# Patient Record
Sex: Female | Born: 1955 | Race: Black or African American | Hispanic: No | Smoking: Current every day smoker
Health system: Southern US, Community
[De-identification: ages and names within clinical notes are randomized; demographics above are authoritative.]

## PROBLEM LIST (undated history)

## (undated) DIAGNOSIS — F419 Anxiety disorder, unspecified: Secondary | ICD-10-CM

## (undated) DIAGNOSIS — M1990 Unspecified osteoarthritis, unspecified site: Secondary | ICD-10-CM

## (undated) DIAGNOSIS — M1611 Unilateral primary osteoarthritis, right hip: Secondary | ICD-10-CM

## (undated) DIAGNOSIS — F32A Depression, unspecified: Secondary | ICD-10-CM

## (undated) DIAGNOSIS — F1290 Cannabis use, unspecified, uncomplicated: Secondary | ICD-10-CM

## (undated) DIAGNOSIS — M25519 Pain in unspecified shoulder: Secondary | ICD-10-CM

## (undated) DIAGNOSIS — M25559 Pain in unspecified hip: Secondary | ICD-10-CM

## (undated) DIAGNOSIS — F1410 Cocaine abuse, uncomplicated: Secondary | ICD-10-CM

## (undated) DIAGNOSIS — R9431 Abnormal electrocardiogram [ECG] [EKG]: Secondary | ICD-10-CM

## (undated) DIAGNOSIS — J45909 Unspecified asthma, uncomplicated: Secondary | ICD-10-CM

## (undated) DIAGNOSIS — E43 Unspecified severe protein-calorie malnutrition: Secondary | ICD-10-CM

## (undated) DIAGNOSIS — K219 Gastro-esophageal reflux disease without esophagitis: Secondary | ICD-10-CM

## (undated) DIAGNOSIS — O009 Unspecified ectopic pregnancy without intrauterine pregnancy: Secondary | ICD-10-CM

## (undated) DIAGNOSIS — F17200 Nicotine dependence, unspecified, uncomplicated: Secondary | ICD-10-CM

## (undated) DIAGNOSIS — F319 Bipolar disorder, unspecified: Secondary | ICD-10-CM

## (undated) HISTORY — DX: Cannabis use, unspecified, uncomplicated: F12.90

## (undated) HISTORY — DX: Pain in unspecified shoulder: M25.519

## (undated) HISTORY — DX: Nicotine dependence, unspecified, uncomplicated: F17.200

## (undated) HISTORY — DX: Unspecified severe protein-calorie malnutrition: E43

## (undated) HISTORY — PX: ABDOMINAL SURGERY: SHX537

## (undated) HISTORY — PX: LAPAROSCOPY FOR ECTOPIC PREGNANCY: SUR765

## (undated) HISTORY — DX: Pain in unspecified hip: M25.559

## (undated) HISTORY — PX: TONSILLECTOMY: SUR1361

## (undated) HISTORY — PX: TUMOR REMOVAL: SHX12

## (undated) HISTORY — DX: Abnormal electrocardiogram (ECG) (EKG): R94.31

## (undated) HISTORY — DX: Cocaine abuse, uncomplicated: F14.10

---

## 1988-06-15 HISTORY — PX: TUMOR REMOVAL: SHX12

## 1998-01-03 ENCOUNTER — Other Ambulatory Visit: Admission: RE | Admit: 1998-01-03 | Discharge: 1998-01-03 | Payer: Self-pay | Admitting: Family Medicine

## 1998-07-23 ENCOUNTER — Encounter: Admission: RE | Admit: 1998-07-23 | Discharge: 1998-07-23 | Payer: Self-pay | Admitting: Obstetrics & Gynecology

## 1998-08-06 ENCOUNTER — Encounter: Admission: RE | Admit: 1998-08-06 | Discharge: 1998-08-06 | Payer: Self-pay | Admitting: Obstetrics & Gynecology

## 1998-09-23 ENCOUNTER — Emergency Department (HOSPITAL_COMMUNITY): Admission: EM | Admit: 1998-09-23 | Discharge: 1998-09-24 | Payer: Self-pay | Admitting: Emergency Medicine

## 1998-09-23 ENCOUNTER — Encounter: Payer: Self-pay | Admitting: Emergency Medicine

## 1998-09-26 ENCOUNTER — Emergency Department (HOSPITAL_COMMUNITY): Admission: EM | Admit: 1998-09-26 | Discharge: 1998-09-26 | Payer: Self-pay

## 1999-05-23 ENCOUNTER — Inpatient Hospital Stay (HOSPITAL_COMMUNITY): Admission: AD | Admit: 1999-05-23 | Discharge: 1999-05-30 | Payer: Self-pay

## 2000-07-26 ENCOUNTER — Emergency Department (HOSPITAL_COMMUNITY): Admission: EM | Admit: 2000-07-26 | Discharge: 2000-07-26 | Payer: Self-pay | Admitting: Emergency Medicine

## 2000-11-06 ENCOUNTER — Encounter: Payer: Self-pay | Admitting: Emergency Medicine

## 2000-11-06 ENCOUNTER — Emergency Department (HOSPITAL_COMMUNITY): Admission: EM | Admit: 2000-11-06 | Discharge: 2000-11-06 | Payer: Self-pay | Admitting: Emergency Medicine

## 2001-12-18 ENCOUNTER — Emergency Department (HOSPITAL_COMMUNITY): Admission: EM | Admit: 2001-12-18 | Discharge: 2001-12-18 | Payer: Self-pay | Admitting: Emergency Medicine

## 2001-12-19 ENCOUNTER — Encounter: Payer: Self-pay | Admitting: Emergency Medicine

## 2002-06-19 ENCOUNTER — Emergency Department (HOSPITAL_COMMUNITY): Admission: EM | Admit: 2002-06-19 | Discharge: 2002-06-19 | Payer: Self-pay | Admitting: Emergency Medicine

## 2002-06-19 ENCOUNTER — Encounter: Payer: Self-pay | Admitting: Emergency Medicine

## 2003-09-24 ENCOUNTER — Emergency Department (HOSPITAL_COMMUNITY): Admission: EM | Admit: 2003-09-24 | Discharge: 2003-09-24 | Payer: Self-pay | Admitting: Emergency Medicine

## 2004-07-07 ENCOUNTER — Ambulatory Visit: Payer: Self-pay | Admitting: Family Medicine

## 2005-07-30 ENCOUNTER — Ambulatory Visit: Payer: Self-pay | Admitting: Family Medicine

## 2005-08-03 ENCOUNTER — Ambulatory Visit: Payer: Self-pay | Admitting: Family Medicine

## 2005-08-05 ENCOUNTER — Ambulatory Visit (HOSPITAL_COMMUNITY): Admission: RE | Admit: 2005-08-05 | Discharge: 2005-08-05 | Payer: Self-pay | Admitting: Internal Medicine

## 2005-08-06 ENCOUNTER — Ambulatory Visit: Payer: Self-pay | Admitting: Family Medicine

## 2005-09-02 ENCOUNTER — Ambulatory Visit: Payer: Self-pay | Admitting: Family Medicine

## 2006-01-27 ENCOUNTER — Ambulatory Visit: Payer: Self-pay | Admitting: Family Medicine

## 2006-12-09 ENCOUNTER — Emergency Department (HOSPITAL_COMMUNITY): Admission: EM | Admit: 2006-12-09 | Discharge: 2006-12-09 | Payer: Self-pay | Admitting: Emergency Medicine

## 2006-12-23 ENCOUNTER — Encounter: Admission: RE | Admit: 2006-12-23 | Discharge: 2006-12-23 | Payer: Self-pay | Admitting: General Practice

## 2007-09-03 ENCOUNTER — Emergency Department (HOSPITAL_COMMUNITY): Admission: EM | Admit: 2007-09-03 | Discharge: 2007-09-03 | Payer: Self-pay | Admitting: Emergency Medicine

## 2007-09-15 ENCOUNTER — Emergency Department (HOSPITAL_COMMUNITY): Admission: EM | Admit: 2007-09-15 | Discharge: 2007-09-15 | Payer: Self-pay | Admitting: Emergency Medicine

## 2007-10-06 ENCOUNTER — Emergency Department (HOSPITAL_COMMUNITY): Admission: EM | Admit: 2007-10-06 | Discharge: 2007-10-06 | Payer: Self-pay | Admitting: Family Medicine

## 2007-10-23 ENCOUNTER — Emergency Department (HOSPITAL_COMMUNITY): Admission: EM | Admit: 2007-10-23 | Discharge: 2007-10-23 | Payer: Self-pay | Admitting: Emergency Medicine

## 2007-12-06 ENCOUNTER — Emergency Department (HOSPITAL_COMMUNITY): Admission: EM | Admit: 2007-12-06 | Discharge: 2007-12-06 | Payer: Self-pay | Admitting: Emergency Medicine

## 2008-01-09 ENCOUNTER — Emergency Department (HOSPITAL_COMMUNITY): Admission: EM | Admit: 2008-01-09 | Discharge: 2008-01-09 | Payer: Self-pay | Admitting: Emergency Medicine

## 2008-01-28 ENCOUNTER — Emergency Department (HOSPITAL_COMMUNITY): Admission: EM | Admit: 2008-01-28 | Discharge: 2008-01-28 | Payer: Self-pay | Admitting: Family Medicine

## 2008-10-10 ENCOUNTER — Emergency Department (HOSPITAL_COMMUNITY): Admission: EM | Admit: 2008-10-10 | Discharge: 2008-10-10 | Payer: Self-pay | Admitting: Emergency Medicine

## 2009-01-02 ENCOUNTER — Emergency Department (HOSPITAL_COMMUNITY): Admission: EM | Admit: 2009-01-02 | Discharge: 2009-01-02 | Payer: Self-pay | Admitting: Emergency Medicine

## 2009-01-30 ENCOUNTER — Emergency Department (HOSPITAL_COMMUNITY): Admission: EM | Admit: 2009-01-30 | Discharge: 2009-01-30 | Payer: Self-pay | Admitting: Family Medicine

## 2009-05-06 ENCOUNTER — Emergency Department (HOSPITAL_COMMUNITY): Admission: EM | Admit: 2009-05-06 | Discharge: 2009-05-07 | Payer: Self-pay | Admitting: Emergency Medicine

## 2009-06-21 ENCOUNTER — Emergency Department (HOSPITAL_COMMUNITY): Admission: EM | Admit: 2009-06-21 | Discharge: 2009-06-21 | Payer: Self-pay | Admitting: Emergency Medicine

## 2009-07-08 ENCOUNTER — Emergency Department (HOSPITAL_COMMUNITY): Admission: EM | Admit: 2009-07-08 | Discharge: 2009-07-08 | Payer: Self-pay | Admitting: Emergency Medicine

## 2009-11-17 ENCOUNTER — Other Ambulatory Visit: Payer: Self-pay | Admitting: Emergency Medicine

## 2009-11-17 ENCOUNTER — Ambulatory Visit: Payer: Self-pay | Admitting: Psychiatry

## 2009-11-17 ENCOUNTER — Inpatient Hospital Stay (HOSPITAL_COMMUNITY): Admission: RE | Admit: 2009-11-17 | Discharge: 2009-11-25 | Payer: Self-pay | Admitting: Psychiatry

## 2009-11-18 ENCOUNTER — Encounter: Payer: Self-pay | Admitting: Emergency Medicine

## 2010-03-19 ENCOUNTER — Ambulatory Visit (HOSPITAL_COMMUNITY): Admission: RE | Admit: 2010-03-19 | Discharge: 2010-03-19 | Payer: Self-pay | Admitting: Internal Medicine

## 2010-04-23 IMAGING — CR DG CERVICAL SPINE COMPLETE 4+V
7 series · 7 of 7 positions shown · non-contrast
Comparison: None

CLINICAL DATA: Fall 3 days ago.

CERVICAL SPINE - COMPLETE 4+ VIEW

[view not recorded (1 of 7)]
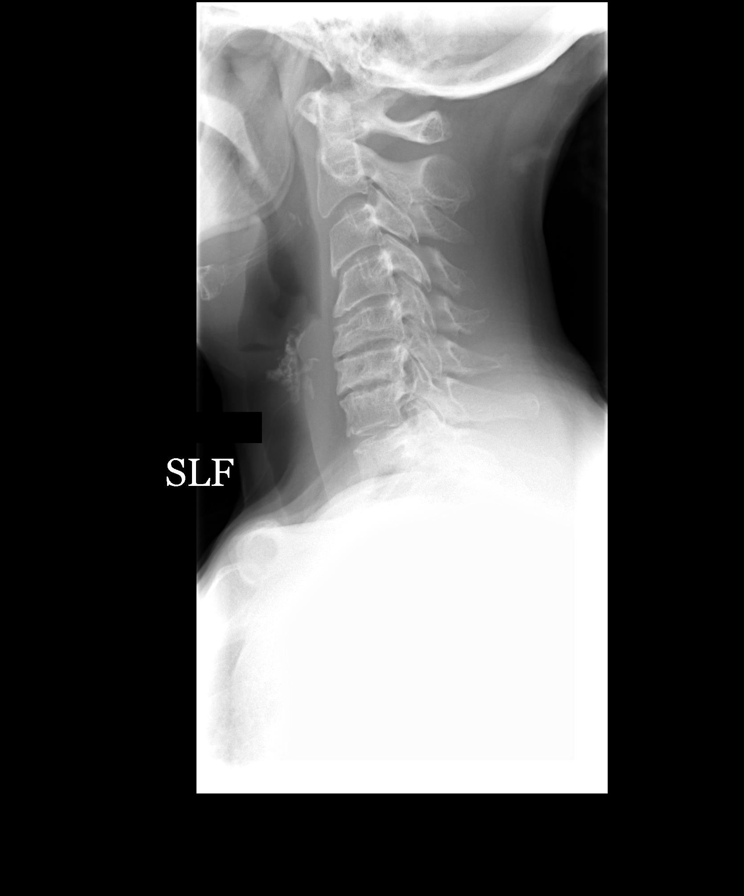

[view not recorded (2 of 7)]
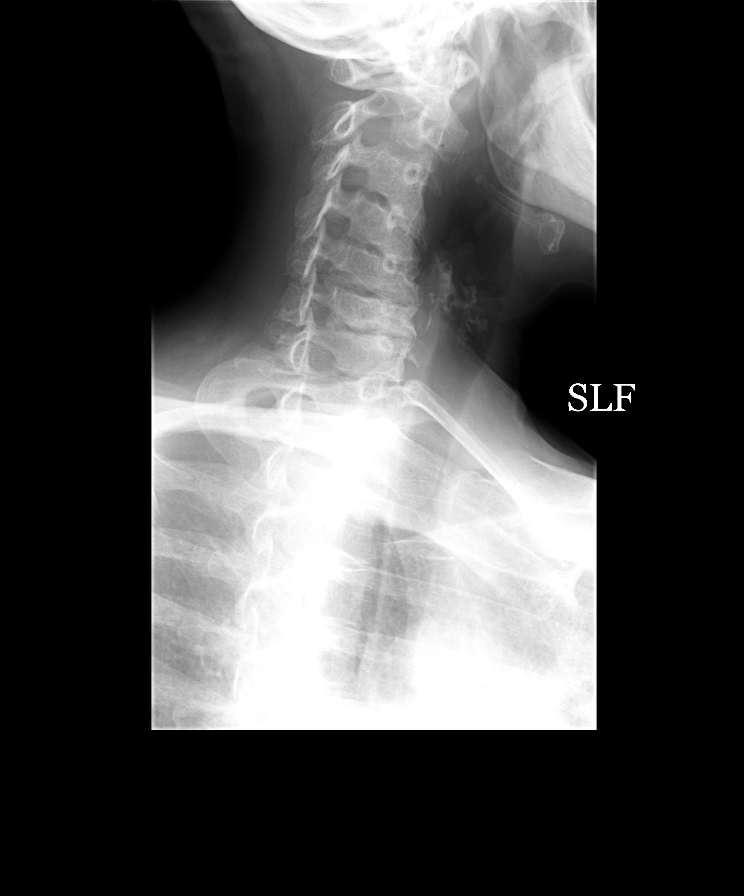

[view not recorded (3 of 7)]
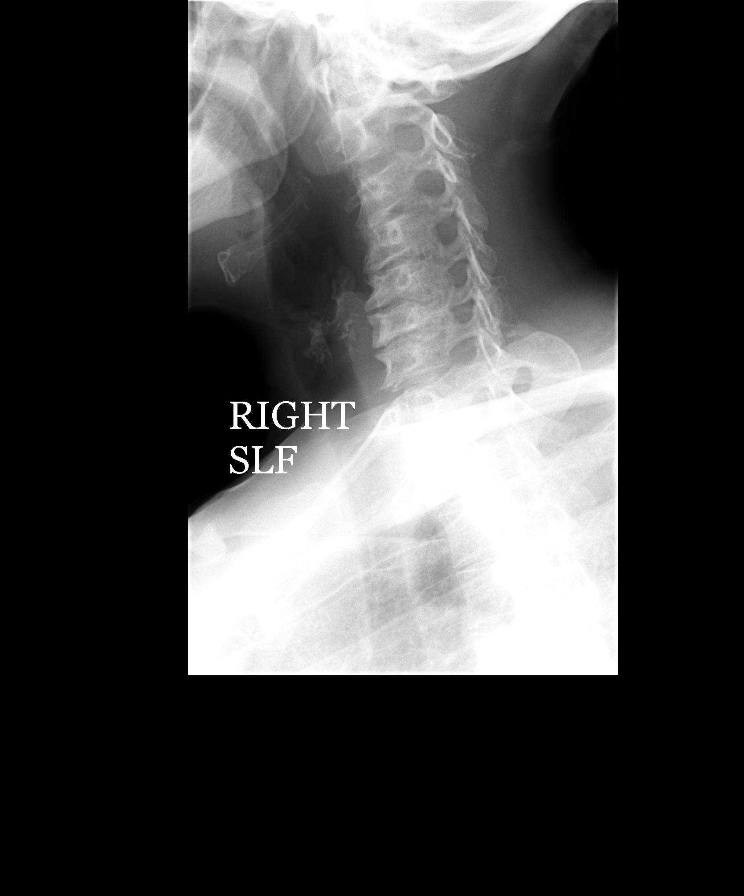

[view not recorded (4 of 7)]
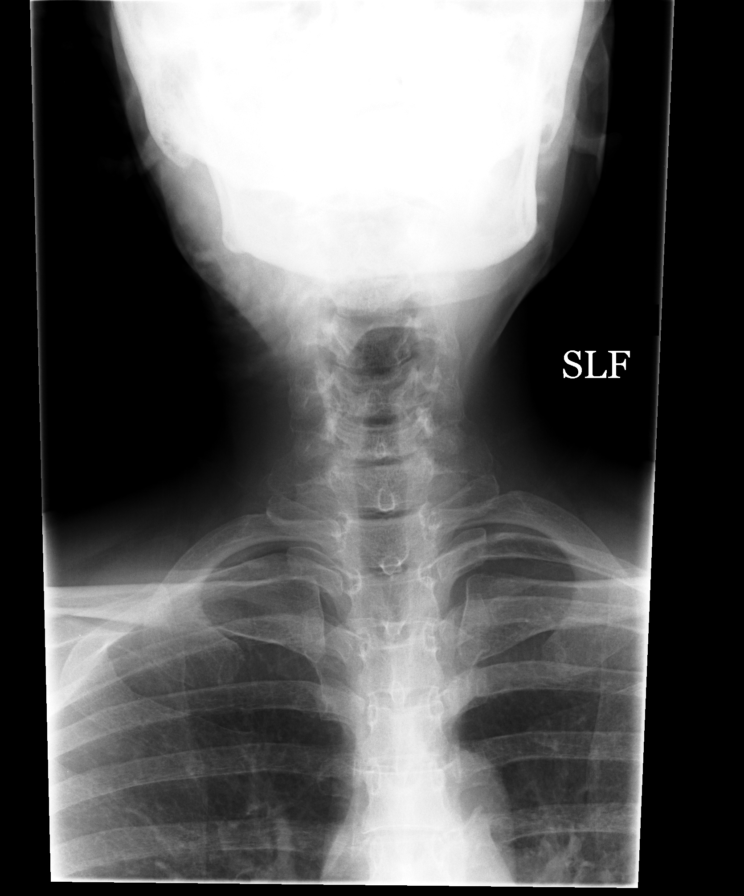

[view not recorded (5 of 7)]
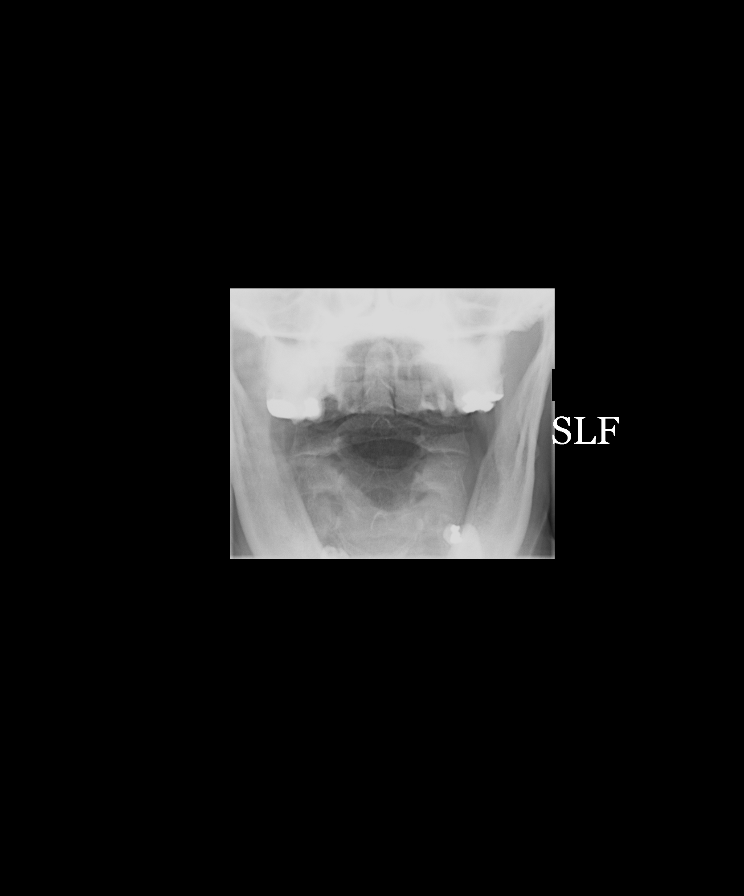

[view not recorded (6 of 7)]
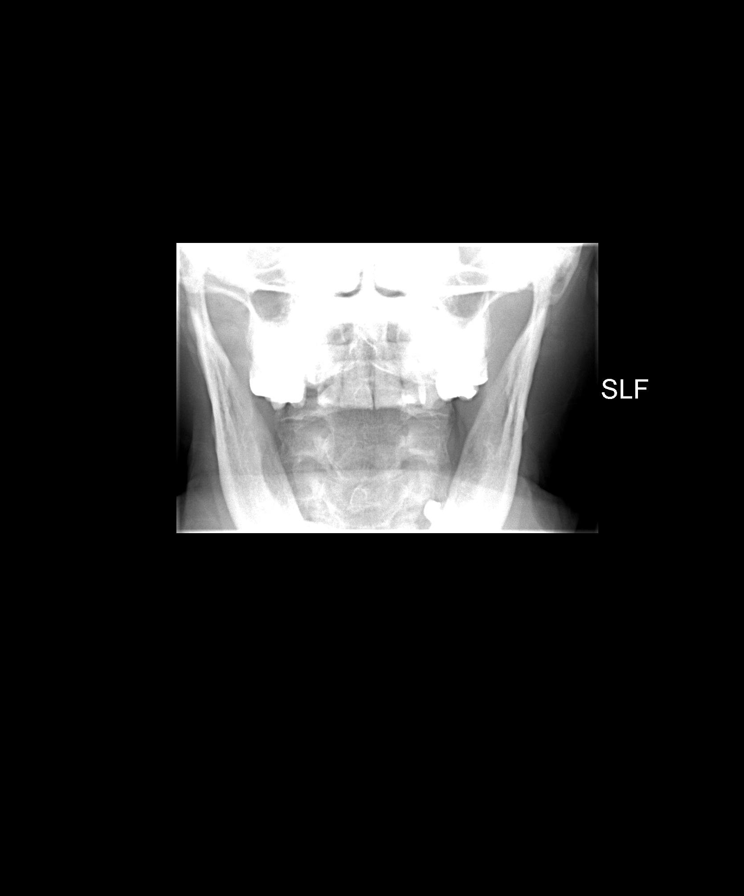

[view not recorded (7 of 7)]
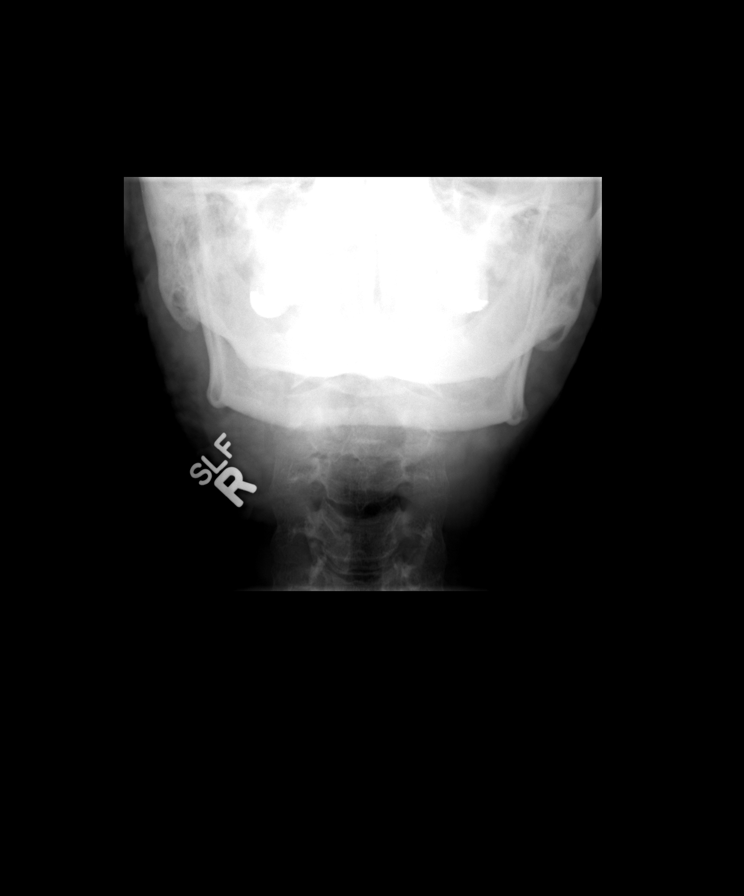

[7 of 7 positions shown; findings below may reference images not displayed]

FINDINGS: Negative for fracture.  There is disc degeneration and
spondylosis most notable at C5-6 and C6-7 and milder at C3-4 and C4-
5.  There is mild foraminal narrowing bilaterally at C5-6 and C6-7
due to spurring.
IMPRESSION: Negative for fracture.  Moderate cervical spondylosis.

## 2010-07-06 ENCOUNTER — Encounter: Payer: Self-pay | Admitting: General Practice

## 2010-07-17 ENCOUNTER — Emergency Department (HOSPITAL_COMMUNITY)
Admission: EM | Admit: 2010-07-17 | Discharge: 2010-07-17 | Disposition: A | Discharge status: Home / Self Care | Payer: Medicare Other | Attending: Emergency Medicine | Admitting: Emergency Medicine

## 2010-07-17 DIAGNOSIS — F341 Dysthymic disorder: Secondary | ICD-10-CM | POA: Insufficient documentation

## 2010-07-17 DIAGNOSIS — Z79899 Other long term (current) drug therapy: Secondary | ICD-10-CM | POA: Insufficient documentation

## 2010-07-17 DIAGNOSIS — Z760 Encounter for issue of repeat prescription: Secondary | ICD-10-CM | POA: Insufficient documentation

## 2010-07-17 DIAGNOSIS — F319 Bipolar disorder, unspecified: Secondary | ICD-10-CM | POA: Insufficient documentation

## 2010-08-30 LAB — COMPREHENSIVE METABOLIC PANEL
ALT: 13 U/L (ref 0–35)
AST: 20 U/L (ref 0–37)
Albumin: 3.4 g/dL — ABNORMAL LOW (ref 3.5–5.2)
Alkaline Phosphatase: 66 U/L (ref 39–117)
BUN: 7 mg/dL (ref 6–23)
CO2: 30 mEq/L (ref 19–32)
Calcium: 9 mg/dL (ref 8.4–10.5)
Chloride: 104 mEq/L (ref 96–112)
Creatinine, Ser: 0.91 mg/dL (ref 0.4–1.2)
GFR calc Af Amer: >60 mL/min (ref >60)
GFR calc non Af Amer: >60 mL/min (ref >60)
Glucose, Bld: 93 mg/dL (ref 70–99)
Potassium: 3.6 mEq/L (ref 3.5–5.1)
Sodium: 140 mEq/L (ref 135–145)
Total Bilirubin: 0.4 mg/dL (ref 0.3–1.2)
Total Protein: 6.6 g/dL (ref 6.0–8.3)

## 2010-08-30 LAB — URINE MICROSCOPIC-ADD ON

## 2010-08-30 LAB — CBC
HCT: 39.5 % (ref 36.0–46.0)
Hemoglobin: 13.5 g/dL (ref 12.0–15.0)
MCHC: 34.2 g/dL (ref 30.0–36.0)
MCV: 95.4 fL (ref 78.0–100.0)
Platelets: 184 10*3/uL (ref 150–400)
RBC: 4.13 MIL/uL (ref 3.87–5.11)
RDW: 14.5 % (ref 11.5–15.5)
WBC: 3.8 10*3/uL — ABNORMAL LOW (ref 4.0–10.5)

## 2010-08-30 LAB — URINALYSIS, ROUTINE W REFLEX MICROSCOPIC
Bilirubin Urine: NEGATIVE
Glucose, UA: NEGATIVE mg/dL
Ketones, ur: NEGATIVE mg/dL
Leukocytes, UA: NEGATIVE
Nitrite: NEGATIVE
Protein, ur: NEGATIVE mg/dL
Specific Gravity, Urine: 1.013 (ref 1.005–1.030)
Urobilinogen, UA: 1 mg/dL (ref 0.0–1.0)
pH: 6 (ref 5.0–8.0)

## 2010-08-30 LAB — RAPID URINE DRUG SCREEN, HOSP PERFORMED
Amphetamines: NOT DETECTED
Barbiturates: NOT DETECTED
Benzodiazepines: NOT DETECTED
Cocaine: POSITIVE — AB
Opiates: NOT DETECTED
Tetrahydrocannabinol: NOT DETECTED

## 2010-08-30 LAB — DIFFERENTIAL
Basophils Absolute: 0 10*3/uL (ref 0.0–0.1)
Basophils Relative: 1 % (ref 0–1)
Eosinophils Absolute: 0 10*3/uL (ref 0.0–0.7)
Eosinophils Relative: 1 % (ref 0–5)
Lymphocytes Relative: 33 % (ref 12–46)
Lymphs Abs: 1.2 10*3/uL (ref 0.7–4.0)
Monocytes Absolute: 0.3 10*3/uL (ref 0.1–1.0)
Monocytes Relative: 9 % (ref 3–12)
Neutro Abs: 2.1 10*3/uL (ref 1.7–7.7)
Neutrophils Relative %: 56 % (ref 43–77)

## 2010-08-30 LAB — ETHANOL: Alcohol, Ethyl (B): <5 mg/dL (ref 0–10)

## 2010-08-31 LAB — URINALYSIS, ROUTINE W REFLEX MICROSCOPIC
Glucose, UA: NEGATIVE mg/dL
Hgb urine dipstick: NEGATIVE
Ketones, ur: 15 mg/dL — AB
Nitrite: NEGATIVE
Protein, ur: NEGATIVE mg/dL
Specific Gravity, Urine: 1.031 — ABNORMAL HIGH (ref 1.005–1.030)
Urobilinogen, UA: 1 mg/dL (ref 0.0–1.0)
pH: 6 (ref 5.0–8.0)

## 2010-08-31 LAB — WET PREP, GENITAL
Trich, Wet Prep: NONE SEEN
Yeast Wet Prep HPF POC: NONE SEEN

## 2010-08-31 LAB — GC/CHLAMYDIA PROBE AMP, GENITAL
Chlamydia, DNA Probe: NEGATIVE
GC Probe Amp, Genital: POSITIVE — AB

## 2010-09-01 LAB — POCT I-STAT, CHEM 8
BUN: 16 mg/dL (ref 6–23)
Calcium, Ion: 1.12 mmol/L (ref 1.12–1.32)
Chloride: 109 mEq/L (ref 96–112)
Creatinine, Ser: 0.9 mg/dL (ref 0.4–1.2)
Glucose, Bld: 96 mg/dL (ref 70–99)
HCT: 43 % (ref 36.0–46.0)
Hemoglobin: 14.6 g/dL (ref 12.0–15.0)
Potassium: 4.2 mEq/L (ref 3.5–5.1)
Sodium: 140 mEq/L (ref 135–145)
TCO2: 25 mmol/L (ref 0–100)

## 2010-09-01 LAB — ETHANOL: Alcohol, Ethyl (B): <5 mg/dL (ref 0–10)

## 2010-09-01 LAB — URINALYSIS, ROUTINE W REFLEX MICROSCOPIC
Bilirubin Urine: NEGATIVE
Glucose, UA: NEGATIVE mg/dL
Hgb urine dipstick: NEGATIVE
Ketones, ur: NEGATIVE mg/dL
Nitrite: NEGATIVE
Protein, ur: NEGATIVE mg/dL
Specific Gravity, Urine: 1.028 (ref 1.005–1.030)
Urobilinogen, UA: 0.2 mg/dL (ref 0.0–1.0)
pH: 5.5 (ref 5.0–8.0)

## 2010-09-01 LAB — DIFFERENTIAL
Basophils Absolute: 0 10*3/uL (ref 0.0–0.1)
Basophils Absolute: 0 10*3/uL (ref 0.0–0.1)
Basophils Relative: 1 % (ref 0–1)
Basophils Relative: 1 % (ref 0–1)
Eosinophils Absolute: 0.1 10*3/uL (ref 0.0–0.7)
Eosinophils Absolute: 0.1 10*3/uL (ref 0.0–0.7)
Eosinophils Relative: 2 % (ref 0–5)
Eosinophils Relative: 2 % (ref 0–5)
Lymphocytes Relative: 42 % (ref 12–46)
Lymphocytes Relative: 49 % — ABNORMAL HIGH (ref 12–46)
Lymphs Abs: 1.7 10*3/uL (ref 0.7–4.0)
Lymphs Abs: 1.8 10*3/uL (ref 0.7–4.0)
Monocytes Absolute: 0.3 10*3/uL (ref 0.1–1.0)
Monocytes Absolute: 0.4 10*3/uL (ref 0.1–1.0)
Monocytes Relative: 9 % (ref 3–12)
Monocytes Relative: 9 % (ref 3–12)
Neutro Abs: 1.5 10*3/uL — ABNORMAL LOW (ref 1.7–7.7)
Neutro Abs: 2 10*3/uL (ref 1.7–7.7)
Neutrophils Relative %: 39 % — ABNORMAL LOW (ref 43–77)
Neutrophils Relative %: 47 % (ref 43–77)

## 2010-09-01 LAB — COMPREHENSIVE METABOLIC PANEL
ALT: 16 U/L (ref 0–35)
AST: 23 U/L (ref 0–37)
Albumin: 3.5 g/dL (ref 3.5–5.2)
Alkaline Phosphatase: 66 U/L (ref 39–117)
BUN: 17 mg/dL (ref 6–23)
CO2: 29 mEq/L (ref 19–32)
Calcium: 9.1 mg/dL (ref 8.4–10.5)
Chloride: 109 mEq/L (ref 96–112)
Creatinine, Ser: 1.08 mg/dL (ref 0.4–1.2)
GFR calc Af Amer: >60 mL/min (ref >60)
GFR calc non Af Amer: 53 mL/min — ABNORMAL LOW (ref >60)
Glucose, Bld: 88 mg/dL (ref 70–99)
Potassium: 3.7 mEq/L (ref 3.5–5.1)
Sodium: 143 mEq/L (ref 135–145)
Total Bilirubin: 0.3 mg/dL (ref 0.3–1.2)
Total Protein: 6.6 g/dL (ref 6.0–8.3)

## 2010-09-01 LAB — D-DIMER, QUANTITATIVE: D-Dimer, Quant: 0.28 ug/mL-FEU (ref 0.00–0.48)

## 2010-09-01 LAB — CBC
HCT: 40.9 % (ref 36.0–46.0)
HCT: 41.4 % (ref 36.0–46.0)
Hemoglobin: 13.7 g/dL (ref 12.0–15.0)
Hemoglobin: 14.2 g/dL (ref 12.0–15.0)
MCHC: 33.4 g/dL (ref 30.0–36.0)
MCHC: 34.4 g/dL (ref 30.0–36.0)
MCV: 96.5 fL (ref 78.0–100.0)
MCV: 96.8 fL (ref 78.0–100.0)
Platelets: 174 10*3/uL (ref 150–400)
Platelets: 184 10*3/uL (ref 150–400)
RBC: 4.23 MIL/uL (ref 3.87–5.11)
RBC: 4.29 MIL/uL (ref 3.87–5.11)
RDW: 14.3 % (ref 11.5–15.5)
RDW: 14.7 % (ref 11.5–15.5)
WBC: 3.7 10*3/uL — ABNORMAL LOW (ref 4.0–10.5)
WBC: 4.2 10*3/uL (ref 4.0–10.5)

## 2010-09-01 LAB — URINE MICROSCOPIC-ADD ON

## 2010-09-01 LAB — URINE CULTURE
Colony Count: 25000
Special Requests: NEGATIVE

## 2010-09-01 LAB — RAPID URINE DRUG SCREEN, HOSP PERFORMED
Amphetamines: NOT DETECTED
Barbiturates: NOT DETECTED
Benzodiazepines: NOT DETECTED
Cocaine: POSITIVE — AB
Opiates: NOT DETECTED
Tetrahydrocannabinol: NOT DETECTED

## 2010-09-01 LAB — POCT CARDIAC MARKERS
CKMB, poc: <1 ng/mL — ABNORMAL LOW (ref 1.0–8.0)
Myoglobin, poc: 38.5 ng/mL (ref 12–200)
Troponin i, poc: <0.05 ng/mL (ref 0.00–0.09)

## 2010-09-01 LAB — GC/CHLAMYDIA PROBE AMP, URINE
Chlamydia, Swab/Urine, PCR: NEGATIVE
GC Probe Amp, Urine: NEGATIVE

## 2010-09-17 LAB — RAPID URINE DRUG SCREEN, HOSP PERFORMED
Amphetamines: NOT DETECTED
Barbiturates: NOT DETECTED
Benzodiazepines: NOT DETECTED
Cocaine: POSITIVE — AB
Opiates: NOT DETECTED
Tetrahydrocannabinol: NOT DETECTED

## 2010-09-17 LAB — BASIC METABOLIC PANEL
BUN: 10 mg/dL (ref 6–23)
CO2: 28 mEq/L (ref 19–32)
Chloride: 107 mEq/L (ref 96–112)
Potassium: 3.7 mEq/L (ref 3.5–5.1)

## 2010-09-17 LAB — BASIC METABOLIC PANEL WITH GFR
Calcium: 9.1 mg/dL (ref 8.4–10.5)
Creatinine, Ser: 0.83 mg/dL (ref 0.4–1.2)
GFR calc Af Amer: >60 mL/min (ref >60)
GFR calc non Af Amer: >60 mL/min (ref >60)
Glucose, Bld: 85 mg/dL (ref 70–99)
Sodium: 141 meq/L (ref 135–145)

## 2010-09-17 LAB — CBC
HCT: 38.6 % (ref 36.0–46.0)
Hemoglobin: 13.1 g/dL (ref 12.0–15.0)
MCHC: 34 g/dL (ref 30.0–36.0)
MCV: 95.1 fL (ref 78.0–100.0)
Platelets: 199 10*3/uL (ref 150–400)
RBC: 4.06 MIL/uL (ref 3.87–5.11)
RDW: 14 % (ref 11.5–15.5)
WBC: 4.6 10*3/uL (ref 4.0–10.5)

## 2010-09-17 LAB — DIFFERENTIAL
Basophils Absolute: 0 K/uL (ref 0.0–0.1)
Basophils Relative: 1 % (ref 0–1)
Eosinophils Absolute: 0.1 10*3/uL (ref 0.0–0.7)
Eosinophils Relative: 1 % (ref 0–5)
Lymphocytes Relative: 46 % (ref 12–46)
Lymphs Abs: 2.1 10*3/uL (ref 0.7–4.0)
Monocytes Absolute: 0.3 K/uL (ref 0.1–1.0)
Monocytes Relative: 8 % (ref 3–12)
Neutro Abs: 2 K/uL (ref 1.7–7.7)
Neutrophils Relative %: 45 % (ref 43–77)

## 2010-09-17 LAB — ETHANOL: Alcohol, Ethyl (B): <5 mg/dL (ref 0–10)

## 2010-09-24 LAB — URINALYSIS, ROUTINE W REFLEX MICROSCOPIC
Nitrite: NEGATIVE
Specific Gravity, Urine: 1.023 (ref 1.005–1.030)
Urobilinogen, UA: 1 mg/dL (ref 0.0–1.0)

## 2010-09-24 LAB — POCT I-STAT, CHEM 8
Chloride: 109 mEq/L (ref 96–112)
Creatinine, Ser: 1.2 mg/dL (ref 0.4–1.2)
Glucose, Bld: 76 mg/dL (ref 70–99)
HCT: 41 % (ref 36.0–46.0)
Potassium: 3.7 mEq/L (ref 3.5–5.1)

## 2010-09-24 LAB — DIFFERENTIAL
Basophils Relative: 1 % (ref 0–1)
Lymphocytes Relative: 45 % (ref 12–46)
Lymphs Abs: 2 10*3/uL (ref 0.7–4.0)
Monocytes Relative: 10 % (ref 3–12)
Neutro Abs: 2 10*3/uL (ref 1.7–7.7)
Neutrophils Relative %: 44 % (ref 43–77)

## 2010-09-24 LAB — CBC
RBC: 4.14 MIL/uL (ref 3.87–5.11)
WBC: 4.5 10*3/uL (ref 4.0–10.5)

## 2011-08-29 ENCOUNTER — Emergency Department (HOSPITAL_COMMUNITY)
Admission: EM | Admit: 2011-08-29 | Discharge: 2011-08-29 | Disposition: A | Discharge status: Home / Self Care | Payer: Medicare Other | Attending: Emergency Medicine | Admitting: Emergency Medicine

## 2011-08-29 ENCOUNTER — Encounter (HOSPITAL_COMMUNITY): Payer: Self-pay | Admitting: *Deleted

## 2011-08-29 DIAGNOSIS — F17200 Nicotine dependence, unspecified, uncomplicated: Secondary | ICD-10-CM | POA: Insufficient documentation

## 2011-08-29 DIAGNOSIS — N390 Urinary tract infection, site not specified: Secondary | ICD-10-CM

## 2011-08-29 DIAGNOSIS — H6690 Otitis media, unspecified, unspecified ear: Secondary | ICD-10-CM

## 2011-08-29 LAB — URINALYSIS, ROUTINE W REFLEX MICROSCOPIC
Nitrite: POSITIVE — AB
Specific Gravity, Urine: 1.02 (ref 1.005–1.030)
pH: 6.5 (ref 5.0–8.0)

## 2011-08-29 LAB — URINE MICROSCOPIC-ADD ON

## 2011-08-29 MED ORDER — SULFAMETHOXAZOLE-TRIMETHOPRIM 800-160 MG PO TABS: 1.0000 | ORAL_TABLET | Freq: Two times a day (BID) | ORAL | Status: AC

## 2011-08-29 MED ORDER — HYDROCODONE-ACETAMINOPHEN 5-500 MG PO TABS: 1.0000 | ORAL_TABLET | Freq: Four times a day (QID) | ORAL | Status: AC | PRN

## 2011-08-29 NOTE — ED Provider Notes (Signed)
History     CSN: [id number]  Arrival date & time 08/29/11  1605   First MD Initiated Contact with Patient 08/29/11 1756      Chief Complaint  Patient presents with  . Otalgia  . Fever  . Hematuria    (Consider location/radiation/quality/duration/timing/severity/associated sxs/prior treatment) HPI Comments: Also complains of burning with urination, blood in urine, and lower abdominal pain.  Thinks she may have a uti.  Patient is a 56 y.o. female presenting with ear pain. The history is provided by the patient.  Otalgia This is a new problem. The current episode started 2 days ago. There is pain in the left ear. The problem occurs constantly. The problem has been gradually worsening. There has been no fever.    History reviewed. No pertinent past medical history.  History reviewed. No pertinent past surgical history.  History reviewed. No pertinent family history.  History  Substance Use Topics  . Smoking status: Current Everyday Smoker    Types: Cigarettes, Cigars  . Smokeless tobacco: Not on file  . Alcohol Use: Yes     occ    OB History    Grav Para Term Preterm Abortions TAB SAB Ect Mult Living                  Review of Systems  HENT: Positive for ear pain.   All other systems reviewed and are negative.    Allergies  Review of patient's allergies indicates no known allergies.  Home Medications   Current Outpatient Rx  Name Route Sig Dispense Refill  . CLONAZEPAM 0.5 MG PO TABS Oral Take 0.5 mg by mouth 2 (two) times daily.    Marland Kitchen GABAPENTIN 300 MG PO CAPS Oral Take 300 mg by mouth 3 (three) times daily.    Marland Kitchen OXCARBAZEPINE 300 MG PO TABS Oral Take 300 mg by mouth 3 (three) times daily.    Marland Kitchen RISPERIDONE 1 MG PO TABS Oral Take 1 mg by mouth 2 (two) times daily.      BP 121/79  Pulse 77  Temp(Src) 98.1 F (36.7 C) (Oral)  Resp 16  SpO2 97%  Physical Exam  Nursing note and vitals reviewed. Constitutional: She is oriented to person, place, and time.  She appears well-developed and well-nourished. No distress.  HENT:  Head: Normocephalic and atraumatic.       The left tm is erythematous and bulging.    Neck: Normal range of motion. Neck supple.  Cardiovascular: Normal rate and regular rhythm.  Exam reveals no gallop and no friction rub.   No murmur heard. Pulmonary/Chest: Effort normal and breath sounds normal. No respiratory distress. She has no wheezes.  Abdominal: Soft. Bowel sounds are normal. She exhibits no distension.       There is mild ttp in the suprapubic region with no rebound or guarding.  Musculoskeletal: Normal range of motion.  Neurological: She is alert and oriented to person, place, and time.  Skin: Skin is warm and dry. She is not diaphoretic.    ED Course  Procedures (including critical care time)   Labs Reviewed  URINALYSIS, ROUTINE W REFLEX MICROSCOPIC   No results found.   No diagnosis found.    MDM  The patient has both an otitis media and uti.  I will treat her with bactrim.  To return prn if worsens.          Geoffery Lyons, MD 08/29/11 [phone]

## 2011-08-29 NOTE — Discharge Instructions (Signed)
Otitis Media, Adult A middle ear infection is an infection in the space behind the eardrum. The medical name for this is "otitis media." It may happen after a common cold. It is caused by a germ that starts growing in that space. You may feel swollen glands in your neck on the side of the ear infection. HOME CARE INSTRUCTIONS   Take your medicine as directed until it is gone, even if you feel better after the first few days.   Only take over-the-counter or prescription medicines for pain, discomfort, or fever as directed by your caregiver.   Occasional use of a nasal decongestant a couple times per day may help with discomfort and help the eustachian tube to drain better.  Follow up with your caregiver in 10 to 14 days or as directed, to be certain that the infection has cleared. Not keeping the appointment could result in a chronic or permanent injury, pain, hearing loss and disability. If there is any problem keeping the appointment, you must call back to this facility for assistance. SEEK IMMEDIATE MEDICAL CARE IF:   You are not getting better in 2 to 3 days.   You have pain that is not controlled with medication.   You feel worse instead of better.   You cannot use the medication as directed.   You develop swelling, redness or pain around the ear or stiffness in your neck.  MAKE SURE YOU:   Understand these instructions.   Will watch your condition.   Will get help right away if you are not doing well or get worse.  Document Released: 03/06/2004 Document Revised: 05/21/2011 Document Reviewed: 01/06/2008 [facility] Patient Information 2012 [place], [place].Urinary Tract Infection Infections of the urinary tract can start in several places. A bladder infection (cystitis), a kidney infection (pyelonephritis), and a prostate infection (prostatitis) are different types of urinary tract infections (UTIs). They usually get better if treated with medicines (antibiotics) that kill germs. Take  all the medicine until it is gone. You or your child may feel better in a few days, but TAKE ALL MEDICINE or the infection may not respond and may become more difficult to treat. HOME CARE INSTRUCTIONS   Drink enough water and fluids to keep the urine clear or pale yellow. Cranberry juice is especially recommended, in addition to large amounts of water.   Avoid caffeine, tea, and carbonated beverages. They tend to irritate the bladder.   Alcohol may irritate the prostate.   Only take over-the-counter or prescription medicines for pain, discomfort, or fever as directed by your caregiver.  To prevent further infections:  Empty the bladder often. Avoid holding urine for long periods of time.   After a bowel movement, women should cleanse from front to back. Use each tissue only once.   Empty the bladder before and after sexual intercourse.  FINDING OUT THE RESULTS OF YOUR TEST Not all test results are available during your visit. If your or your child's test results are not back during the visit, make an appointment with your caregiver to find out the results. Do not assume everything is normal if you have not heard from your caregiver or the medical facility. It is important for you to follow up on all test results. SEEK MEDICAL CARE IF:   There is back pain.   Your baby is older than 3 months with a rectal temperature of 100.5 F (38.1 C) or higher for more than 1 day.   Your or your child's problems (symptoms)  are no better in 3 days. Return sooner if you or your child is getting worse.  SEEK IMMEDIATE MEDICAL CARE IF:   There is severe back pain or lower abdominal pain.   You or your child develops chills.   You have a fever.   Your baby is older than 3 months with a rectal temperature of 102 F (38.9 C) or higher.   Your baby is 93 months old or younger with a rectal temperature of 100.4 F (38 C) or higher.   There is nausea or vomiting.   There is continued burning or  discomfort with urination.  MAKE SURE YOU:   Understand these instructions.   Will watch your condition.   Will get help right away if you are not doing well or get worse.  Document Released: 03/11/2005 Document Revised: 05/21/2011 Document Reviewed: 10/14/2006 [facility] Patient Information 2012 [place], [place].

## 2011-08-29 NOTE — ED Notes (Signed)
Reports having fever, pain and ringing in both ears. Having blood when urinating, reports large clots and severe pain, unsure if blood is from bladder or vagina, reports no appetite today but denies n/vd.  No acute distress noted at triage.

## 2011-08-29 NOTE — ED Notes (Signed)
Pt complains of pain in head and ears, noted blood in urine and passing clots. ringignoted in ears no appetite but no n/v/d

## 2011-11-27 ENCOUNTER — Other Ambulatory Visit (HOSPITAL_COMMUNITY): Payer: Self-pay | Admitting: Internal Medicine

## 2011-11-27 DIAGNOSIS — Z1231 Encounter for screening mammogram for malignant neoplasm of breast: Secondary | ICD-10-CM

## 2011-12-22 ENCOUNTER — Ambulatory Visit (HOSPITAL_COMMUNITY): Payer: Medicare Other | Attending: Internal Medicine

## 2013-10-17 ENCOUNTER — Other Ambulatory Visit (HOSPITAL_COMMUNITY): Payer: Self-pay | Admitting: Nurse Practitioner

## 2013-10-17 DIAGNOSIS — Z1231 Encounter for screening mammogram for malignant neoplasm of breast: Secondary | ICD-10-CM

## 2013-10-20 ENCOUNTER — Ambulatory Visit (HOSPITAL_COMMUNITY): Admission: RE | Admit: 2013-10-20 | Payer: Medicare Other | Source: Ambulatory Visit

## 2013-11-08 ENCOUNTER — Encounter (HOSPITAL_COMMUNITY): Payer: Self-pay | Admitting: Emergency Medicine

## 2013-11-08 ENCOUNTER — Emergency Department (HOSPITAL_COMMUNITY)
Admission: EM | Admit: 2013-11-08 | Discharge: 2013-11-08 | Disposition: A | Discharge status: Home / Self Care | Payer: Medicare Other | Attending: Emergency Medicine | Admitting: Emergency Medicine

## 2013-11-08 DIAGNOSIS — H6690 Otitis media, unspecified, unspecified ear: Secondary | ICD-10-CM

## 2013-11-08 DIAGNOSIS — Z79899 Other long term (current) drug therapy: Secondary | ICD-10-CM | POA: Insufficient documentation

## 2013-11-08 DIAGNOSIS — F17200 Nicotine dependence, unspecified, uncomplicated: Secondary | ICD-10-CM | POA: Insufficient documentation

## 2013-11-08 HISTORY — DX: Unspecified ectopic pregnancy without intrauterine pregnancy: O00.90

## 2013-11-08 MED ORDER — AMOXICILLIN 500 MG PO CAPS: 500.0000 mg | ORAL_CAPSULE | Freq: Three times a day (TID) | ORAL | Status: DC

## 2013-11-08 NOTE — ED Notes (Signed)
Pt c/o bilateral ear pain X 2 days, reports hx of ear infections. sts it started as itchiness and then started to hurt. Pt sts yesterday she woke up in a sweat. Denies checking temperature at home. Nad, skin warm and dry, resp e/u.

## 2013-11-08 NOTE — Discharge Instructions (Signed)
Take Amoxicillin as directed until gone. Refer to attached documents for more information.

## 2013-11-08 NOTE — ED Provider Notes (Signed)
Medical screening examination/treatment/procedure(s) were performed by non-physician practitioner and as supervising physician I was immediately available for consultation/collaboration.   EKG Interpretation None        Dagmar Hait, MD 11/08/13 [phone]

## 2013-11-08 NOTE — ED Provider Notes (Signed)
CSN: [id number]     Arrival date & time 11/08/13  1145 History  This chart was scribed for non-physician practitioner Emilia Beck, PA-C working with Dagmar Hait, MD by Dorothey Baseman, ED Scribe. This patient was seen in room TR10C/TR10C and the patient's care was started at 12:19 PM.    Chief Complaint  Patient presents with  . Otalgia   Patient is a 58 y.o. female presenting with ear pain. The history is provided by the patient. No language interpreter was used.  Otalgia Location:  Bilateral Severity:  Moderate Onset quality:  Gradual Timing:  Constant Progression:  Worsening Chronicity:  Recurrent Relieved by:  Nothing Ineffective treatments:  OTC medications (aspirin) Associated symptoms: fever (subjective, resolved)   Fever:    Timing:  Intermittent   Temp source:  Subjective   Progression:  Resolved  HPI Comments: Terry Potter is a 58 y.o. Female with a history of ear infections who presents to the Emergency Department complaining of a constant pain to the bilateral ears onset 2 days ago. She states that her ears started out being itchy, but progressively worsened to become painful. Patient reports an associated, subjective fever (patient is afebrile at 98.2 in the ED). She reports taking aspirin at home without significant relief. She states that her current symptoms feel similar to her prior ear infections. Patient has no other pertinent medical history.   Past Medical History  Diagnosis Date  . Ectopic pregnancy    Past Surgical History  Procedure Laterality Date  . Abdominal surgery     No family history on file. History  Substance Use Topics  . Smoking status: Current Every Day Smoker    Types: Cigarettes, Cigars  . Smokeless tobacco: Not on file  . Alcohol Use: Yes     Comment: occ   OB History   Grav Para Term Preterm Abortions TAB SAB Ect Mult Living                 Review of Systems  Constitutional: Positive for fever (subjective,  resolved).  HENT: Positive for ear pain.   All other systems reviewed and are negative.     Allergies  Tetracyclines & related  Home Medications   Prior to Admission medications   Medication Sig Start Date End Date Taking? Authorizing Provider  clonazePAM (KLONOPIN) 0.5 MG tablet Take 0.5 mg by mouth 2 (two) times daily.    Historical Provider, MD  gabapentin (NEURONTIN) 300 MG capsule Take 300 mg by mouth 3 (three) times daily.    Historical Provider, MD  naproxen (NAPROSYN) 250 MG tablet Take 500 mg by mouth daily as needed. For body aches    Historical Provider, MD  Oxcarbazepine (TRILEPTAL) 300 MG tablet Take 300 mg by mouth 3 (three) times daily.    Historical Provider, MD  risperiDONE (RISPERDAL) 1 MG tablet Take 1 mg by mouth 2 (two) times daily.    Historical Provider, MD   Triage Vitals: BP 97/54  Pulse 75  Temp(Src) 98.2 F (36.8 C) (Oral)  Resp 18  Ht 5\' 4"  (1.626 m)  Wt 135 lb 12.9 oz (61.6 kg)  BMI 23.30 kg/m2  SpO2 96%  Physical Exam  Nursing note and vitals reviewed. Constitutional: She is oriented to person, place, and time. She appears well-developed and well-nourished. No distress.  HENT:  Head: Normocephalic and atraumatic.  Right Ear: Tympanic membrane is erythematous and bulging.  Left Ear: Tympanic membrane is erythematous and bulging.  Eyes: Conjunctivae are normal.  Neck: Normal range of motion. Neck supple.  Pulmonary/Chest: Effort normal. No respiratory distress.  Abdominal: She exhibits no distension.  Musculoskeletal: Normal range of motion.  Neurological: She is alert and oriented to person, place, and time.  Skin: Skin is warm and dry.  Psychiatric: She has a normal mood and affect. Her behavior is normal.    ED Course  Procedures (including critical care time)  DIAGNOSTIC STUDIES: Oxygen Saturation is 96% on room air, normal by my interpretation.    COORDINATION OF CARE: 12:22 PM- Will discharge patient with amoxicillin to treat an  ear infection. Discussed treatment plan with patient at bedside and patient verbalized agreement.     Labs Review Labs Reviewed - No data to display  Imaging Review No results found.   EKG Interpretation None      MDM   Final diagnoses:  Otitis media    12:25 PM Patient will have amoxicillin for otitis media. Vitals stable and patient afebrile.   I personally performed the services described in this documentation, which was scribed in my presence. The recorded information has been reviewed and is accurate.     Emilia Beck, PA-C 11/08/13 1226

## 2014-05-24 ENCOUNTER — Other Ambulatory Visit: Payer: Self-pay | Admitting: Nurse Practitioner

## 2014-05-24 DIAGNOSIS — Z1231 Encounter for screening mammogram for malignant neoplasm of breast: Secondary | ICD-10-CM

## 2014-06-06 ENCOUNTER — Ambulatory Visit: Payer: Medicare Other

## 2014-06-16 ENCOUNTER — Encounter (HOSPITAL_COMMUNITY): Payer: Self-pay | Admitting: *Deleted

## 2014-06-16 ENCOUNTER — Emergency Department (HOSPITAL_COMMUNITY)
Admission: EM | Admit: 2014-06-16 | Discharge: 2014-06-16 | Disposition: A | Discharge status: Home / Self Care | Payer: Medicare Other | Attending: Emergency Medicine | Admitting: Emergency Medicine

## 2014-06-16 DIAGNOSIS — Z792 Long term (current) use of antibiotics: Secondary | ICD-10-CM | POA: Diagnosis not present

## 2014-06-16 DIAGNOSIS — Z720 Tobacco use: Secondary | ICD-10-CM | POA: Diagnosis not present

## 2014-06-16 DIAGNOSIS — J029 Acute pharyngitis, unspecified: Secondary | ICD-10-CM | POA: Diagnosis present

## 2014-06-16 DIAGNOSIS — J020 Streptococcal pharyngitis: Secondary | ICD-10-CM | POA: Diagnosis not present

## 2014-06-16 DIAGNOSIS — Z79899 Other long term (current) drug therapy: Secondary | ICD-10-CM | POA: Insufficient documentation

## 2014-06-16 LAB — RAPID STREP SCREEN (MED CTR MEBANE ONLY): Streptococcus, Group A Screen (Direct): POSITIVE — AB

## 2014-06-16 MED ORDER — PENICILLIN G BENZATHINE 1200000 UNIT/2ML IM SUSP
1.2000 10*6.[IU] | Freq: Once | INTRAMUSCULAR | Status: AC
  Administered 2014-06-16: 1.2 10*6.[IU] via INTRAMUSCULAR
  Filled 2014-06-16 (×2): qty 2

## 2014-06-16 MED ORDER — IBUPROFEN 400 MG PO TABS
600.0000 mg | ORAL_TABLET | Freq: Once | ORAL | Status: AC
  Administered 2014-06-16: 600 mg via ORAL
  Filled 2014-06-16 (×2): qty 1

## 2014-06-16 NOTE — ED Notes (Signed)
The pt was given advil waiting for the bicillin shot to come up from the pharmacy

## 2014-06-16 NOTE — ED Notes (Signed)
edp saw before myself

## 2014-06-16 NOTE — Discharge Instructions (Signed)
If you were given medicines take as directed.  If you are on coumadin or contraceptives realize their levels and effectiveness is altered by many different medicines.  If you have any reaction (rash, tongues swelling, other) to the medicines stop taking and see a physician.   Please follow up as directed and return to the ER or see a physician for new or worsening symptoms.  Thank you. Filed Vitals:   06/16/14 1238  BP: 101/59  Pulse: 76  Temp: 98.4 F (36.9 C)  TempSrc: Oral  Resp: 18  SpO2: 96%

## 2014-06-16 NOTE — ED Provider Notes (Signed)
CSN: [id number]     Arrival date & time 06/16/14  1233 History   First MD Initiated Contact with Patient 06/16/14 1514     Chief Complaint  Patient presents with  . Sore Throat     (Consider location/radiation/quality/duration/timing/severity/associated sxs/prior Treatment) HPI Comments: 59 yo female with smoking hx presents with sore throat since New years, no sick contacts, tolerating po, pain with swallowing.  Patient is a 59 y.o. female presenting with pharyngitis. The history is provided by the patient.  Sore Throat Pertinent negatives include no chest pain, no abdominal pain, no headaches and no shortness of breath.    Past Medical History  Diagnosis Date  . Ectopic pregnancy    Past Surgical History  Procedure Laterality Date  . Abdominal surgery     History reviewed. No pertinent family history. History  Substance Use Topics  . Smoking status: Current Every Day Smoker    Types: Cigarettes, Cigars  . Smokeless tobacco: Not on file  . Alcohol Use: Yes     Comment: occ   OB History    No data available     Review of Systems  Constitutional: Negative for fever and chills.  HENT: Positive for sore throat. Negative for congestion and drooling.   Eyes: Negative for visual disturbance.  Respiratory: Negative for shortness of breath.   Cardiovascular: Negative for chest pain.  Gastrointestinal: Negative for vomiting and abdominal pain.  Genitourinary: Negative for dysuria and flank pain.  Musculoskeletal: Negative for back pain, neck pain and neck stiffness.  Skin: Negative for rash.  Neurological: Negative for light-headedness and headaches.      Allergies  Tetracyclines & related  Home Medications   Prior to Admission medications   Medication Sig Start Date End Date Taking? Authorizing Provider  amoxicillin (AMOXIL) 500 MG capsule Take 1 capsule (500 mg total) by mouth 3 (three) times daily. 11/08/13   Kaitlyn Szekalski, PA-C  clonazePAM (KLONOPIN) 0.5 MG  tablet Take 0.5 mg by mouth 2 (two) times daily.    Historical Provider, MD  gabapentin (NEURONTIN) 300 MG capsule Take 300 mg by mouth 3 (three) times daily.    Historical Provider, MD  naproxen (NAPROSYN) 250 MG tablet Take 500 mg by mouth daily as needed. For body aches    Historical Provider, MD  Oxcarbazepine (TRILEPTAL) 300 MG tablet Take 300 mg by mouth 3 (three) times daily.    Historical Provider, MD  risperiDONE (RISPERDAL) 1 MG tablet Take 1 mg by mouth 2 (two) times daily.    Historical Provider, MD   BP 101/59 mmHg  Pulse 76  Temp(Src) 98.4 F (36.9 C) (Oral)  Resp 18  SpO2 96% Physical Exam  Constitutional: She is oriented to person, place, and time. She appears well-developed and well-nourished.  HENT:  Head: Normocephalic and atraumatic.  Mild posterior erythema/ exudate, No trismus, uvular deviation, unilateral posterior pharyngeal edema or submandibular swelling.   Eyes: Conjunctivae are normal. Right eye exhibits no discharge. Left eye exhibits no discharge.  Neck: Normal range of motion. Neck supple. No tracheal deviation present.  Cardiovascular: Normal rate.   Pulmonary/Chest: Effort normal and breath sounds normal.  Neurological: She is alert and oriented to person, place, and time.  Skin: Skin is warm. No rash noted.  Psychiatric: She has a normal mood and affect.  Nursing note and vitals reviewed.   ED Course  Procedures (including critical care time) Labs Review Labs Reviewed  RAPID STREP SCREEN - Abnormal; Notable for the following:  Streptococcus, Group A Screen (Direct) POSITIVE (*)    All other components within normal limits    Imaging Review No results found.   EKG Interpretation None      MDM   Final diagnoses:  Acute streptococcal pharyngitis    Strep throat. Ibuprofen and IM PNC No signs of emergent infection Results and differential diagnosis were discussed with the patient/parent/guardian. Close follow up outpatient was  discussed, comfortable with the plan.   Medications  penicillin g benzathine (BICILLIN LA) 1200000 UNIT/2ML injection 1.2 Million Units (not administered)  ibuprofen (ADVIL,MOTRIN) tablet 600 mg (not administered)    Filed Vitals:   06/16/14 1238  BP: 101/59  Pulse: 76  Temp: 98.4 F (36.9 C)  TempSrc: Oral  Resp: 18  SpO2: 96%    Final diagnoses:  Acute streptococcal pharyngitis        Enid Skeens, MD 06/16/14 1538

## 2014-06-16 NOTE — ED Notes (Signed)
Pt reports having sore throat and headache. Reports pain and difficulty swallowing and fever. Airway is intact at triage.

## 2014-11-29 ENCOUNTER — Encounter (HOSPITAL_COMMUNITY): Payer: Self-pay | Admitting: Emergency Medicine

## 2014-11-29 ENCOUNTER — Emergency Department (INDEPENDENT_AMBULATORY_CARE_PROVIDER_SITE_OTHER)
Admission: EM | Admit: 2014-11-29 | Discharge: 2014-11-29 | Disposition: A | Discharge status: Home / Self Care | Payer: Medicare Other | Source: Home / Self Care

## 2014-11-29 DIAGNOSIS — M791 Myalgia, unspecified site: Secondary | ICD-10-CM

## 2014-11-29 DIAGNOSIS — G8929 Other chronic pain: Secondary | ICD-10-CM

## 2014-11-29 MED ORDER — NAPROXEN 250 MG PO TABS: 250.0000 mg | ORAL_TABLET | Freq: Two times a day (BID) | ORAL | Status: DC

## 2014-11-29 NOTE — ED Notes (Signed)
Pt states that she is hurting all over she is out of her medication since her doctor is [place] the [place].

## 2014-11-29 NOTE — Discharge Instructions (Signed)
Thank you for coming in today. °Come back or go to the emergency room if you notice new weakness new numbness problems walking or bowel or bladder problems. ° °Chronic Pain °Chronic pain can be defined as pain that is off and on and lasts for 3-6 months or longer. Many things cause chronic pain, which can make it difficult to make a diagnosis. There are many treatment options available for chronic pain. However, finding a treatment that works well for you may require trying various approaches until the right one is found. Many people benefit from a combination of two or more types of treatment to control their pain. °SYMPTOMS  °Chronic pain can occur anywhere in the body and can range from mild to very severe. Some types of chronic pain include: °· Headache. °· Low back pain. °· Cancer pain. °· Arthritis pain. °· Neurogenic pain. This is pain resulting from damage to nerves. ° People with chronic pain may also have other symptoms such as: °· Depression. °· Anger. °· Insomnia. °· Anxiety. °DIAGNOSIS  °Your health care provider will help diagnose your condition over time. In many cases, the initial focus will be on excluding possible conditions that could be causing the pain. Depending on your symptoms, your health care provider may order tests to diagnose your condition. Some of these tests may include:  °· Blood tests.   °· CT scan.   °· MRI.   °· X-rays.   °· Ultrasounds.   °· Nerve conduction studies.   °You may need to see a specialist.  °TREATMENT  °Finding treatment that works well may take time. You may be referred to a pain specialist. He or she may prescribe medicine or therapies, such as:  °· Mindful meditation or yoga. °· Shots (injections) of numbing or pain-relieving medicines into the spine or area of pain. °· Local electrical stimulation. °· Acupuncture.   °· Massage therapy.   °· Aroma, color, light, or sound therapy.   °· Biofeedback.   °· Working with a physical therapist to keep from getting stiff.    °· Regular, gentle exercise.   °· Cognitive or behavioral therapy.   °· Group support.   °Sometimes, surgery may be recommended.  °HOME CARE INSTRUCTIONS  °· Take all medicines as directed by your health care provider.   °· Lessen stress in your life by relaxing and doing things such as listening to calming music.   °· Exercise or be active as directed by your health care provider.   °· Eat a healthy diet and include things such as vegetables, fruits, fish, and lean meats in your diet.   °· Keep all follow-up appointments with your health care provider.   °· Attend a support group with others suffering from chronic pain. °SEEK MEDICAL CARE IF:  °· Your pain gets worse.   °· You develop a new pain that was not there before.   °· You cannot tolerate medicines given to you by your health care provider.   °· You have new symptoms since your last visit with your health care provider.   °SEEK IMMEDIATE MEDICAL CARE IF:  °· You feel weak.   °· You have decreased sensation or numbness.   °· You lose control of bowel or bladder function.   °· Your pain suddenly gets much worse.   °· You develop shaking. °· You develop chills. °· You develop confusion. °· You develop chest pain. °· You develop shortness of breath.   °MAKE SURE YOU: °· Understand these instructions. °· Will watch your condition. °· Will get help right away if you are not doing well or get worse. °Document Released: 02/21/2002 Document Revised: 02/01/2013 Document   Reviewed: 11/25/2012 Exit[employer] Patient Information 2015 [place], [place]. This information is not intended to replace advice given to you by your health care provider. Make sure you discuss any questions you have with your health care provider.   PRIMARY CARE Merchant navy officer at [employer] [address]  [place], [place] [phone]  Fax [phone]  Nature conservation officer at [facility] [address]  [place], [place]  [phone]  Fax [phone]  Nature conservation officer at [place] [facility] [place] [phone] [place], [place] [phone]  Fax [phone]  [facility] at [facility] [address]  [place], [place] [phone]  Fax [phone]  [facility] [address] [place] [address] [place], [place] [phone]  Fax [phone]  [facility] at [facility] [address] [place], [place] [phone]  Fax [phone]   [facility] @ [place] [address] [place] [address] Phone: [phone]   [facility] @ [facility] [address] [place] [address] Phone: [phone]   [facility] @ [place] [address] [place] [address] Phone: [phone]   [facility] @ [place] [address] [place] [address] Phone: [phone]   [facility] @ [place] [address] [place] [address] Phone: [phone] Fax: [phone]   [facility] @ [place] [address] [place] [address] Phone: [phone]   Dr. Maryelizabeth Rowan [address] [place] [address] [phone]

## 2014-11-29 NOTE — ED Provider Notes (Signed)
Terry Potter is a 59 y.o. female who presents to Urgent Care today for chronic pain. Patient has chronic diffuse pain. She typically receives 100 tablets of hydrocodone 10 mg/325 mg monthly. She ran out on June 2. She notes that her primary care provider who has been prescribing this medication is out of the country and she has an appointment next on July 2. She has not tried any other medications. She notes her pain is severe and diffuse. Her pain has not changed. No weakness or numbness or new injury. She requests refill of her chronic pain medications.   Past Medical History  Diagnosis Date  . Ectopic pregnancy    Past Surgical History  Procedure Laterality Date  . Abdominal surgery     History  Substance Use Topics  . Smoking status: Current Every Day Smoker    Types: Cigarettes, Cigars  . Smokeless tobacco: Not on file  . Alcohol Use: Yes     Comment: occ   ROS as above Medications: No current facility-administered medications for this encounter.   Current Outpatient Prescriptions  Medication Sig Dispense Refill  . ALPRAZolam (XANAX) 0.5 MG tablet     . ALPRAZolam (XANAX) 1 MG tablet     . amoxicillin (AMOXIL) 500 MG capsule Take 1 capsule (500 mg total) by mouth 3 (three) times daily. 21 capsule 0  . busPIRone (BUSPAR) 10 MG tablet     . ciprofloxacin (CIPRO) 500 MG tablet Take 500 mg by mouth 2 (two) times daily.  0  . clonazePAM (KLONOPIN) 0.5 MG tablet Take 0.5 mg by mouth 2 (two) times daily.    Marland Kitchen gabapentin (NEURONTIN) 300 MG capsule Take 300 mg by mouth 3 (three) times daily.    Marland Kitchen HYDROcodone-acetaminophen (NORCO) 10-325 MG per tablet   0  . HYDROcodone-acetaminophen (NORCO) 7.5-325 MG per tablet Take 1 tablet by mouth 4 (four) times daily.  0  . hydrOXYzine (ATARAX/VISTARIL) 25 MG tablet     . naproxen (NAPROSYN) 250 MG tablet Take 1 tablet (250 mg total) by mouth 2 (two) times daily with a meal. 30 tablet 0  . Olopatadine HCl 0.6 % SOLN     . Oxcarbazepine  (TRILEPTAL) 300 MG tablet Take 300 mg by mouth 3 (three) times daily.    Marland Kitchen PROAIR HFA 108 (90 BASE) MCG/ACT inhaler     . risperiDONE (RISPERDAL) 1 MG tablet Take 1 mg by mouth 2 (two) times daily.     Allergies  Allergen Reactions  . Tetracyclines & Related     hives     Exam:  BP 99/64 mmHg  Pulse 70  Temp(Src) 99.2 F (37.3 C) (Oral)  Resp 24  SpO2 100% Gen: Well NAD HEENT: EOMI,  MMM Lungs: Normal work of breathing. CTABL Heart: RRR no MRG Abd: NABS, Soft. Nondistended, Nontender Exts: Brisk capillary refill, warm and well perfused.  MSK: Diffusely tender throughout upper extremities back chest wall and lower extremities. No joint deformities  No results found for this or any previous visit (from the past 24 hour(s)). No results found.  Assessment and Plan: 59 y.o. female with chronic pain. Unfortunately I cannot refill her chronic pain medications. Discussed options. Patient requests naproxen. Will use 250 mg twice daily. In 2011 her creatinine was normal. I feel this is a safe dose. Follow-up with PCP ASAP.  Discussed warning signs or symptoms. Please see discharge instructions. Patient expresses understanding.     Rodolph Bong, MD 11/29/14 [phone]

## 2014-12-13 ENCOUNTER — Encounter (HOSPITAL_COMMUNITY): Payer: Self-pay

## 2014-12-13 ENCOUNTER — Emergency Department (HOSPITAL_COMMUNITY)
Admission: EM | Admit: 2014-12-13 | Discharge: 2014-12-13 | Disposition: A | Discharge status: Home / Self Care | Payer: Medicare Other | Attending: Emergency Medicine | Admitting: Emergency Medicine

## 2014-12-13 DIAGNOSIS — Z720 Tobacco use: Secondary | ICD-10-CM | POA: Diagnosis not present

## 2014-12-13 DIAGNOSIS — M069 Rheumatoid arthritis, unspecified: Secondary | ICD-10-CM | POA: Insufficient documentation

## 2014-12-13 DIAGNOSIS — Z7982 Long term (current) use of aspirin: Secondary | ICD-10-CM | POA: Insufficient documentation

## 2014-12-13 DIAGNOSIS — R42 Dizziness and giddiness: Secondary | ICD-10-CM | POA: Diagnosis not present

## 2014-12-13 DIAGNOSIS — M549 Dorsalgia, unspecified: Secondary | ICD-10-CM | POA: Insufficient documentation

## 2014-12-13 DIAGNOSIS — Z791 Long term (current) use of non-steroidal anti-inflammatories (NSAID): Secondary | ICD-10-CM | POA: Insufficient documentation

## 2014-12-13 DIAGNOSIS — K922 Gastrointestinal hemorrhage, unspecified: Secondary | ICD-10-CM | POA: Insufficient documentation

## 2014-12-13 DIAGNOSIS — N938 Other specified abnormal uterine and vaginal bleeding: Secondary | ICD-10-CM | POA: Insufficient documentation

## 2014-12-13 DIAGNOSIS — Z79899 Other long term (current) drug therapy: Secondary | ICD-10-CM | POA: Insufficient documentation

## 2014-12-13 DIAGNOSIS — R319 Hematuria, unspecified: Secondary | ICD-10-CM | POA: Diagnosis present

## 2014-12-13 HISTORY — DX: Unspecified osteoarthritis, unspecified site: M19.90

## 2014-12-13 LAB — PROTIME-INR
INR: 1.08 (ref 0.00–1.49)
Prothrombin Time: 14.2 seconds (ref 11.6–15.2)

## 2014-12-13 LAB — POC OCCULT BLOOD, ED: FECAL OCCULT BLD: NEGATIVE

## 2014-12-13 LAB — COMPREHENSIVE METABOLIC PANEL
ALT: 8 U/L — ABNORMAL LOW (ref 14–54)
AST: 23 U/L (ref 15–41)
Albumin: 3.3 g/dL — ABNORMAL LOW (ref 3.5–5.0)
Alkaline Phosphatase: 61 U/L (ref 38–126)
Anion gap: 8 (ref 5–15)
BILIRUBIN TOTAL: 1.1 mg/dL (ref 0.3–1.2)
BUN: 16 mg/dL (ref 6–20)
CALCIUM: 8.8 mg/dL — AB (ref 8.9–10.3)
CHLORIDE: 106 mmol/L (ref 101–111)
CO2: 23 mmol/L (ref 22–32)
CREATININE: 1.13 mg/dL — AB (ref 0.44–1.00)
GFR, EST NON AFRICAN AMERICAN: 52 mL/min — AB (ref >60)
Glucose, Bld: 85 mg/dL (ref 65–99)
Potassium: 4.9 mmol/L (ref 3.5–5.1)
Sodium: 137 mmol/L (ref 135–145)
Total Protein: 6.3 g/dL — ABNORMAL LOW (ref 6.5–8.1)

## 2014-12-13 LAB — CBC WITH DIFFERENTIAL/PLATELET
Basophils Absolute: 0 10*3/uL (ref 0.0–0.1)
Basophils Relative: 1 % (ref 0–1)
EOS ABS: 0.1 10*3/uL (ref 0.0–0.7)
Eosinophils Relative: 1 % (ref 0–5)
HEMATOCRIT: 39.4 % (ref 36.0–46.0)
HEMOGLOBIN: 13.5 g/dL (ref 12.0–15.0)
LYMPHS ABS: 1.9 10*3/uL (ref 0.7–4.0)
Lymphocytes Relative: 29 % (ref 12–46)
MCH: 31.8 pg (ref 26.0–34.0)
MCHC: 34.3 g/dL (ref 30.0–36.0)
MCV: 92.7 fL (ref 78.0–100.0)
MONO ABS: 0.5 10*3/uL (ref 0.1–1.0)
Monocytes Relative: 8 % (ref 3–12)
Neutro Abs: 4.1 10*3/uL (ref 1.7–7.7)
Neutrophils Relative %: 61 % (ref 43–77)
PLATELETS: 216 10*3/uL (ref 150–400)
RBC: 4.25 MIL/uL (ref 3.87–5.11)
RDW: 15.2 % (ref 11.5–15.5)
WBC: 6.7 10*3/uL (ref 4.0–10.5)

## 2014-12-13 LAB — URINALYSIS, ROUTINE W REFLEX MICROSCOPIC
BILIRUBIN URINE: NEGATIVE
GLUCOSE, UA: NEGATIVE mg/dL
KETONES UR: 15 mg/dL — AB
NITRITE: NEGATIVE
PH: 5.5 (ref 5.0–8.0)
PROTEIN: 100 mg/dL — AB
SPECIFIC GRAVITY, URINE: 1.026 (ref 1.005–1.030)
UROBILINOGEN UA: 0.2 mg/dL (ref 0.0–1.0)

## 2014-12-13 LAB — URINE MICROSCOPIC-ADD ON

## 2014-12-13 NOTE — ED Provider Notes (Signed)
CSN: [id number]     Arrival date & time 12/13/14  1228 History   First MD Initiated Contact with Patient 12/13/14 1250     Chief Complaint  Patient presents with  . Blood In Stools  . Hematuria     (Consider location/radiation/quality/duration/timing/severity/associated sxs/prior Treatment) Patient is a 59 y.o. female presenting with hematuria. The history is provided by the patient.  Hematuria Pertinent negatives include no chest pain, no abdominal pain, no headaches and no shortness of breath.   patient presents with rectal bleeding and possibly hematuria and vaginal bleeding. States she had some blood in the stool yesterday and states today she has had blood coming from everywhere down there. States she did not come in yesterday because she thought were to stop. States she does feel little lightheaded. States she's been out of her pain medicines for a while since her doctors from out-of-town. No other bleeding. She takes aspirin. States she did have some rectal bleeding when she was much younger. No fevers. No cough. States she has felt lightheaded. States she has rheumatoid arthritis. No real abdominal pain.  Past Medical History  Diagnosis Date  . Ectopic pregnancy   . Arthritis    Past Surgical History  Procedure Laterality Date  . Abdominal surgery    . Tumor removal      Uterine tumor removed   History reviewed. No pertinent family history. History  Substance Use Topics  . Smoking status: Current Every Day Smoker -- 0.25 packs/day    Types: Cigarettes, Cigars  . Smokeless tobacco: Not on file  . Alcohol Use: Yes     Comment: occ   OB History    No data available     Review of Systems  Constitutional: Negative for activity change and appetite change.  Eyes: Negative for pain.  Respiratory: Negative for chest tightness and shortness of breath.   Cardiovascular: Negative for chest pain and leg swelling.  Gastrointestinal: Positive for blood in stool. Negative for  nausea, vomiting, abdominal pain and diarrhea.  Genitourinary: Positive for hematuria. Negative for flank pain.  Musculoskeletal: Positive for back pain. Negative for neck stiffness.  Skin: Negative for rash.  Neurological: Positive for light-headedness. Negative for weakness, numbness and headaches.  Hematological: Bruises/bleeds easily.  Psychiatric/Behavioral: Negative for behavioral problems.      Allergies  Tetracyclines & related  Home Medications   Prior to Admission medications   Medication Sig Start Date End Date Taking? Authorizing Provider  aspirin 325 MG tablet Take 325 mg by mouth daily.   Yes Historical Provider, MD  gabapentin (NEURONTIN) 300 MG capsule Take 300 mg by mouth 3 (three) times daily.   Yes Historical Provider, MD  HYDROcodone-acetaminophen (NORCO) 10-325 MG per tablet Take 2 tablets by mouth every 6 (six) hours as needed.   Yes Historical Provider, MD  Multiple Vitamins-Minerals (MULTIVITAMIN & MINERAL PO) Take 1 tablet by mouth daily.   Yes Historical Provider, MD  naproxen (NAPROSYN) 500 MG tablet Take 500 mg by mouth 2 (two) times daily with a meal. 11/05/14  Yes Historical Provider, MD  Oxcarbazepine (TRILEPTAL) 300 MG tablet Take 300 mg by mouth 3 (three) times daily.   Yes Historical Provider, MD  PROAIR HFA 108 (90 BASE) MCG/ACT inhaler Inhale 1-2 puffs into the lungs every 4 (four) hours as needed for wheezing or shortness of breath.  04/03/14  Yes Historical Provider, MD  risperiDONE (RISPERDAL) 1 MG tablet Take 1 mg by mouth 2 (two) times daily.   Yes Historical  Provider, MD   BP 118/65 mmHg  Pulse 63  Temp(Src) 98.3 F (36.8 C) (Oral)  Resp 16  Ht 5\' 4"  (1.626 m)  Wt 156 lb (70.761 kg)  BMI 26.76 kg/m2  SpO2 100% Physical Exam  Constitutional: She is oriented to person, place, and time. She appears well-developed and well-nourished.  HENT:  Head: Normocephalic and atraumatic.  Eyes: Pupils are equal, round, and reactive to light.  Neck:  Normal range of motion. Neck supple.  Cardiovascular: Normal rate, regular rhythm and normal heart sounds.   Pulmonary/Chest: Effort normal and breath sounds normal. No respiratory distress.  Abdominal: Soft. She exhibits no distension.  Musculoskeletal: Normal range of motion.  Neurological: She is alert and oriented to person, place, and time.  Skin: Skin is warm and dry.  Psychiatric: She has a normal mood and affect. Her speech is normal.  Nursing note and vitals reviewed.   ED Course  Procedures (including critical care time) Labs Review Labs Reviewed  COMPREHENSIVE METABOLIC PANEL - Abnormal; Notable for the following:    Creatinine, Ser 1.13 (*)    Calcium 8.8 (*)    Total Protein 6.3 (*)    Albumin 3.3 (*)    ALT 8 (*)    GFR calc non Af Amer 52 (*)    All other components within normal limits  URINALYSIS, ROUTINE W REFLEX MICROSCOPIC (NOT AT [facility]) - Abnormal; Notable for the following:    Color, Urine AMBER (*)    APPearance TURBID (*)    Hgb urine dipstick LARGE (*)    Ketones, ur 15 (*)    Protein, ur 100 (*)    Leukocytes, UA LARGE (*)    All other components within normal limits  URINE MICROSCOPIC-ADD ON - Abnormal; Notable for the following:    Squamous Epithelial / LPF FEW (*)    All other components within normal limits  CBC WITH DIFFERENTIAL/PLATELET  PROTIME-INR  POC OCCULT BLOOD, ED    Imaging Review No results found.   EKG Interpretation None      MDM   Final diagnoses:  Gastrointestinal hemorrhage, unspecified gastritis, unspecified gastrointestinal hemorrhage type    Patient with some GI bleeding. May have had blood in the urine and blood in her stool. Negative Hemoccults patient not willing to wait for further evaluation and left.    [facility], MD 12/15/14 [phone]

## 2014-12-13 NOTE — ED Notes (Signed)
Pt reports she noticed blood in her stool yesterday. Waited for it to pass and had a similar occurrence today. Pt reports pain with urination and generalized weakness. No distress noted.

## 2014-12-13 NOTE — Discharge Instructions (Signed)

## 2014-12-16 ENCOUNTER — Emergency Department (HOSPITAL_COMMUNITY)
Admission: EM | Admit: 2014-12-16 | Discharge: 2014-12-16 | Disposition: A | Discharge status: Home / Self Care | Payer: Medicare Other | Attending: Emergency Medicine | Admitting: Emergency Medicine

## 2014-12-16 ENCOUNTER — Encounter (HOSPITAL_COMMUNITY): Payer: Self-pay | Admitting: Emergency Medicine

## 2014-12-16 ENCOUNTER — Emergency Department (HOSPITAL_COMMUNITY): Payer: Medicare Other

## 2014-12-16 DIAGNOSIS — Z720 Tobacco use: Secondary | ICD-10-CM | POA: Insufficient documentation

## 2014-12-16 DIAGNOSIS — R0602 Shortness of breath: Secondary | ICD-10-CM

## 2014-12-16 DIAGNOSIS — Z79899 Other long term (current) drug therapy: Secondary | ICD-10-CM | POA: Insufficient documentation

## 2014-12-16 DIAGNOSIS — N390 Urinary tract infection, site not specified: Secondary | ICD-10-CM | POA: Diagnosis not present

## 2014-12-16 DIAGNOSIS — Z7982 Long term (current) use of aspirin: Secondary | ICD-10-CM | POA: Insufficient documentation

## 2014-12-16 DIAGNOSIS — R0789 Other chest pain: Secondary | ICD-10-CM | POA: Diagnosis not present

## 2014-12-16 DIAGNOSIS — Z791 Long term (current) use of non-steroidal anti-inflammatories (NSAID): Secondary | ICD-10-CM | POA: Insufficient documentation

## 2014-12-16 DIAGNOSIS — M1990 Unspecified osteoarthritis, unspecified site: Secondary | ICD-10-CM | POA: Insufficient documentation

## 2014-12-16 DIAGNOSIS — F1010 Alcohol abuse, uncomplicated: Secondary | ICD-10-CM | POA: Insufficient documentation

## 2014-12-16 DIAGNOSIS — R1084 Generalized abdominal pain: Secondary | ICD-10-CM

## 2014-12-16 DIAGNOSIS — R319 Hematuria, unspecified: Secondary | ICD-10-CM

## 2014-12-16 DIAGNOSIS — R55 Syncope and collapse: Secondary | ICD-10-CM | POA: Diagnosis not present

## 2014-12-16 DIAGNOSIS — R110 Nausea: Secondary | ICD-10-CM

## 2014-12-16 DIAGNOSIS — Z86018 Personal history of other benign neoplasm: Secondary | ICD-10-CM | POA: Insufficient documentation

## 2014-12-16 LAB — CBC WITH DIFFERENTIAL/PLATELET
BASOS ABS: 0 10*3/uL (ref 0.0–0.1)
BASOS PCT: 0 % (ref 0–1)
EOS ABS: 0.1 10*3/uL (ref 0.0–0.7)
Eosinophils Relative: 1 % (ref 0–5)
HCT: 40.3 % (ref 36.0–46.0)
HEMOGLOBIN: 14 g/dL (ref 12.0–15.0)
Lymphocytes Relative: 31 % (ref 12–46)
Lymphs Abs: 1.5 10*3/uL (ref 0.7–4.0)
MCH: 32 pg (ref 26.0–34.0)
MCHC: 34.7 g/dL (ref 30.0–36.0)
MCV: 92.2 fL (ref 78.0–100.0)
MONO ABS: 0.4 10*3/uL (ref 0.1–1.0)
MONOS PCT: 9 % (ref 3–12)
Neutro Abs: 2.9 10*3/uL (ref 1.7–7.7)
Neutrophils Relative %: 59 % (ref 43–77)
Platelets: 209 10*3/uL (ref 150–400)
RBC: 4.37 MIL/uL (ref 3.87–5.11)
RDW: 14.6 % (ref 11.5–15.5)
WBC: 4.9 10*3/uL (ref 4.0–10.5)

## 2014-12-16 LAB — URINALYSIS, ROUTINE W REFLEX MICROSCOPIC
Glucose, UA: NEGATIVE mg/dL
KETONES UR: 15 mg/dL — AB
Nitrite: NEGATIVE
Protein, ur: 100 mg/dL — AB
SPECIFIC GRAVITY, URINE: 1.02 (ref 1.005–1.030)
UROBILINOGEN UA: 1 mg/dL (ref 0.0–1.0)
pH: 5.5 (ref 5.0–8.0)

## 2014-12-16 LAB — COMPREHENSIVE METABOLIC PANEL
ALBUMIN: 3.4 g/dL — AB (ref 3.5–5.0)
ALK PHOS: 69 U/L (ref 38–126)
ALT: 12 U/L — AB (ref 14–54)
AST: 19 U/L (ref 15–41)
Anion gap: 11 (ref 5–15)
BUN: 10 mg/dL (ref 6–20)
CHLORIDE: 103 mmol/L (ref 101–111)
CO2: 20 mmol/L — AB (ref 22–32)
Calcium: 8.7 mg/dL — ABNORMAL LOW (ref 8.9–10.3)
Creatinine, Ser: 1.04 mg/dL — ABNORMAL HIGH (ref 0.44–1.00)
GFR, EST NON AFRICAN AMERICAN: 58 mL/min — AB (ref >60)
Glucose, Bld: 88 mg/dL (ref 65–99)
POTASSIUM: 3.6 mmol/L (ref 3.5–5.1)
SODIUM: 134 mmol/L — AB (ref 135–145)
TOTAL PROTEIN: 6.5 g/dL (ref 6.5–8.1)

## 2014-12-16 LAB — I-STAT TROPONIN, ED: Troponin i, poc: 0 ng/mL (ref 0.00–0.08)

## 2014-12-16 LAB — ETHANOL: Alcohol, Ethyl (B): 52 mg/dL — ABNORMAL HIGH (ref <5)

## 2014-12-16 LAB — URINE MICROSCOPIC-ADD ON

## 2014-12-16 LAB — LIPASE, BLOOD: LIPASE: 17 U/L — AB (ref 22–51)

## 2014-12-16 MED ORDER — SULFAMETHOXAZOLE-TRIMETHOPRIM 800-160 MG PO TABS: 1.0000 | ORAL_TABLET | Freq: Two times a day (BID) | ORAL | Status: DC

## 2014-12-16 MED ORDER — LIDOCAINE VISCOUS 2 % MT SOLN
15.0000 mL | Freq: Once | OROMUCOSAL | Status: AC
  Administered 2014-12-16: 15 mL via OROMUCOSAL
  Filled 2014-12-16: qty 15

## 2014-12-16 MED ORDER — PANTOPRAZOLE SODIUM 40 MG IV SOLR
40.0000 mg | Freq: Once | INTRAVENOUS | Status: AC
  Administered 2014-12-16: 40 mg via INTRAVENOUS
  Filled 2014-12-16: qty 40

## 2014-12-16 MED ORDER — ONDANSETRON HCL 4 MG/2ML IJ SOLN
4.0000 mg | Freq: Once | INTRAMUSCULAR | Status: AC
  Administered 2014-12-16: 4 mg via INTRAVENOUS
  Filled 2014-12-16: qty 2

## 2014-12-16 MED ORDER — ONDANSETRON HCL 8 MG PO TABS: 8.0000 mg | ORAL_TABLET | Freq: Three times a day (TID) | ORAL | Status: DC | PRN

## 2014-12-16 MED ORDER — ALUM & MAG HYDROXIDE-SIMETH 200-200-20 MG/5ML PO SUSP
15.0000 mL | Freq: Once | ORAL | Status: AC
  Administered 2014-12-16: 15 mL via ORAL
  Filled 2014-12-16: qty 30

## 2014-12-16 MED ORDER — FENTANYL CITRATE (PF) 100 MCG/2ML IJ SOLN
25.0000 ug | Freq: Once | INTRAMUSCULAR | Status: AC
  Administered 2014-12-16: 25 ug via INTRAVENOUS
  Filled 2014-12-16: qty 2

## 2014-12-16 MED ORDER — SODIUM CHLORIDE 0.9 % IV BOLUS (SEPSIS)
1000.0000 mL | Freq: Once | INTRAVENOUS | Status: AC
  Administered 2014-12-16: 1000 mL via INTRAVENOUS

## 2014-12-16 NOTE — ED Notes (Signed)
Pt. Stated, I passed out after my date last night and then I passed out again I guess through the night.  I don't remember until this morning.

## 2014-12-16 NOTE — ED Notes (Signed)
Patient transported to radiology

## 2014-12-16 NOTE — ED Provider Notes (Signed)
CSN: [id number]     Arrival date & time 12/16/14  1201 History   First MD Initiated Contact with Patient 12/16/14 1225     Chief Complaint  Patient presents with  . Abdominal Pain  . Chest Pain  . Hematuria  . Near Syncope     (Consider location/radiation/quality/duration/timing/severity/associated sxs/prior Treatment) HPI Comments: Terry Potter is a 59 y.o. female with a PMHx of remote ectopic pregnancy and rheumatoid arthritis, with a PSHx of uterine tumor removal, who presents to the ED with multiple complaints. Her primary complaint is 4 days of gradual onset 10/10 generalized abdominal pain radiating to her chest, throbbing and constant, worse with talking and movement, and with no known alleviating factors given that she has not tried anything prior to arrival. She was evaluated 4 days ago for hematuria abdominal pain and hematochezia, had a negative workup aside from her UA showing hematuria, and left before her workup was complete. Today she stating that last night she had a syncopal episode which she describes as "falling asleep on a bed" yesterday at around 9:30 PM. She is unsure of how long she was out for. Additional complaints include hematuria, dysuria, urinary frequency, nausea, and shortness of breath. She is a difficult historian and intermittently stops answering questions or refuses to answer specific questions. She denies any fevers, chills, cough, wheezing, hemoptysis, leg swelling, vomiting, diarrhea, constipation, obstipation, melena, hematochezia, vaginal bleeding or discharge, numbness, tingling, focal weakness. She refuses to respond regarding any lightheadedness or diaphoresis. She denies any recent travel, surgeries, sick contacts, suspicious food intake, or antibiotic use. She admits to drinking alcohol, states that she drank 2 ounces of liquor this morning.  Patient is a 59 y.o. female presenting with abdominal pain, chest pain, hematuria, and near-syncope. The  history is provided by the patient.  Abdominal Pain Pain location:  Generalized Pain quality: throbbing   Pain radiates to:  Chest Pain severity:  Severe Onset quality:  Gradual Duration:  4 days Timing:  Constant Progression:  Unchanged Chronicity:  New Context: alcohol use   Context: not recent travel, not sick contacts and not suspicious food intake   Relieved by:  None tried Worsened by:  Movement (talking and movement) Ineffective treatments:  None tried Associated symptoms: chest pain (radiating from abdomen), dysuria, hematuria, nausea and shortness of breath   Associated symptoms: no chills, no constipation, no cough, no diarrhea, no fever, no flatus, no hematemesis, no hematochezia, no melena, no vaginal bleeding, no vaginal discharge and no vomiting   Risk factors: alcohol abuse   Risk factors: has not had multiple surgeries   Chest Pain Associated symptoms: abdominal pain, nausea, near-syncope and shortness of breath   Associated symptoms: no cough, no fever, no numbness, not vomiting and no weakness   Hematuria Associated symptoms include abdominal pain, chest pain (radiating from abdomen) and nausea. Pertinent negatives include no arthralgias, chills, coughing, fever, myalgias, numbness, vomiting or weakness.  Near Syncope Associated symptoms include abdominal pain, chest pain (radiating from abdomen) and nausea. Pertinent negatives include no arthralgias, chills, coughing, fever, myalgias, numbness, vomiting or weakness.    Past Medical History  Diagnosis Date  . Ectopic pregnancy   . Arthritis    Past Surgical History  Procedure Laterality Date  . Abdominal surgery    . Tumor removal      Uterine tumor removed   No family history on file. History  Substance Use Topics  . Smoking status: Current Every Day Smoker -- 0.25 packs/day  Types: Cigarettes, Cigars  . Smokeless tobacco: Not on file  . Alcohol Use: Yes     Comment: occ   OB History    No data  available     Review of Systems  Constitutional: Negative for fever and chills.  Eyes: Negative for visual disturbance.  Respiratory: Positive for shortness of breath. Negative for cough and wheezing.   Cardiovascular: Positive for chest pain (radiating from abdomen) and near-syncope.  Gastrointestinal: Positive for nausea and abdominal pain. Negative for vomiting, diarrhea, constipation, blood in stool, melena, hematochezia, flatus and hematemesis.  Genitourinary: Positive for dysuria, frequency and hematuria. Negative for vaginal bleeding and vaginal discharge.  Musculoskeletal: Negative for myalgias and arthralgias.  Skin: Negative for color change.  Allergic/Immunologic: Negative for immunocompromised state.  Neurological: Positive for syncope ("fell asleep"). Negative for weakness and numbness.  Psychiatric/Behavioral: Negative for confusion.   10 Systems reviewed and are negative for acute change except as noted in the HPI.    Allergies  Tetracyclines & related  Home Medications   Prior to Admission medications   Medication Sig Start Date End Date Taking? Authorizing Provider  aspirin 325 MG tablet Take 325 mg by mouth daily.    Historical Provider, MD  gabapentin (NEURONTIN) 300 MG capsule Take 300 mg by mouth 3 (three) times daily.    Historical Provider, MD  HYDROcodone-acetaminophen (NORCO) 10-325 MG per tablet Take 2 tablets by mouth every 6 (six) hours as needed.    Historical Provider, MD  Multiple Vitamins-Minerals (MULTIVITAMIN & MINERAL PO) Take 1 tablet by mouth daily.    Historical Provider, MD  naproxen (NAPROSYN) 500 MG tablet Take 500 mg by mouth 2 (two) times daily with a meal. 11/05/14   Historical Provider, MD  Oxcarbazepine (TRILEPTAL) 300 MG tablet Take 300 mg by mouth 3 (three) times daily.    Historical Provider, MD  PROAIR HFA 108 (90 BASE) MCG/ACT inhaler Inhale 1-2 puffs into the lungs every 4 (four) hours as needed for wheezing or shortness of breath.   04/03/14   Historical Provider, MD  risperiDONE (RISPERDAL) 1 MG tablet Take 1 mg by mouth 2 (two) times daily.    Historical Provider, MD   BP 100/65 mmHg  Pulse 68  Temp(Src) 98 F (36.7 C) (Oral)  Resp 21  Ht 5\' 4"  (1.626 m)  Wt 156 lb (70.761 kg)  BMI 26.76 kg/m2  SpO2 96%   Physical Exam  Constitutional: She is oriented to person, place, and time. Vital signs are normal. She appears well-developed and well-nourished.  Non-toxic appearance. No distress.  Afebrile, nontoxic, NAD. Wearing mardi gras beads around her neck.   HENT:  Head: Normocephalic and atraumatic.  Mouth/Throat: Oropharynx is clear and moist and mucous membranes are normal.  Eyes: Conjunctivae and EOM are normal. Pupils are equal, round, and reactive to light. Right eye exhibits no discharge. Left eye exhibits no discharge.  PERRL, EOMI, no nystagmus, no visual field deficits   Neck: Normal range of motion. Neck supple.  Cardiovascular: Normal rate, regular rhythm, normal heart sounds and intact distal pulses.  Exam reveals no gallop and no friction rub.   No murmur heard. RRR, nl s1/s2, no m/r/g, distal pulses intact, no pedal edema   Pulmonary/Chest: Effort normal and breath sounds normal. No respiratory distress. She has no decreased breath sounds. She has no wheezes. She has no rhonchi. She has no rales. She exhibits tenderness. She exhibits no crepitus, no deformity and no retraction.  CTAB in all lung fields, no  w/r/r, no hypoxia or increased WOB, speaking in full sentences, SpO2 96% on RA  Chest wall diffusely tender, no crepitus or retractions, no deformities.  Abdominal: Soft. Normal appearance and bowel sounds are normal. She exhibits no distension. There is generalized tenderness. There is no rigidity, no rebound, no guarding, no CVA tenderness, no tenderness at McBurney's point and negative Murphy's sign.  Soft, nondistended, +BS throughout, with diffuse abdominal tenderness, no r/g/r, neg murphy's, neg  mcburney's, no CVA TTP   Musculoskeletal: Normal range of motion.  MAE x4 Uncooperative with exam, but witnessed pt grab handles of bed and pull herself up without assistance Sensation grossly intact Distal pulses intact No pedal edema, neg homan's bilaterally   Neurological: She is alert and oriented to person, place, and time. She has normal strength. No sensory deficit. GCS eye subscore is 4. GCS verbal subscore is 5. GCS motor subscore is 6.  Pt uncooperative with neurologic exam, but no gross focal neuro deficits A&O x4 GCS 15  Skin: Skin is warm, dry and intact. No rash noted.  Psychiatric: She has a normal mood and affect.  Nursing note and vitals reviewed.   ED Course  Procedures (including critical care time)  15:13 Orthostatic Vital Signs JL  Orthostatic Lying  - BP- Lying: 114/65 mmHg ; Pulse- Lying: 60  Orthostatic Sitting - BP- Sitting: 111/72 mmHg ; Pulse- Sitting: 61  Orthostatic Standing at 0 minutes - BP- Standing at 0 minutes: 114/72 mmHg ; Pulse- Standing at 0 minutes: 71       Labs Review Labs Reviewed  URINALYSIS, ROUTINE W REFLEX MICROSCOPIC (NOT AT [facility]) - Abnormal; Notable for the following:    Color, Urine RED (*)    APPearance TURBID (*)    Hgb urine dipstick LARGE (*)    Bilirubin Urine SMALL (*)    Ketones, ur 15 (*)    Protein, ur 100 (*)    Leukocytes, UA LARGE (*)    All other components within normal limits  COMPREHENSIVE METABOLIC PANEL - Abnormal; Notable for the following:    Sodium 134 (*)    CO2 20 (*)    Creatinine, Ser 1.04 (*)    Calcium 8.7 (*)    Albumin 3.4 (*)    ALT 12 (*)    Total Bilirubin <0.1 (*)    GFR calc non Af Amer 58 (*)    All other components within normal limits  LIPASE, BLOOD - Abnormal; Notable for the following:    Lipase 17 (*)    All other components within normal limits  ETHANOL - Abnormal; Notable for the following:    Alcohol, Ethyl (B) 52 (*)    All other components within normal limits  URINE  MICROSCOPIC-ADD ON - Abnormal; Notable for the following:    Squamous Epithelial / LPF FEW (*)    All other components within normal limits  URINE CULTURE  CBC WITH DIFFERENTIAL/PLATELET  I-STAT TROPOININ, ED     Results for orders placed or performed during the hospital encounter of 12/13/14  Comprehensive metabolic panel  Result Value Ref Range   Sodium 137 135 - 145 mmol/L   Potassium 4.9 3.5 - 5.1 mmol/L   Chloride 106 101 - 111 mmol/L   CO2 23 22 - 32 mmol/L   Glucose, Bld 85 65 - 99 mg/dL   BUN 16 6 - 20 mg/dL   Creatinine, Ser 7.78 (H) 0.44 - 1.00 mg/dL   Calcium 8.8 (L) 8.9 - 10.3 mg/dL   Total  Protein 6.3 (L) 6.5 - 8.1 g/dL   Albumin 3.3 (L) 3.5 - 5.0 g/dL   AST 23 15 - 41 U/L   ALT 8 (L) 14 - 54 U/L   Alkaline Phosphatase 61 38 - 126 U/L   Total Bilirubin 1.1 0.3 - 1.2 mg/dL   GFR calc non Af Amer 52 (L) >60 mL/min   GFR calc Af Amer >60 >60 mL/min   Anion gap 8 5 - 15  CBC with Differential  Result Value Ref Range   WBC 6.7 4.0 - 10.5 K/uL   RBC 4.25 3.87 - 5.11 MIL/uL   Hemoglobin 13.5 12.0 - 15.0 g/dL   HCT 16.1 09.6 - 04.5 %   MCV 92.7 78.0 - 100.0 fL   MCH 31.8 26.0 - 34.0 pg   MCHC 34.3 30.0 - 36.0 g/dL   RDW 40.9 81.1 - 91.4 %   Platelets 216 150 - 400 K/uL   Neutrophils Relative % 61 43 - 77 %   Neutro Abs 4.1 1.7 - 7.7 K/uL   Lymphocytes Relative 29 12 - 46 %   Lymphs Abs 1.9 0.7 - 4.0 K/uL   Monocytes Relative 8 3 - 12 %   Monocytes Absolute 0.5 0.1 - 1.0 K/uL   Eosinophils Relative 1 0 - 5 %   Eosinophils Absolute 0.1 0.0 - 0.7 K/uL   Basophils Relative 1 0 - 1 %   Basophils Absolute 0.0 0.0 - 0.1 K/uL  Protime-INR  Result Value Ref Range   Prothrombin Time 14.2 11.6 - 15.2 seconds   INR 1.08 0.00 - 1.49  Urinalysis, Routine w reflex microscopic (not at [facility])  Result Value Ref Range   Color, Urine AMBER (A) YELLOW   APPearance TURBID (A) CLEAR   Specific Gravity, Urine 1.026 1.005 - 1.030   pH 5.5 5.0 - 8.0   Glucose, UA NEGATIVE  NEGATIVE mg/dL   Hgb urine dipstick LARGE (A) NEGATIVE   Bilirubin Urine NEGATIVE NEGATIVE   Ketones, ur 15 (A) NEGATIVE mg/dL   Protein, ur 782 (A) NEGATIVE mg/dL   Urobilinogen, UA 0.2 0.0 - 1.0 mg/dL   Nitrite NEGATIVE NEGATIVE   Leukocytes, UA LARGE (A) NEGATIVE  Urine microscopic-add on  Result Value Ref Range   Squamous Epithelial / LPF FEW (A) RARE   WBC, UA TOO NUMEROUS TO COUNT <3 WBC/hpf   RBC / HPF TOO NUMEROUS TO COUNT <3 RBC/hpf   Bacteria, UA RARE RARE  POC occult blood, ED Provider will collect  Result Value Ref Range   Fecal Occult Bld NEGATIVE NEGATIVE     Imaging Review Dg Abd Acute W/chest  12/16/2014   CLINICAL DATA:  Generalized chest pain.  Abdominal pain.  EXAM: DG ABDOMEN ACUTE W/ 1V CHEST  COMPARISON:  None.  FINDINGS: There is no evidence of dilated bowel loops or free intraperitoneal air. No radiopaque calculi or other significant radiographic abnormality is seen. Heart size and mediastinal contours are within normal limits. Both lungs are clear.  IMPRESSION: Negative abdominal radiographs.  No acute cardiopulmonary disease.   Electronically Signed   By: Andreas Newport M.D.   On: 12/16/2014 15:07     EKG Interpretation   Date/Time:  Sunday December 16 2014 12:16:28 EDT Ventricular Rate:  80 PR Interval:  146 QRS Duration: 70 QT Interval:  352 QTC Calculation: 405 R Axis:   57 Text Interpretation:  Normal sinus rhythm Anterior infarct , age  undetermined Abnormal ECG Confirmed by ZACKOWSKI  MD, SCOTT [phone]) on  12/16/2014 12:39:52 PM      MDM   Final diagnoses:  Generalized abdominal pain  Alcohol abuse  Nausea  Hematuria  UTI (lower urinary tract infection)  Syncope, near  Atypical chest pain  Shortness of breath    59 y.o. female here with multiple complaints. Primary complaint is generalized abd pain x4 days. Was seen 4 days ago for this and had a negative work up aside from U/A showing hematuria and WBC in urine without bacteria. FOBT at  that time was negative. She refused to stay for further work up and was discharged home. She also complains of hematuria, dysuria, urinary frequency, nausea, and SOB although she appears comfortable and no increased WOB or hypoxia. States she "passed out" last night, but when she describes it she states she "went to sleep and woke up". Admits to consuming alcohol. Very uncooperative with my exam, and very difficult historian. On exam, no focal neuro deficits although she does not cooperate with a full neuro exam; abd soft but diffusely tender everywhere. Will get labs, urine, acute abd series, and ethanol level. Will give modified GI cocktail, small dose of fentanyl for pain, and protonix and zofran. Will give fluids. Will get orthostatic VS. Will reassess shortly.   3:24 PM EtOH level 52. Trop neg. EKG nonischemic. CBC w/diff unremarkable. CMP unremarkable. Lipase WNL. U/A again showing TNTC WBC and TNTC RBC, nitrite neg with rare bacteria, difficult to determine if this is UTI vs gross hematuria from other causes (i.e. Bladder cancer, etc). Will send for culture and treat as UTI and have her f/up with PCP in 1 wk to check for improvement on her urine. Acute abd series unremarkable. Pain improved, nausea resolved, tolerating PO well. Will d/c home with tylenol/motrin for pain, tums/maalox/prilosec for indigestion, and bactrim for her possible UTI. Will also send home with zofran. Discussed f/up with PCP in 1 wk to ensure resolution of hematuria. I explained the diagnosis and have given explicit precautions to return to the ER including for any other new or worsening symptoms. The patient understands and accepts the medical plan as it's been dictated and I have answered their questions. Discharge instructions concerning home care and prescriptions have been given. The patient is STABLE and is discharged to home in good condition.  BP 112/65 mmHg  Pulse 53  Temp(Src) 98 F (36.7 C) (Oral)  Resp 20  Ht 5\' 4"   (1.626 m)  Wt 156 lb (70.761 kg)  BMI 26.76 kg/m2  SpO2 100%  Meds ordered this encounter  Medications  . ondansetron (ZOFRAN) injection 4 mg    Sig:   . sodium chloride 0.9 % bolus 1,000 mL    Sig:   . fentaNYL (SUBLIMAZE) injection 25 mcg    Sig:   . pantoprazole (PROTONIX) injection 40 mg    Sig:   . alum & mag hydroxide-simeth (MAALOX/MYLANTA) 200-200-20 MG/5ML suspension 15 mL    Sig:   . lidocaine (XYLOCAINE) 2 % viscous mouth solution 15 mL    Sig:   . sulfamethoxazole-trimethoprim (BACTRIM DS,SEPTRA DS) 800-160 MG per tablet    Sig: Take 1 tablet by mouth 2 (two) times daily. X 5 days    Dispense:  10 tablet    Refill:  0    Order Specific Question:  Supervising Provider    Answer:  MILLER, BRIAN [[id number]]  . ondansetron (ZOFRAN) 8 MG tablet    Sig: Take 1 tablet (8 mg total) by mouth every 8 (eight) hours as needed  for nausea or vomiting.    Dispense:  10 tablet    Refill:  0    Order Specific Question:  Supervising Provider    Answer:  Eber Hong [[id number]]     Terry Kiener Camprubi-Soms, PA-C 12/16/14 1542

## 2014-12-16 NOTE — ED Notes (Signed)
Pt. Stated, I've got stomach pain that goes to my chest,  And blood when I pee.  Last night I passed out twice.

## 2014-12-16 NOTE — Discharge Instructions (Signed)
Stay very well hydrated with plenty of water throughout the day. Take antibiotic until completed. Use zofran as needed for nausea. Avoid alcohol! May consider over the counter tums, maalox, or prilosec as needed for heartburn or indigestion. Use tylenol or motrin as needed for pain. Follow up with primary care physician in 1 week for recheck of ongoing symptoms and to recheck your urine, but return to ER for emergent changing or worsening of symptoms. Please seek immediate care if you develop the following: You develop back pain.  Your symptoms are no better, or worse in 3 days. There is severe back pain or lower abdominal pain.  You develop chills.  You have a fever.  There is nausea or vomiting.  There is continued burning or discomfort with urination.     Urinary Tract Infection A urinary tract infection (UTI) can occur any place along the urinary tract. The tract includes the kidneys, ureters, bladder, and urethra. A type of germ called bacteria often causes a UTI. UTIs are often helped with antibiotic medicine.  HOME CARE   If given, take antibiotics as told by your doctor. Finish them even if you start to feel better.  Drink enough fluids to keep your pee (urine) clear or pale yellow.  Avoid tea, drinks with caffeine, and bubbly (carbonated) drinks.  Pee often. Avoid holding your pee in for a long time.  Pee before and after having sex (intercourse).  Wipe from front to back after you poop (bowel movement) if you are a woman. Use each tissue only once. GET HELP RIGHT AWAY IF:   You have back pain.  You have lower belly (abdominal) pain.  You have chills.  You feel sick to your stomach (nauseous).  You throw up (vomit).  Your burning or discomfort with peeing does not go away.  You have a fever.  Your symptoms are not better in 3 days. MAKE SURE YOU:   Understand these instructions.  Will watch your condition.  Will get help right away if you are not doing well  or get worse. Document Released: 11/18/2007 Document Revised: 02/24/2012 Document Reviewed: 12/31/2011 [facility] Patient Information 2015 [place], [place]. This information is not intended to replace advice given to you by your health care provider. Make sure you discuss any questions you have with your health care provider.  Abdominal Pain, Women Abdominal (stomach, pelvic, or belly) pain can be caused by many things. It is important to tell your doctor:  The location of the pain.  Does it come and go or is it present all the time?  Are there things that start the pain (eating certain foods, exercise)?  Are there other symptoms associated with the pain (fever, nausea, vomiting, diarrhea)? All of this is helpful to know when trying to find the cause of the pain. CAUSES   Stomach: virus or bacteria infection, or ulcer.  Intestine: appendicitis (inflamed appendix), regional ileitis (Crohn's disease), ulcerative colitis (inflamed colon), irritable bowel syndrome, diverticulitis (inflamed diverticulum of the colon), or cancer of the stomach or intestine.  Gallbladder disease or stones in the gallbladder.  Kidney disease, kidney stones, or infection.  Pancreas infection or cancer.  Fibromyalgia (pain disorder).  Diseases of the female organs:  Uterus: fibroid (non-cancerous) tumors or infection.  Fallopian tubes: infection or tubal pregnancy.  Ovary: cysts or tumors.  Pelvic adhesions (scar tissue).  Endometriosis (uterus lining tissue growing in the pelvis and on the pelvic organs).  Pelvic congestion syndrome (female organs filling up with blood just  before the menstrual period).  Pain with the menstrual period.  Pain with ovulation (producing an egg).  Pain with an IUD (intrauterine device, birth control) in the uterus.  Cancer of the female organs.  Functional pain (pain not caused by a disease, may improve without treatment).  Psychological  pain.  Depression. DIAGNOSIS  Your doctor will decide the seriousness of your pain by doing an examination.  Blood tests.  X-rays.  Ultrasound.  CT scan (computed tomography, special type of X-ray).  MRI (magnetic resonance imaging).  Cultures, for infection.  Barium enema (dye inserted in the large intestine, to better view it with X-rays).  Colonoscopy (looking in intestine with a lighted tube).  Laparoscopy (minor surgery, looking in abdomen with a lighted tube).  Major abdominal exploratory surgery (looking in abdomen with a large incision). TREATMENT  The treatment will depend on the cause of the pain.   Many cases can be observed and treated at home.  Over-the-counter medicines recommended by your caregiver.  Prescription medicine.  Antibiotics, for infection.  Birth control pills, for painful periods or for ovulation pain.  Hormone treatment, for endometriosis.  Nerve blocking injections.  Physical therapy.  Antidepressants.  Counseling with a psychologist or psychiatrist.  Minor or major surgery. HOME CARE INSTRUCTIONS   Do not take laxatives, unless directed by your caregiver.  Take over-the-counter pain medicine only if ordered by your caregiver. Do not take aspirin because it can cause an upset stomach or bleeding.  Try a clear liquid diet (broth or water) as ordered by your caregiver. Slowly move to a bland diet, as tolerated, if the pain is related to the stomach or intestine.  Have a thermometer and take your temperature several times a day, and record it.  Bed rest and sleep, if it helps the pain.  Avoid sexual intercourse, if it causes pain.  Avoid stressful situations.  Keep your follow-up appointments and tests, as your caregiver orders.  If the pain does not go away with medicine or surgery, you may try:  Acupuncture.  Relaxation exercises (yoga, meditation).  Group therapy.  Counseling. SEEK MEDICAL CARE IF:   You  notice certain foods cause stomach pain.  Your home care treatment is not helping your pain.  You need stronger pain medicine.  You want your IUD removed.  You feel faint or lightheaded.  You develop nausea and vomiting.  You develop a rash.  You are having side effects or an allergy to your medicine. SEEK IMMEDIATE MEDICAL CARE IF:   Your pain does not go away or gets worse.  You have a fever.  Your pain is felt only in portions of the abdomen. The right side could possibly be appendicitis. The left lower portion of the abdomen could be colitis or diverticulitis.  You are passing blood in your stools (bright red or black tarry stools, with or without vomiting).  You have blood in your urine.  You develop chills, with or without a fever.  You pass out. MAKE SURE YOU:   Understand these instructions.  Will watch your condition.  Will get help right away if you are not doing well or get worse. Document Released: 03/29/2007 Document Revised: 10/16/2013 Document Reviewed: 04/18/2009 [facility] Patient Information 2015 [place], [place]. This information is not intended to replace advice given to you by your health care provider. Make sure you discuss any questions you have with your health care provider.  Chest Pain (Nonspecific) It is often hard to give a specific diagnosis for  the cause of chest pain. There is always a chance that your pain could be related to something serious, such as a heart attack or a blood clot in the lungs. You need to follow up with your health care provider for further evaluation. CAUSES   Heartburn.  Pneumonia or bronchitis.  Anxiety or stress.  Inflammation around your heart (pericarditis) or lung (pleuritis or pleurisy).  A blood clot in the lung.  A collapsed lung (pneumothorax). It can develop suddenly on its own (spontaneous pneumothorax) or from trauma to the chest.  Shingles infection (herpes zoster virus). The chest wall is  composed of bones, muscles, and cartilage. Any of these can be the source of the pain.  The bones can be bruised by injury.  The muscles or cartilage can be strained by coughing or overwork.  The cartilage can be affected by inflammation and become sore (costochondritis). DIAGNOSIS  Lab tests or other studies may be needed to find the cause of your pain. Your health care provider may have you take a test called an ambulatory electrocardiogram (ECG). An ECG records your heartbeat patterns over a 24-hour period. You may also have other tests, such as:  Transthoracic echocardiogram (TTE). During echocardiography, sound waves are used to evaluate how blood flows through your heart.  Transesophageal echocardiogram (TEE).  Cardiac monitoring. This allows your health care provider to monitor your heart rate and rhythm in real time.  Holter monitor. This is a portable device that records your heartbeat and can help diagnose heart arrhythmias. It allows your health care provider to track your heart activity for several days, if needed.  Stress tests by exercise or by giving medicine that makes the heart beat faster. TREATMENT   Treatment depends on what may be causing your chest pain. Treatment may include:  Acid blockers for heartburn.  Anti-inflammatory medicine.  Pain medicine for inflammatory conditions.  Antibiotics if an infection is present.  You may be advised to change lifestyle habits. This includes stopping smoking and avoiding alcohol, caffeine, and chocolate.  You may be advised to keep your head raised (elevated) when sleeping. This reduces the chance of acid going backward from your stomach into your esophagus. Most of the time, nonspecific chest pain will improve within 2-3 days with rest and mild pain medicine.  HOME CARE INSTRUCTIONS   If antibiotics were prescribed, take them as directed. Finish them even if you start to feel better.  For the next few days, avoid  physical activities that bring on chest pain. Continue physical activities as directed.  Do not use any tobacco products, including cigarettes, chewing tobacco, or electronic cigarettes.  Avoid drinking alcohol.  Only take medicine as directed by your health care provider.  Follow your health care provider's suggestions for further testing if your chest pain does not go away.  Keep any follow-up appointments you made. If you do not go to an appointment, you could develop lasting (chronic) problems with pain. If there is any problem keeping an appointment, call to reschedule. SEEK MEDICAL CARE IF:   Your chest pain does not go away, even after treatment.  You have a rash with blisters on your chest.  You have a fever. SEEK IMMEDIATE MEDICAL CARE IF:   You have increased chest pain or pain that spreads to your arm, neck, jaw, back, or abdomen.  You have shortness of breath.  You have an increasing cough, or you cough up blood.  You have severe back or abdominal pain.  You  feel nauseous or vomit.  You have severe weakness.  You faint.  You have chills. This is an emergency. Do not wait to see if the pain will go away. Get medical help at once. Call your local emergency services (911 in [place] Do not drive yourself to the hospital. MAKE SURE YOU:   Understand these instructions.  Will watch your condition.  Will get help right away if you are not doing well or get worse. Document Released: 03/11/2005 Document Revised: 06/06/2013 Document Reviewed: 01/05/2008 [facility] Patient Information 2015 [place], [place]. This information is not intended to replace advice given to you by your health care provider. Make sure you discuss any questions you have with your health care provider.  Alcohol Intoxication Alcohol intoxication occurs when the amount of alcohol that a person has consumed impairs his or her ability to mentally and physically function. Alcohol directly impairs the  normal chemical activity of the brain. Drinking large amounts of alcohol can lead to changes in mental function and behavior, and it can cause many physical effects that can be harmful.  Alcohol intoxication can range in severity from mild to very severe. Various factors can affect the level of intoxication that occurs, such as the person's age, gender, weight, frequency of alcohol consumption, and the presence of other medical conditions (such as diabetes, seizures, or heart conditions). Dangerous levels of alcohol intoxication may occur when people drink large amounts of alcohol in a short period (binge drinking). Alcohol can also be especially dangerous when combined with certain prescription medicines or "recreational" drugs. SIGNS AND SYMPTOMS Some common signs and symptoms of mild alcohol intoxication include:  Loss of coordination.  Changes in mood and behavior.  Impaired judgment.  Slurred speech. As alcohol intoxication progresses to more severe levels, other signs and symptoms will appear. These may include:  Vomiting.  Confusion and impaired memory.  Slowed breathing.  Seizures.  Loss of consciousness. DIAGNOSIS  Your health care provider will take a medical history and perform a physical exam. You will be asked about the amount and type of alcohol you have consumed. Blood tests will be done to measure the concentration of alcohol in your blood. In many places, your blood alcohol level must be lower than 80 mg/dL (4.08%) to legally drive. However, many dangerous effects of alcohol can occur at much lower levels.  TREATMENT  People with alcohol intoxication often do not require treatment. Most of the effects of alcohol intoxication are temporary, and they go away as the alcohol naturally leaves the body. Your health care provider will monitor your condition until you are stable enough to go home. Fluids are sometimes given through an IV access tube to help prevent dehydration.   HOME CARE INSTRUCTIONS  Do not drive after drinking alcohol.  Stay hydrated. Drink enough water and fluids to keep your urine clear or pale yellow. Avoid caffeine.   Only take over-the-counter or prescription medicines as directed by your health care provider.  SEEK MEDICAL CARE IF:   You have persistent vomiting.   You do not feel better after a few days.  You have frequent alcohol intoxication. Your health care provider can help determine if you should see a substance use treatment counselor. SEEK IMMEDIATE MEDICAL CARE IF:   You become shaky or tremble when you try to stop drinking.   You shake uncontrollably (seizure).   You throw up (vomit) blood. This may be bright red or may look like black coffee grounds.   You have blood  in your stool. This may be bright red or may appear as a black, tarry, bad smelling stool.   You become lightheaded or faint.  MAKE SURE YOU:   Understand these instructions.  Will watch your condition.  Will get help right away if you are not doing well or get worse. Document Released: 03/11/2005 Document Revised: 02/01/2013 Document Reviewed: 11/04/2012 [facility] Patient Information 2015 [place], [place]. This information is not intended to replace advice given to you by your health care provider. Make sure you discuss any questions you have with your health care provider.  How Much is Too Much Alcohol? Drinking too much alcohol can cause injury, accidents, and health problems. These types of problems can include:   Car crashes.  Falls.  Family fighting (domestic violence).  Drowning.  Fights.  Injuries.  Burns.  Damage to certain organs.  Having a baby with birth defects. ONE DRINK CAN BE TOO MUCH WHEN YOU ARE:  Working.  Pregnant or breastfeeding.  Taking medicines. Ask your doctor.  Driving or planning to drive. WHAT IS A STANDARD DRINK?   1 regular beer (12 ounces or 360 milliliters).  1 glass of wine (5  ounces or 150 milliliters).  1 shot of liquor (1.5 ounces or 45 milliliters). BLOOD ALCOHOL LEVELS   .00 A person is sober.  Marland Kitchen03 A person has no trouble keeping balance, talking, or seeing right, but a "buzz" may be felt.  Marland Kitchen05 A person feels "buzzed" and relaxed.  Marland Kitchen08 or .10  A person is drunk. He or she has trouble talking, seeing right, and keeping his or her balance.  .15 A person loses body control and may pass out (blackout).  .20 A person has trouble walking (staggering) and throws up (vomits).  .30 A person will pass out (unconscious).  .40+ A person will be in a coma. Death is possible. If you or someone you know has a drinking problem, get help from a doctor.  Document Released: 03/28/2009 Document Revised: 08/24/2011 Document Reviewed: 03/28/2009 [facility] Patient Information 2015 [place], [place]. This information is not intended to replace advice given to you by your health care provider. Make sure you discuss any questions you have with your health care provider.  Hematuria Hematuria is blood in your urine. It can be caused by a bladder infection, kidney infection, prostate infection, kidney stone, or cancer of your urinary tract. Infections can usually be treated with medicine, and a kidney stone usually will pass through your urine. If neither of these is the cause of your hematuria, further workup to find out the reason may be needed. It is very important that you tell your health care provider about any blood you see in your urine, even if the blood stops without treatment or happens without causing pain. Blood in your urine that happens and then stops and then happens again can be a symptom of a very serious condition. Also, pain is not a symptom in the initial stages of many urinary cancers. HOME CARE INSTRUCTIONS   Drink lots of fluid, 3-4 quarts a day. If you have been diagnosed with an infection, cranberry juice is especially recommended, in addition to large amounts  of water.  Avoid caffeine, tea, and carbonated beverages because they tend to irritate the bladder.  Avoid alcohol because it may irritate the prostate.  Take all medicines as directed by your health care provider.  If you were prescribed an antibiotic medicine, finish it all even if you start to feel better.  If  you have been diagnosed with a kidney stone, follow your health care provider's instructions regarding straining your urine to catch the stone.  Empty your bladder often. Avoid holding urine for long periods of time.  After a bowel movement, women should cleanse front to back. Use each tissue only once.  Empty your bladder before and after sexual intercourse if you are a female. SEEK MEDICAL CARE IF:  You develop back pain.  You have a fever.  You have a feeling of sickness in your stomach (nausea) or vomiting.  Your symptoms are not better in 3 days. Return sooner if you are getting worse. SEEK IMMEDIATE MEDICAL CARE IF:   You develop severe vomiting and are unable to keep the medicine down.  You develop severe back or abdominal pain despite taking your medicines.  You begin passing a large amount of blood or clots in your urine.  You feel extremely weak or faint, or you pass out. MAKE SURE YOU:   Understand these instructions.  Will watch your condition.  Will get help right away if you are not doing well or get worse. Document Released: 06/01/2005 Document Revised: 10/16/2013 Document Reviewed: 01/30/2013 [facility] Patient Information 2015 [place], [place]. This information is not intended to replace advice given to you by your health care provider. Make sure you discuss any questions you have with your health care provider.

## 2014-12-16 NOTE — ED Provider Notes (Signed)
Medical screening examination/treatment/procedure(s) were conducted as a shared visit with non-physician practitioner(s) and myself.  I personally evaluated the patient during the encounter.   EKG Interpretation   Date/Time:  Sunday December 16 2014 12:16:28 EDT Ventricular Rate:  80 PR Interval:  146 QRS Duration: 70 QT Interval:  352 QTC Calculation: 405 R Axis:   57 Text Interpretation:  Normal sinus rhythm Anterior infarct , age  undetermined Abnormal ECG Confirmed by Gladine Plude  MD, Royce Sciara [id number]) on  12/16/2014 12:39:52 PM      Results for orders placed or performed during the hospital encounter of 12/16/14  Urinalysis, Routine w reflex microscopic (not at [facility])  Result Value Ref Range   Color, Urine RED (A) YELLOW   APPearance TURBID (A) CLEAR   Specific Gravity, Urine 1.020 1.005 - 1.030   pH 5.5 5.0 - 8.0   Glucose, UA NEGATIVE NEGATIVE mg/dL   Hgb urine dipstick LARGE (A) NEGATIVE   Bilirubin Urine SMALL (A) NEGATIVE   Ketones, ur 15 (A) NEGATIVE mg/dL   Protein, ur 400 (A) NEGATIVE mg/dL   Urobilinogen, UA 1.0 0.0 - 1.0 mg/dL   Nitrite NEGATIVE NEGATIVE   Leukocytes, UA LARGE (A) NEGATIVE  CBC with Differential  Result Value Ref Range   WBC 4.9 4.0 - 10.5 K/uL   RBC 4.37 3.87 - 5.11 MIL/uL   Hemoglobin 14.0 12.0 - 15.0 g/dL   HCT 86.7 61.9 - 50.9 %   MCV 92.2 78.0 - 100.0 fL   MCH 32.0 26.0 - 34.0 pg   MCHC 34.7 30.0 - 36.0 g/dL   RDW 32.6 71.2 - 45.8 %   Platelets 209 150 - 400 K/uL   Neutrophils Relative % 59 43 - 77 %   Neutro Abs 2.9 1.7 - 7.7 K/uL   Lymphocytes Relative 31 12 - 46 %   Lymphs Abs 1.5 0.7 - 4.0 K/uL   Monocytes Relative 9 3 - 12 %   Monocytes Absolute 0.4 0.1 - 1.0 K/uL   Eosinophils Relative 1 0 - 5 %   Eosinophils Absolute 0.1 0.0 - 0.7 K/uL   Basophils Relative 0 0 - 1 %   Basophils Absolute 0.0 0.0 - 0.1 K/uL  Comprehensive metabolic panel  Result Value Ref Range   Sodium 134 (L) 135 - 145 mmol/L   Potassium 3.6 3.5 - 5.1 mmol/L   Chloride 103 101 - 111 mmol/L   CO2 20 (L) 22 - 32 mmol/L   Glucose, Bld 88 65 - 99 mg/dL   BUN 10 6 - 20 mg/dL   Creatinine, Ser 0.99 (H) 0.44 - 1.00 mg/dL   Calcium 8.7 (L) 8.9 - 10.3 mg/dL   Total Protein 6.5 6.5 - 8.1 g/dL   Albumin 3.4 (L) 3.5 - 5.0 g/dL   AST 19 15 - 41 U/L   ALT 12 (L) 14 - 54 U/L   Alkaline Phosphatase 69 38 - 126 U/L   Total Bilirubin <0.1 (L) 0.3 - 1.2 mg/dL   GFR calc non Af Amer 58 (L) >60 mL/min   GFR calc Af Amer >60 >60 mL/min   Anion gap 11 5 - 15  Lipase, blood  Result Value Ref Range   Lipase 17 (L) 22 - 51 U/L  Ethanol  Result Value Ref Range   Alcohol, Ethyl (B) 52 (H) <5 mg/dL  Urine microscopic-add on  Result Value Ref Range   Squamous Epithelial / LPF FEW (A) RARE   WBC, UA TOO NUMEROUS TO COUNT <3 WBC/hpf  RBC / HPF TOO NUMEROUS TO COUNT <3 RBC/hpf   Bacteria, UA RARE RARE  I-stat troponin, ED  Result Value Ref Range   Troponin i, poc 0.00 0.00 - 0.08 ng/mL   Comment 3           Dg Abd Acute W/chest  12/16/2014   CLINICAL DATA:  Generalized chest pain.  Abdominal pain.  EXAM: DG ABDOMEN ACUTE W/ 1V CHEST  COMPARISON:  None.  FINDINGS: There is no evidence of dilated bowel loops or free intraperitoneal air. No radiopaque calculi or other significant radiographic abnormality is seen. Heart size and mediastinal contours are within normal limits. Both lungs are clear.  IMPRESSION: Negative abdominal radiographs.  No acute cardiopulmonary disease.   Electronically Signed   By: Andreas Newport M.D.   On: 12/16/2014 15:07    Patient's workup here today concerning for persistent hematuria and 2 numbers to count white cells in the urine. She was here yesterday with similar urinalysis findings with the left. No urine culture was sent. Today we'll send a urine culture and treat her for probably urinary tract infection. Follow-up with her regular doctor in a week to verify that the blood is clearing is important. Patient will also follow-up if not  improving in a couple days. Rest the patient's workup without any significant findings. Acute abdominal series here is negative no significant electrolyte abnormalities. No leukocytosis no anemia. Patient's abdomen without significant tenderness. Patient's alert nontoxic no acute distress.  Vanetta Mulders, MD 12/16/14 [phone]

## 2014-12-17 LAB — URINE CULTURE

## 2015-03-18 ENCOUNTER — Emergency Department (HOSPITAL_COMMUNITY)
Admission: EM | Admit: 2015-03-18 | Discharge: 2015-03-19 | Disposition: A | Discharge status: Other Acute Inpatient Hospital | Payer: Medicare Other | Attending: Emergency Medicine | Admitting: Emergency Medicine

## 2015-03-18 DIAGNOSIS — Z046 Encounter for general psychiatric examination, requested by authority: Secondary | ICD-10-CM | POA: Diagnosis present

## 2015-03-18 DIAGNOSIS — F1410 Cocaine abuse, uncomplicated: Secondary | ICD-10-CM | POA: Insufficient documentation

## 2015-03-18 DIAGNOSIS — F250 Schizoaffective disorder, bipolar type: Secondary | ICD-10-CM | POA: Diagnosis not present

## 2015-03-18 DIAGNOSIS — R45850 Homicidal ideations: Secondary | ICD-10-CM

## 2015-03-18 DIAGNOSIS — Z79899 Other long term (current) drug therapy: Secondary | ICD-10-CM | POA: Diagnosis not present

## 2015-03-18 DIAGNOSIS — M1990 Unspecified osteoarthritis, unspecified site: Secondary | ICD-10-CM | POA: Insufficient documentation

## 2015-03-18 DIAGNOSIS — Z7982 Long term (current) use of aspirin: Secondary | ICD-10-CM | POA: Diagnosis not present

## 2015-03-18 DIAGNOSIS — Z720 Tobacco use: Secondary | ICD-10-CM | POA: Diagnosis not present

## 2015-03-18 LAB — CBC
HEMATOCRIT: 37.6 % (ref 36.0–46.0)
HEMOGLOBIN: 12.8 g/dL (ref 12.0–15.0)
MCH: 31.4 pg (ref 26.0–34.0)
MCHC: 34 g/dL (ref 30.0–36.0)
MCV: 92.2 fL (ref 78.0–100.0)
Platelets: 198 10*3/uL (ref 150–400)
RBC: 4.08 MIL/uL (ref 3.87–5.11)
RDW: 14.2 % (ref 11.5–15.5)
WBC: 4.7 10*3/uL (ref 4.0–10.5)

## 2015-03-18 LAB — ETHANOL: Alcohol, Ethyl (B): <5 mg/dL (ref <5)

## 2015-03-18 LAB — COMPREHENSIVE METABOLIC PANEL
ALK PHOS: 72 U/L (ref 38–126)
ALT: 11 U/L — ABNORMAL LOW (ref 14–54)
AST: 29 U/L (ref 15–41)
Albumin: 3.8 g/dL (ref 3.5–5.0)
Anion gap: 11 (ref 5–15)
BUN: 14 mg/dL (ref 6–20)
CALCIUM: 8.7 mg/dL — AB (ref 8.9–10.3)
CO2: 22 mmol/L (ref 22–32)
Chloride: 101 mmol/L (ref 101–111)
Creatinine, Ser: 0.91 mg/dL (ref 0.44–1.00)
GFR calc Af Amer: >60 mL/min (ref >60)
GFR calc non Af Amer: >60 mL/min (ref >60)
Glucose, Bld: 190 mg/dL — ABNORMAL HIGH (ref 65–99)
POTASSIUM: 3.2 mmol/L — AB (ref 3.5–5.1)
Sodium: 134 mmol/L — ABNORMAL LOW (ref 135–145)
Total Bilirubin: <0.1 mg/dL — ABNORMAL LOW (ref 0.3–1.2)
Total Protein: 7.1 g/dL (ref 6.5–8.1)

## 2015-03-18 LAB — SALICYLATE LEVEL: Salicylate Lvl: 15.1 mg/dL (ref 2.8–30.0)

## 2015-03-18 LAB — ACETAMINOPHEN LEVEL: Acetaminophen (Tylenol), Serum: <10 ug/mL — ABNORMAL LOW (ref 10–30)

## 2015-03-18 MED ORDER — POTASSIUM CHLORIDE CRYS ER 20 MEQ PO TBCR
20.0000 meq | EXTENDED_RELEASE_TABLET | Freq: Once | ORAL | Status: AC
  Administered 2015-03-18: 20 meq via ORAL
  Filled 2015-03-18: qty 1

## 2015-03-18 NOTE — ED Notes (Signed)
Pt BIB [facility] from [facility] will not take her back).    IVC papers state: Respondent is a 59 y/o AA female with a dx of schizoaffective disorder bipolar type.  Respondent currently reports hearing auditory hallucinations commanding her to kill her roommate stating "I just keep thinking of ways I could do it".  She is out of her medications risperdal, trileptol, neurontin.  Respondent reports using cocaine and drinking a fifth of liquor last night.  She is considered a danger to others.
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
## 2015-03-18 NOTE — ED Provider Notes (Signed)
CSN: [id number]     Arrival date & time 03/18/15  1726 History   First MD Initiated Contact with Patient 03/18/15 1733     Chief Complaint  Patient presents with  . IVC    . Hallucinations     The history is provided by the patient. No language interpreter was used.   [name] presents for psychiatric evaluation. She has a history of bipolar schizophrenia and has been off her medications for the last week because she's been out of them. She comes in with IVC papers filled out by her Child psychotherapist. She states that she's having suicidal thoughts and homicidal thoughts. She states she wants to be here to be safe and not hurt people. She is also requesting something to eat. She denies any other illnesses or recent injuries.  Past Medical History  Diagnosis Date  . Ectopic pregnancy   . Arthritis    Past Surgical History  Procedure Laterality Date  . Abdominal surgery    . Tumor removal      Uterine tumor removed   No family history on file. Social History  Substance Use Topics  . Smoking status: Current Every Day Smoker -- 0.25 packs/day    Types: Cigarettes, Cigars  . Smokeless tobacco: Not on file  . Alcohol Use: Yes     Comment: occ   OB History    No data available     Review of Systems  All other systems reviewed and are negative.     Allergies  Tetracyclines & related  Home Medications   Prior to Admission medications   Medication Sig Start Date End Date Taking? Authorizing Provider  aspirin 325 MG tablet Take 325 mg by mouth daily.   Yes Historical Provider, MD  Aspirin-Salicylamide-Caffeine (BC HEADACHE POWDER PO) Take 1 packet by mouth daily as needed (headache).   Yes Historical Provider, MD  gabapentin (NEURONTIN) 300 MG capsule Take 300 mg by mouth 3 (three) times daily.   Yes Historical Provider, MD  HYDROcodone-acetaminophen (NORCO) 10-325 MG per tablet Take 2 tablets by mouth every 6 (six) hours as needed.   Yes Historical Provider, MD   Oxcarbazepine (TRILEPTAL) 300 MG tablet Take 300 mg by mouth 3 (three) times daily.   Yes Historical Provider, MD  PROAIR HFA 108 (90 BASE) MCG/ACT inhaler Inhale 1-2 puffs into the lungs every 4 (four) hours as needed for wheezing or shortness of breath.  04/03/14  Yes Historical Provider, MD  risperiDONE (RISPERDAL) 1 MG tablet Take 1 mg by mouth 2 (two) times daily.   Yes Historical Provider, MD  ondansetron (ZOFRAN) 8 MG tablet Take 1 tablet (8 mg total) by mouth every 8 (eight) hours as needed for nausea or vomiting. Patient not taking: Reported on 03/18/2015 12/16/14   Terry Camprubi-Soms, PA-C  sulfamethoxazole-trimethoprim (BACTRIM DS,SEPTRA DS) 800-160 MG per tablet Take 1 tablet by mouth 2 (two) times daily. X 5 days Patient not taking: Reported on 03/18/2015 12/16/14   Terry Camprubi-Soms, PA-C   BP 140/88 mmHg  Pulse 105  Temp(Src) 98.5 F (36.9 C) (Oral)  Resp 18  SpO2 100% Physical Exam  Constitutional: She is oriented to person, place, and time. She appears well-developed and well-nourished.  HENT:  Head: Normocephalic and atraumatic.  Cardiovascular: Normal rate and regular rhythm.   No murmur heard. Pulmonary/Chest: Effort normal and breath sounds normal. No respiratory distress.  Abdominal: Soft. There is no tenderness. There is no rebound and no guarding.  Musculoskeletal: She exhibits no edema  or tenderness.  Neurological: She is alert and oriented to person, place, and time.  Skin: Skin is warm and dry.  Psychiatric:  Bizarre affect. Reports SI, HI.  Nursing note and vitals reviewed.   ED Course  Procedures (including critical care time) Labs Review Labs Reviewed  COMPREHENSIVE METABOLIC PANEL - Abnormal; Notable for the following:    Sodium 134 (*)    Potassium 3.2 (*)    Glucose, Bld 190 (*)    Calcium 8.7 (*)    ALT 11 (*)    Total Bilirubin <0.1 (*)    All other components within normal limits  ACETAMINOPHEN LEVEL - Abnormal; Notable for the  following:    Acetaminophen (Tylenol), Serum <10 (*)    All other components within normal limits  ETHANOL  SALICYLATE LEVEL  CBC  URINE RAPID DRUG SCREEN, HOSP PERFORMED    Imaging Review No results found. I have personally reviewed and evaluated these images and lab results as part of my medical decision-making.   EKG Interpretation None      MDM   Final diagnoses:  Homicidal ideation    Patient with known psychiatric disorder here with IVC papers by her social worker for SI, HI. She states she's been off her medications for the last week. Patient has been medically cleared for psychiatric evaluation.    Tilden Fossa, MD 03/18/15 [phone]

## 2015-03-19 DIAGNOSIS — F250 Schizoaffective disorder, bipolar type: Secondary | ICD-10-CM

## 2015-03-19 LAB — RAPID URINE DRUG SCREEN, HOSP PERFORMED
Amphetamines: NOT DETECTED
BENZODIAZEPINES: NOT DETECTED
Barbiturates: NOT DETECTED
COCAINE: POSITIVE — AB
OPIATES: NOT DETECTED
Tetrahydrocannabinol: NOT DETECTED

## 2015-03-19 MED ORDER — ASPIRIN 325 MG PO TABS
325.0000 mg | ORAL_TABLET | Freq: Every day | ORAL | Status: DC
  Administered 2015-03-19: 325 mg via ORAL
  Filled 2015-03-19: qty 1

## 2015-03-19 MED ORDER — RISPERIDONE 1 MG PO TABS
1.0000 mg | ORAL_TABLET | Freq: Two times a day (BID) | ORAL | Status: DC
  Administered 2015-03-19: 1 mg via ORAL
  Filled 2015-03-19: qty 1

## 2015-03-19 MED ORDER — NICOTINE POLACRILEX 2 MG MT GUM
2.0000 mg | CHEWING_GUM | OROMUCOSAL | Status: DC | PRN
  Administered 2015-03-19: 2 mg via ORAL
  Filled 2015-03-19: qty 1

## 2015-03-19 MED ORDER — NICOTINE 21 MG/24HR TD PT24
21.0000 mg | MEDICATED_PATCH | Freq: Once | TRANSDERMAL | Status: DC
  Administered 2015-03-19: 21 mg via TRANSDERMAL
  Filled 2015-03-19: qty 1
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
## 2015-03-19 NOTE — BH Assessment (Addendum)
Patient signed the consent to release of information to [facility].  Writer contacted [employer] to obtain collateral information.  Per Amellia at [place] [phone].  The patient does receives medication management and outpatient therapy services.  Per Lum Babe the patient has missed two appointments and the last time that she was in their office was on January 07, 2015. Per Erskin Burnet does have social worker in their crisis center.

## 2015-03-19 NOTE — ED Notes (Signed)
Patient is resting comfortably. 

## 2015-03-19 NOTE — BH Assessment (Signed)
Per Christiane Ha from [facility], patient accepted for inpatient treatment. The accepting provider is Dr. Wendall Stade. Nursing report [phone]. Patient is under IVC and sheriff will transport to the facility. Patient to present to the [facility] ([facility]). Patient's nurse updated about patient's disposition.

## 2015-03-19 NOTE — BH Assessment (Signed)
Patient verifies that she does not participate in ACT services with [facility]. She reports that she presented to [facility] yesterday as a walk-in. Her purpose for going to [facility] yesterday was for medication management. Sts that after speaking with a crises counselor she was IVC'd and transferred to [facility].

## 2015-06-05 ENCOUNTER — Emergency Department (HOSPITAL_COMMUNITY)
Admission: EM | Admit: 2015-06-05 | Discharge: 2015-06-05 | Disposition: A | Discharge status: Home / Self Care | Payer: Medicare Other | Attending: Emergency Medicine | Admitting: Emergency Medicine

## 2015-06-05 ENCOUNTER — Encounter (HOSPITAL_COMMUNITY): Payer: Self-pay | Admitting: Emergency Medicine

## 2015-06-05 DIAGNOSIS — R51 Headache: Secondary | ICD-10-CM | POA: Diagnosis not present

## 2015-06-05 DIAGNOSIS — R197 Diarrhea, unspecified: Secondary | ICD-10-CM | POA: Diagnosis not present

## 2015-06-05 DIAGNOSIS — M1990 Unspecified osteoarthritis, unspecified site: Secondary | ICD-10-CM | POA: Diagnosis not present

## 2015-06-05 DIAGNOSIS — F17210 Nicotine dependence, cigarettes, uncomplicated: Secondary | ICD-10-CM | POA: Insufficient documentation

## 2015-06-05 DIAGNOSIS — R109 Unspecified abdominal pain: Secondary | ICD-10-CM | POA: Diagnosis present

## 2015-06-05 DIAGNOSIS — Z7982 Long term (current) use of aspirin: Secondary | ICD-10-CM | POA: Diagnosis not present

## 2015-06-05 DIAGNOSIS — N898 Other specified noninflammatory disorders of vagina: Secondary | ICD-10-CM | POA: Insufficient documentation

## 2015-06-05 DIAGNOSIS — Z3202 Encounter for pregnancy test, result negative: Secondary | ICD-10-CM | POA: Insufficient documentation

## 2015-06-05 DIAGNOSIS — N390 Urinary tract infection, site not specified: Secondary | ICD-10-CM | POA: Diagnosis not present

## 2015-06-05 DIAGNOSIS — R1084 Generalized abdominal pain: Secondary | ICD-10-CM

## 2015-06-05 DIAGNOSIS — Z79899 Other long term (current) drug therapy: Secondary | ICD-10-CM | POA: Diagnosis not present

## 2015-06-05 DIAGNOSIS — M545 Low back pain: Secondary | ICD-10-CM | POA: Insufficient documentation

## 2015-06-05 LAB — COMPREHENSIVE METABOLIC PANEL
ALK PHOS: 82 U/L (ref 38–126)
ALT: 9 U/L — AB (ref 14–54)
ANION GAP: 9 (ref 5–15)
AST: 17 U/L (ref 15–41)
Albumin: 3.3 g/dL — ABNORMAL LOW (ref 3.5–5.0)
BILIRUBIN TOTAL: 0.3 mg/dL (ref 0.3–1.2)
BUN: 12 mg/dL (ref 6–20)
CALCIUM: 9 mg/dL (ref 8.9–10.3)
CO2: 27 mmol/L (ref 22–32)
CREATININE: 1 mg/dL (ref 0.44–1.00)
Chloride: 103 mmol/L (ref 101–111)
GFR calc non Af Amer: 60 mL/min — ABNORMAL LOW (ref >60)
Glucose, Bld: 121 mg/dL — ABNORMAL HIGH (ref 65–99)
Potassium: 4.2 mmol/L (ref 3.5–5.1)
Sodium: 139 mmol/L (ref 135–145)
TOTAL PROTEIN: 6.7 g/dL (ref 6.5–8.1)

## 2015-06-05 LAB — URINALYSIS, ROUTINE W REFLEX MICROSCOPIC
Bilirubin Urine: NEGATIVE
GLUCOSE, UA: NEGATIVE mg/dL
KETONES UR: NEGATIVE mg/dL
NITRITE: NEGATIVE
PROTEIN: NEGATIVE mg/dL
Specific Gravity, Urine: 1.019 (ref 1.005–1.030)
pH: 6.5 (ref 5.0–8.0)

## 2015-06-05 LAB — CBC WITH DIFFERENTIAL/PLATELET
BASOS ABS: 0 10*3/uL (ref 0.0–0.1)
BASOS PCT: 1 %
EOS ABS: 0 10*3/uL (ref 0.0–0.7)
Eosinophils Relative: 1 %
HEMATOCRIT: 40.1 % (ref 36.0–46.0)
Hemoglobin: 13.5 g/dL (ref 12.0–15.0)
Lymphocytes Relative: 46 %
Lymphs Abs: 2.2 10*3/uL (ref 0.7–4.0)
MCH: 31.9 pg (ref 26.0–34.0)
MCHC: 33.7 g/dL (ref 30.0–36.0)
MCV: 94.8 fL (ref 78.0–100.0)
MONO ABS: 0.3 10*3/uL (ref 0.1–1.0)
Monocytes Relative: 6 %
NEUTROS ABS: 2.3 10*3/uL (ref 1.7–7.7)
NEUTROS PCT: 46 %
Platelets: 168 10*3/uL (ref 150–400)
RBC: 4.23 MIL/uL (ref 3.87–5.11)
RDW: 15.2 % (ref 11.5–15.5)
WBC: 4.8 10*3/uL (ref 4.0–10.5)

## 2015-06-05 LAB — URINE MICROSCOPIC-ADD ON

## 2015-06-05 LAB — WET PREP, GENITAL
Clue Cells Wet Prep HPF POC: NONE SEEN
SPERM: NONE SEEN
TRICH WET PREP: NONE SEEN
Yeast Wet Prep HPF POC: NONE SEEN

## 2015-06-05 LAB — POC URINE PREG, ED: Preg Test, Ur: NEGATIVE

## 2015-06-05 LAB — LIPASE, BLOOD: Lipase: 29 U/L (ref 11–51)

## 2015-06-05 MED ORDER — HYDROCODONE-ACETAMINOPHEN 5-325 MG PO TABS
2.0000 | ORAL_TABLET | Freq: Once | ORAL | Status: AC
  Administered 2015-06-05: 2 via ORAL
  Filled 2015-06-05: qty 2

## 2015-06-05 MED ORDER — LIDOCAINE-EPINEPHRINE (PF) 2 %-1:200000 IJ SOLN: 10.0000 mL | Freq: Once | INTRAMUSCULAR | Status: DC

## 2015-06-05 MED ORDER — LIDOCAINE 5 % EX PTCH
3.0000 | MEDICATED_PATCH | CUTANEOUS | Status: DC
  Administered 2015-06-05: 3 via TRANSDERMAL
  Filled 2015-06-05: qty 3

## 2015-06-05 MED ORDER — LIDOCAINE VISCOUS 2 % MT SOLN
15.0000 mL | Freq: Once | OROMUCOSAL | Status: AC
  Administered 2015-06-05: 15 mL via OROMUCOSAL
  Filled 2015-06-05: qty 15

## 2015-06-05 MED ORDER — FOSFOMYCIN TROMETHAMINE 3 G PO PACK
3.0000 g | PACK | Freq: Once | ORAL | Status: AC
  Administered 2015-06-05: 3 g via ORAL
  Filled 2015-06-05: qty 3

## 2015-06-05 MED ORDER — ALUM & MAG HYDROXIDE-SIMETH 200-200-20 MG/5ML PO SUSP
15.0000 mL | Freq: Once | ORAL | Status: AC
  Administered 2015-06-05: 15 mL via ORAL
  Filled 2015-06-05: qty 30

## 2015-06-05 MED ORDER — HYDROCODONE-ACETAMINOPHEN 10-325 MG PO TABS: 1.0000 | ORAL_TABLET | Freq: Once | ORAL | Status: DC

## 2015-06-05 NOTE — ED Provider Notes (Signed)
CSN: [id number]     Arrival date & time 06/05/15  9417 History   First MD Initiated Contact with Patient 06/05/15 [phone]     Chief Complaint  Patient presents with  . Back Pain  . Abdominal Pain     (Consider location/radiation/quality/duration/timing/severity/associated sxs/prior Treatment) HPI  Terry Potter is a 59 year old woman with a past medical history of schizoaffective disorder - bipolar type and arthritis who presents with a month long history of abdominal pain and back pain. She describes her pain as starting in the center of her abdomen, is constant, and does not change with meals. She had one episode of yellow emesis this morning. She reports an overall decreased appetite but was requesting some food. She also describes month long lower back pain, right side worse than left. The pain is worsened by palpation or if she bends at the waist. She describes her back pain as 8/10. She also reports that over the past few days she's had some burning on urination and increased urinary frequency. Moreover, she follows with a pain clinic for her back pain. She continues to smoke cigarillos and crack, last smoking crack two days ago.  Past Medical History  Diagnosis Date  . Ectopic pregnancy   . Arthritis    Past Surgical History  Procedure Laterality Date  . Abdominal surgery    . Tumor removal      Uterine tumor removed   No family history on file. Social History  Substance Use Topics  . Smoking status: Current Every Day Smoker -- 0.25 packs/day    Types: Cigarettes, Cigars  . Smokeless tobacco: None  . Alcohol Use: Yes     Comment: occ   OB History    No data available     Review of Systems  Constitutional: Positive for appetite change and fatigue. Negative for fever, chills, diaphoresis and activity change.  HENT: Positive for congestion. Negative for sinus pressure and sore throat.   Eyes: Negative for visual disturbance.  Respiratory: Positive for cough. Negative for  shortness of breath and wheezing.   Cardiovascular: Negative for chest pain, palpitations and leg swelling.  Gastrointestinal: Positive for vomiting, abdominal pain and diarrhea.       Occasional loose stools.  Genitourinary: Positive for dysuria and urgency.  Musculoskeletal: Positive for back pain and arthralgias.  Skin: Negative for rash.  Neurological: Positive for headaches. Negative for dizziness and light-headedness.  Psychiatric/Behavioral: Positive for dysphoric mood.       Reports occasional SI and HI, but no plans. She reports he symptoms are well controlled.      Allergies  Tetracyclines & related  Home Medications   Prior to Admission medications   Medication Sig Start Date End Date Taking? Authorizing Provider  aspirin 325 MG tablet Take 325 mg by mouth daily.    Historical Provider, MD  Aspirin-Salicylamide-Caffeine (BC HEADACHE POWDER PO) Take 1 packet by mouth daily as needed (headache).    Historical Provider, MD  gabapentin (NEURONTIN) 300 MG capsule Take 300 mg by mouth 3 (three) times daily.    Historical Provider, MD  HYDROcodone-acetaminophen (NORCO) 10-325 MG per tablet Take 2 tablets by mouth every 6 (six) hours as needed.    Historical Provider, MD  ondansetron (ZOFRAN) 8 MG tablet Take 1 tablet (8 mg total) by mouth every 8 (eight) hours as needed for nausea or vomiting. Patient not taking: Reported on 03/18/2015 12/16/14   Mercedes Camprubi-Soms, PA-C  Oxcarbazepine (TRILEPTAL) 300 MG tablet Take 300 mg  by mouth 3 (three) times daily.    Historical Provider, MD  PROAIR HFA 108 (90 BASE) MCG/ACT inhaler Inhale 1-2 puffs into the lungs every 4 (four) hours as needed for wheezing or shortness of breath.  04/03/14   Historical Provider, MD  risperiDONE (RISPERDAL) 1 MG tablet Take 1 mg by mouth 2 (two) times daily.    Historical Provider, MD  sulfamethoxazole-trimethoprim (BACTRIM DS,SEPTRA DS) 800-160 MG per tablet Take 1 tablet by mouth 2 (two) times daily. X 5  days Patient not taking: Reported on 03/18/2015 12/16/14   Mercedes Camprubi-Soms, PA-C   BP 105/59 mmHg  Pulse 70  Temp(Src) 98.3 F (36.8 C) (Oral)  Resp 18  Ht 5\' 4"  (1.626 m)  Wt 70.761 kg  BMI 26.76 kg/m2  SpO2 98% Physical Exam  Constitutional: She is oriented to person, place, and time. She appears well-developed and well-nourished. No distress.  HENT:  Mouth/Throat: Oropharynx is clear and moist. No oropharyngeal exudate.  Poor dentition  Eyes: EOM are normal. Pupils are equal, round, and reactive to light. No scleral icterus.  Neck: Neck supple. No thyromegaly present.  Cardiovascular: Normal rate, regular rhythm and normal heart sounds.   Pulmonary/Chest: Effort normal and breath sounds normal. No respiratory distress. She has no wheezes.  Abdominal: Soft. Bowel sounds are normal.  Tenderness to palpation of mid-abdomen without rebound or regarding. Mild discomfort in the quadrants.  Genitourinary: Cervix exhibits discharge. Cervix exhibits no motion tenderness. No erythema, tenderness or bleeding in the vagina. No foreign body around the vagina. Vaginal discharge found.  Musculoskeletal: She exhibits no edema.  Flank tenderness in lower back, right greater than left. No bony tenderness.  Lymphadenopathy:    She has no cervical adenopathy.  Neurological: She is alert and oriented to person, place, and time. She has normal reflexes.  Skin: Skin is warm and dry.  Psychiatric: She has a normal mood and affect.  Increased startle response  Vitals reviewed.   ED Course  Procedures (including critical care time) Labs Review Labs Reviewed  URINALYSIS, ROUTINE W REFLEX MICROSCOPIC (NOT AT [facility])  LIPASE, BLOOD  COMPREHENSIVE METABOLIC PANEL  CBC WITH DIFFERENTIAL/PLATELET  POC URINE PREG, ED    Imaging Review No results found. I have personally reviewed and evaluated these images and lab results as part of my medical decision-making.   EKG  Interpretation   Date/Time:  Wednesday June 05 2015 09:17:41 EST Ventricular Rate:  68 PR Interval:  166 QRS Duration: 84 QT Interval:  378 QTC Calculation: 402 R Axis:   58 Text Interpretation:  Sinus rhythm No significant change since last  tracing Confirmed by FLOYD MD, 12-15-1981 Reuel Boom) on 06/05/2015 9:34:43 AM      MDM   Final diagnoses:  None    The differential includes UTI with possible pyelonephritis, although her symptoms have lasted almost a month and she is afebrile. Pancreatitis and cholelithiasis are additional considerations. Although unlikely, a pregnancy test was obtained which was negative. A UA, CMP, CBC, and lipase are pending.  UA showed small leukocytes and microscopy demonstrates high white count but only a few bacteria, suggesting a possible STI or BV. Pelvic exam was performed with a chaperone and wet prep and GC/Chlamydia obtained. No salient findings on pelvic exam, no cervical motion tenderness. Wet prep unremarkable and GC/Chlamydia were pending.  She was given a dose of fosfomycin and lidocaine patches for her back pain. She was discharged in stable condition.   06/07/2015, MD 06/05/15 1506  06/07/15,  DO 06/05/15 1611

## 2015-06-05 NOTE — ED Notes (Signed)
C/o back and abd pain X 1 month with intermitent V/D, no CP,c/o SOB, A/O and in NAD

## 2015-06-05 NOTE — ED Notes (Signed)
Gave pt.something to eat and something to drink.per doctor order .

## 2015-06-05 NOTE — Discharge Instructions (Signed)

## 2015-06-06 LAB — GC/CHLAMYDIA PROBE AMP (~~LOC~~) NOT AT ARMC
CHLAMYDIA, DNA PROBE: POSITIVE — AB
NEISSERIA GONORRHEA: NEGATIVE

## 2015-06-07 LAB — URINE CULTURE: Culture: >=100000

## 2015-06-08 ENCOUNTER — Telehealth (HOSPITAL_COMMUNITY): Payer: Self-pay

## 2015-06-08 NOTE — Telephone Encounter (Signed)
Post ED Visit - Positive Culture Follow-up  Culture report reviewed by antimicrobial stewardship pharmacist:  []  , Pharm.D. []  Enzo Bi, Pharm.D., BCPS []  , Pharm.D. []  Celedonio Miyamoto, Pharm.D., BCPS []  [place], Garvin Fila.D., BCPS, AAHIVP []  , Pharm.D., BCPS, AAHIVP []  Georgina Pillion, Pharm.D. [x]   . Pharm D [place], [address].D.  Positive urine culture Treated with fosfomycin organism sensitive to the same and no further patient follow-up is required at this time.  06/08/2015, 12:48 PM

## 2016-01-06 ENCOUNTER — Encounter (HOSPITAL_COMMUNITY): Payer: Self-pay | Admitting: *Deleted

## 2016-01-06 ENCOUNTER — Ambulatory Visit (HOSPITAL_COMMUNITY)
Admission: EM | Admit: 2016-01-06 | Discharge: 2016-01-06 | Disposition: A | Discharge status: Home / Self Care | Payer: Medicare Other | Attending: Family Medicine | Admitting: Family Medicine

## 2016-01-06 DIAGNOSIS — M940 Chondrocostal junction syndrome [Tietze]: Secondary | ICD-10-CM

## 2016-01-06 DIAGNOSIS — F17200 Nicotine dependence, unspecified, uncomplicated: Secondary | ICD-10-CM

## 2016-01-06 DIAGNOSIS — Z720 Tobacco use: Secondary | ICD-10-CM

## 2016-01-06 DIAGNOSIS — R059 Cough, unspecified: Secondary | ICD-10-CM

## 2016-01-06 DIAGNOSIS — R05 Cough: Secondary | ICD-10-CM

## 2016-01-06 MED ORDER — BENZONATATE 100 MG PO CAPS: 200.0000 mg | ORAL_CAPSULE | Freq: Three times a day (TID) | ORAL | 0 refills | Status: DC | PRN

## 2016-01-06 MED ORDER — METHYLPREDNISOLONE 4 MG PO TBPK: ORAL_TABLET | ORAL | 0 refills | Status: DC

## 2016-01-06 MED ORDER — KETOROLAC TROMETHAMINE 60 MG/2ML IM SOLN
INTRAMUSCULAR | Status: AC
  Filled 2016-01-06: qty 2

## 2016-01-06 MED ORDER — KETOROLAC TROMETHAMINE 60 MG/2ML IM SOLN
60.0000 mg | Freq: Once | INTRAMUSCULAR | Status: AC
  Administered 2016-01-06: 60 mg via INTRAMUSCULAR

## 2016-01-06 NOTE — ED Triage Notes (Signed)
Pt skin  Is  Warm  And  Dry   She  Is  Alert  And  orinted   Appears  In no  Acute  Distress

## 2016-01-06 NOTE — ED Provider Notes (Signed)
CSN: [id number]     Arrival date & time 01/06/16  1222 History   None    Chief Complaint  Patient presents with  . Chest Pain   (Consider location/radiation/quality/duration/timing/severity/associated sxs/prior Treatment) Patient has developed chest pain and this happens after she coughs.  Patient is a smoker and has developed a cough and she now has chest discomfort.    Chest Pain    Past Medical History:  Diagnosis Date  . Arthritis   . Ectopic pregnancy    Past Surgical History:  Procedure Laterality Date  . ABDOMINAL SURGERY    . TUMOR REMOVAL     Uterine tumor removed   No family history on file. Social History  Substance Use Topics  . Smoking status: Current Every Day Smoker    Packs/day: 0.25    Types: Cigarettes, Cigars  . Smokeless tobacco: Not on file  . Alcohol use Yes     Comment: occ   OB History    No data available     Review of Systems  Constitutional: Negative.   HENT: Negative.   Eyes: Negative.   Respiratory: Negative.   Cardiovascular: Positive for chest pain.  Gastrointestinal: Negative.   Endocrine: Negative.   Genitourinary: Negative.   Musculoskeletal: Negative.   Skin: Negative.   Allergic/Immunologic: Negative.   Neurological: Negative.   Hematological: Negative.   Psychiatric/Behavioral: Negative.     Allergies  Tetracyclines & related  Home Medications   Prior to Admission medications   Medication Sig Start Date End Date Taking? Authorizing Provider  aspirin 325 MG tablet Take 325 mg by mouth daily.    Historical Provider, MD  gabapentin (NEURONTIN) 300 MG capsule Take 300 mg by mouth 3 (three) times daily.    Historical Provider, MD  HYDROcodone-acetaminophen (NORCO) 10-325 MG per tablet Take 2 tablets by mouth every 6 (six) hours as needed.    Historical Provider, MD  ondansetron (ZOFRAN) 8 MG tablet Take 1 tablet (8 mg total) by mouth every 8 (eight) hours as needed for nausea or vomiting. Patient not taking: Reported  on 03/18/2015 12/16/14   Mercedes Camprubi-Soms, PA-C  Oxcarbazepine (TRILEPTAL) 300 MG tablet Take 300 mg by mouth 3 (three) times daily.    Historical Provider, MD  PROAIR HFA 108 (90 BASE) MCG/ACT inhaler Inhale 1-2 puffs into the lungs every 4 (four) hours as needed for wheezing or shortness of breath.  04/03/14   Historical Provider, MD  risperiDONE (RISPERDAL) 1 MG tablet Take 1 mg by mouth 2 (two) times daily.    Historical Provider, MD  sulfamethoxazole-trimethoprim (BACTRIM DS,SEPTRA DS) 800-160 MG per tablet Take 1 tablet by mouth 2 (two) times daily. X 5 days Patient not taking: Reported on 03/18/2015 12/16/14   Mercedes Camprubi-Soms, PA-C   Meds Ordered and Administered this Visit  Medications - No data to display  There were no vitals taken for this visit. No data found.   Physical Exam  Constitutional: She is oriented to person, place, and time. She appears well-developed and well-nourished.  HENT:  Head: Normocephalic and atraumatic.  Right Ear: External ear normal.  Left Ear: External ear normal.  Mouth/Throat: Oropharynx is clear and moist.  Eyes: EOM are normal. Pupils are equal, round, and reactive to light.  Neck: Normal range of motion. Neck supple.  Cardiovascular: Normal rate, regular rhythm and normal heart sounds.   Pulmonary/Chest: Effort normal and breath sounds normal. She exhibits tenderness.  Neurological: She is alert and oriented to person, place, and time.  Nursing note and vitals reviewed.   Urgent Care Course   Clinical Course    Procedures (including critical care time)  Labs Review Labs Reviewed - No data to display  Imaging Review No results found.   Visual Acuity Review  Right Eye Distance:   Left Eye Distance:   Bilateral Distance:    Right Eye Near:   Left Eye Near:    Bilateral Near:     EKG - NSR without acute ST-T changes.    MDM  Chest wall pain - Medrol dose pack as directed #21 4mg  Toradol 60mg  IM.  Explained to  patient that this pain is MS in origin and will be relieved with medrol dose pack an toradol.    Cough - Tessalon perles 200mg  one po tid prn #21  Smoking - Advised patient to quit smoking.    , FNP 01/06/16 1256

## 2016-05-28 ENCOUNTER — Emergency Department (HOSPITAL_COMMUNITY)
Admission: EM | Admit: 2016-05-28 | Discharge: 2016-05-28 | Disposition: A | Discharge status: Home / Self Care | Payer: Medicare Other | Attending: Emergency Medicine | Admitting: Emergency Medicine

## 2016-05-28 ENCOUNTER — Encounter (HOSPITAL_COMMUNITY): Payer: Self-pay | Admitting: Emergency Medicine

## 2016-05-28 DIAGNOSIS — Z7982 Long term (current) use of aspirin: Secondary | ICD-10-CM | POA: Insufficient documentation

## 2016-05-28 DIAGNOSIS — N3001 Acute cystitis with hematuria: Secondary | ICD-10-CM | POA: Diagnosis not present

## 2016-05-28 DIAGNOSIS — R319 Hematuria, unspecified: Secondary | ICD-10-CM | POA: Diagnosis present

## 2016-05-28 DIAGNOSIS — N760 Acute vaginitis: Secondary | ICD-10-CM | POA: Diagnosis not present

## 2016-05-28 DIAGNOSIS — B9689 Other specified bacterial agents as the cause of diseases classified elsewhere: Secondary | ICD-10-CM | POA: Diagnosis not present

## 2016-05-28 DIAGNOSIS — F17210 Nicotine dependence, cigarettes, uncomplicated: Secondary | ICD-10-CM | POA: Insufficient documentation

## 2016-05-28 HISTORY — DX: Bipolar disorder, unspecified: F31.9

## 2016-05-28 LAB — URINALYSIS, ROUTINE W REFLEX MICROSCOPIC
BACTERIA UA: NONE SEEN
BILIRUBIN URINE: NEGATIVE
Glucose, UA: NEGATIVE mg/dL
Ketones, ur: NEGATIVE mg/dL
NITRITE: NEGATIVE
PROTEIN: 100 mg/dL — AB
Specific Gravity, Urine: 1.019 (ref 1.005–1.030)
pH: 6 (ref 5.0–8.0)

## 2016-05-28 LAB — CBC WITH DIFFERENTIAL/PLATELET
BASOS ABS: 0 10*3/uL (ref 0.0–0.1)
Basophils Relative: 1 %
EOS ABS: 0.3 10*3/uL (ref 0.0–0.7)
EOS PCT: 5 %
HCT: 36.1 % (ref 36.0–46.0)
Hemoglobin: 12.1 g/dL (ref 12.0–15.0)
LYMPHS PCT: 30 %
Lymphs Abs: 1.8 10*3/uL (ref 0.7–4.0)
MCH: 30.8 pg (ref 26.0–34.0)
MCHC: 33.5 g/dL (ref 30.0–36.0)
MCV: 91.9 fL (ref 78.0–100.0)
Monocytes Absolute: 0.4 10*3/uL (ref 0.1–1.0)
Monocytes Relative: 7 %
Neutro Abs: 3.4 10*3/uL (ref 1.7–7.7)
Neutrophils Relative %: 57 %
PLATELETS: 154 10*3/uL (ref 150–400)
RBC: 3.93 MIL/uL (ref 3.87–5.11)
RDW: 16.4 % — ABNORMAL HIGH (ref 11.5–15.5)
WBC: 6 10*3/uL (ref 4.0–10.5)

## 2016-05-28 LAB — BASIC METABOLIC PANEL
Anion gap: 7 (ref 5–15)
BUN: 17 mg/dL (ref 6–20)
CO2: 27 mmol/L (ref 22–32)
Calcium: 8.7 mg/dL — ABNORMAL LOW (ref 8.9–10.3)
Chloride: 107 mmol/L (ref 101–111)
Creatinine, Ser: 0.86 mg/dL (ref 0.44–1.00)
Glucose, Bld: 94 mg/dL (ref 65–99)
POTASSIUM: 3.9 mmol/L (ref 3.5–5.1)
SODIUM: 141 mmol/L (ref 135–145)

## 2016-05-28 LAB — WET PREP, GENITAL
Sperm: NONE SEEN
TRICH WET PREP: NONE SEEN
YEAST WET PREP: NONE SEEN

## 2016-05-28 MED ORDER — PHENAZOPYRIDINE HCL 100 MG PO TABS
200.0000 mg | ORAL_TABLET | Freq: Once | ORAL | Status: AC
  Administered 2016-05-28: 200 mg via ORAL
  Filled 2016-05-28: qty 2

## 2016-05-28 MED ORDER — LIDOCAINE 5 % EX PTCH
1.0000 | MEDICATED_PATCH | Freq: Once | CUTANEOUS | Status: DC
  Administered 2016-05-28: 1 via TRANSDERMAL
  Filled 2016-05-28: qty 1

## 2016-05-28 MED ORDER — CEPHALEXIN 500 MG PO CAPS: 500.0000 mg | ORAL_CAPSULE | Freq: Two times a day (BID) | ORAL | 0 refills | Status: DC

## 2016-05-28 MED ORDER — CEFTRIAXONE SODIUM 1 G IJ SOLR
1.0000 g | Freq: Once | INTRAMUSCULAR | Status: AC
  Administered 2016-05-28: 1 g via INTRAMUSCULAR
  Filled 2016-05-28: qty 10

## 2016-05-28 MED ORDER — LIDOCAINE HCL (PF) 1 % IJ SOLN
5.0000 mL | Freq: Once | INTRAMUSCULAR | Status: AC
  Administered 2016-05-28: 2 mL via INTRADERMAL
  Filled 2016-05-28: qty 5

## 2016-05-28 MED ORDER — METRONIDAZOLE 500 MG PO TABS
500.0000 mg | ORAL_TABLET | Freq: Once | ORAL | Status: AC
  Administered 2016-05-28: 500 mg via ORAL
  Filled 2016-05-28: qty 1

## 2016-05-28 MED ORDER — METRONIDAZOLE 500 MG PO TABS: 500.0000 mg | ORAL_TABLET | Freq: Two times a day (BID) | ORAL | 0 refills | Status: DC

## 2016-05-28 NOTE — ED Provider Notes (Signed)
[facility]-EMERGENCY DEPT Provider Note   CSN: [id number] Arrival date & time: 05/28/16  1015     History   Chief Complaint Chief Complaint  Patient presents with  . Back Pain  . Hematuria   HPI   Blood pressure 121/77, pulse 68, temperature 98.3 F (36.8 C), temperature source Oral, resp. rate 18, SpO2 100 %.  Terry Potter is a 60 y.o. female complaining of Dysuria and hematuria worsening over the course of last day with associated bilateral low back pain which she rates as severe, no exacerbating or alleviating factors identified. No pain medication taken prior to arrival. She denies Nausea, vomiting, fever, chills. She endorses abnormal vaginal discharge onset 2 days ago with the association of the dysuria and hematuria.    Past Medical History:  Diagnosis Date  . Arthritis   . Bipolar disorder (HCC)   . Ectopic pregnancy     Patient Active Problem List   Diagnosis Date Noted  . Schizoaffective disorder, bipolar type (HCC) 03/19/2015    Class: Chronic    Past Surgical History:  Procedure Laterality Date  . ABDOMINAL SURGERY    . TUMOR REMOVAL     Uterine tumor removed    OB History    No data available       Home Medications    Prior to Admission medications   Medication Sig Start Date End Date Taking? Authorizing Provider  aspirin 325 MG tablet Take 325 mg by mouth daily.    Historical Provider, MD  benzonatate (TESSALON) 100 MG capsule Take 2 capsules (200 mg total) by mouth 3 (three) times daily as needed for cough. 01/06/16   Deatra Canter, FNP  cephALEXin (KEFLEX) 500 MG capsule Take 1 capsule (500 mg total) by mouth 2 (two) times daily. 05/28/16   Shayla Heming, PA-C  gabapentin (NEURONTIN) 300 MG capsule Take 300 mg by mouth 3 (three) times daily.    Historical Provider, MD  HYDROcodone-acetaminophen (NORCO) 10-325 MG per tablet Take 2 tablets by mouth every 6 (six) hours as needed.    Historical Provider, MD  methylPREDNISolone (MEDROL  DOSEPAK) 4 MG TBPK tablet Take 6-5-4-3-2-1 po qd 01/06/16   Deatra Canter, FNP  metroNIDAZOLE (FLAGYL) 500 MG tablet Take 1 tablet (500 mg total) by mouth 2 (two) times daily. One po bid x 7 days 05/28/16   Joni Reining Aleesha Ringstad, PA-C  ondansetron (ZOFRAN) 8 MG tablet Take 1 tablet (8 mg total) by mouth every 8 (eight) hours as needed for nausea or vomiting. Patient not taking: Reported on 03/18/2015 12/16/14   Mercedes Camprubi-Soms, PA-C  Oxcarbazepine (TRILEPTAL) 300 MG tablet Take 300 mg by mouth 3 (three) times daily.    Historical Provider, MD  PROAIR HFA 108 (90 BASE) MCG/ACT inhaler Inhale 1-2 puffs into the lungs every 4 (four) hours as needed for wheezing or shortness of breath.  04/03/14   Historical Provider, MD  risperiDONE (RISPERDAL) 1 MG tablet Take 1 mg by mouth 2 (two) times daily.    Historical Provider, MD  sulfamethoxazole-trimethoprim (BACTRIM DS,SEPTRA DS) 800-160 MG per tablet Take 1 tablet by mouth 2 (two) times daily. X 5 days Patient not taking: Reported on 03/18/2015 12/16/14   Mercedes Camprubi-Soms, PA-C    Family History History reviewed. No pertinent family history.  Social History Social History  Substance Use Topics  . Smoking status: Current Every Day Smoker    Packs/day: 0.25    Types: Cigarettes, Cigars  . Smokeless tobacco: Never Used  . Alcohol use  Yes     Comment: occ     Allergies   Tetracyclines & related   Review of Systems Review of Systems  10 systems reviewed and found to be negative, except as noted in the HPI.  Physical Exam Updated Vital Signs BP 107/64 (BP Location: Right Arm)   Pulse (!) 58   Temp 98.7 F (37.1 C) (Oral)   Resp 18   SpO2 100%   Physical Exam  Constitutional: She is oriented to person, place, and time. She appears well-developed and well-nourished. No distress.  HENT:  Head: Normocephalic and atraumatic.  Mouth/Throat: Oropharynx is clear and moist.  Eyes: Conjunctivae and EOM are normal. Pupils are equal,  round, and reactive to light.  Neck: Normal range of motion.  Cardiovascular: Normal rate, regular rhythm and intact distal pulses.   Pulmonary/Chest: Effort normal and breath sounds normal.  Abdominal: Soft. She exhibits no distension and no mass. There is no tenderness. There is no rebound and no guarding. No hernia.  Genitourinary:  Genitourinary Comments: No focal CVA tenderness to palpation, exquisitely tender to light palpation diffusely along the low back.  Pelvic exam is chaperoned by technician: No rashes or lesions, no cervical motion or adnexal tenderness.  Musculoskeletal: Normal range of motion.  Neurological: She is alert and oriented to person, place, and time.  Skin: She is not diaphoretic.  Psychiatric: She has a normal mood and affect.  Nursing note and vitals reviewed.    ED Treatments / Results  Labs (all labs ordered are listed, but only abnormal results are displayed) Labs Reviewed  WET PREP, GENITAL - Abnormal; Notable for the following:       Result Value   Clue Cells Wet Prep HPF POC PRESENT (*)    WBC, Wet Prep HPF POC RARE (*)    All other components within normal limits  URINALYSIS, ROUTINE W REFLEX MICROSCOPIC - Abnormal; Notable for the following:    APPearance CLOUDY (*)    Hgb urine dipstick LARGE (*)    Protein, ur 100 (*)    Leukocytes, UA LARGE (*)    Squamous Epithelial / LPF 0-5 (*)    All other components within normal limits  CBC WITH DIFFERENTIAL/PLATELET - Abnormal; Notable for the following:    RDW 16.4 (*)    All other components within normal limits  BASIC METABOLIC PANEL - Abnormal; Notable for the following:    Calcium 8.7 (*)    All other components within normal limits  URINE CULTURE  GC/CHLAMYDIA PROBE AMP ([place]) NOT AT [facility]    EKG  EKG Interpretation None       Radiology No results found.  Procedures Procedures (including critical care time)  Medications Ordered in ED Medications  lidocaine (LIDODERM) 5  % 1 patch (1 patch Transdermal Patch Applied 05/28/16 1119)  metroNIDAZOLE (FLAGYL) tablet 500 mg (not administered)  cefTRIAXone (ROCEPHIN) injection 1 g (1 g Intramuscular Given 05/28/16 1247)  phenazopyridine (PYRIDIUM) tablet 200 mg (200 mg Oral Given 05/28/16 1245)  lidocaine (PF) (XYLOCAINE) 1 % injection 5 mL (2 mLs Intradermal Given 05/28/16 1247)     Initial Impression / Assessment and Plan / ED Course  I have reviewed the triage vital signs and the nursing notes.  Pertinent labs & imaging results that were available during my care of the patient were reviewed by me and considered in my medical decision making (see chart for details).  Clinical Course    Vitals:   05/28/16 1300 05/28/16 1315 05/28/16  1330 05/28/16 1353  BP: 94/59 (!) 104/54 103/72 107/64  Pulse: 64 (!) 56 (!) 56 (!) 58  Resp:    18  Temp:    98.7 F (37.1 C)  TempSrc:    Oral  SpO2: 99% 99% 99% 100%    Medications  lidocaine (LIDODERM) 5 % 1 patch (1 patch Transdermal Patch Applied 05/28/16 1119)  metroNIDAZOLE (FLAGYL) tablet 500 mg (not administered)  cefTRIAXone (ROCEPHIN) injection 1 g (1 g Intramuscular Given 05/28/16 1247)  phenazopyridine (PYRIDIUM) tablet 200 mg (200 mg Oral Given 05/28/16 1245)  lidocaine (PF) (XYLOCAINE) 1 % injection 5 mL (2 mLs Intradermal Given 05/28/16 1247)    ZENIYAH PEASTER is 60 y.o. female presenting with Serious, hematuria, lower back pain and vaginal discharge. Urinalysis is consistent with infection, culture pending, patient given Rocephin. No focal CVA tenderness to percussion, I doubt this is a pyelonephritis. Patient overall nontoxic appearing. Blood work reassuring. Pelvic exam with no significant abnormality however wet prep with clue cells, she is reporting vaginal discharge therefore patient will be treated with Flagyl for bacterial vaginosis.  Evaluation does not show pathology that would require ongoing emergent intervention or inpatient treatment. Pt is  hemodynamically stable and mentating appropriately. Discussed findings and plan with patient/guardian, who agrees with care plan. All questions answered. Return precautions discussed and outpatient follow up given.   Final Clinical Impressions(s) / ED Diagnoses   Final diagnoses:  Acute cystitis with hematuria  Bacterial vaginosis    New Prescriptions New Prescriptions   CEPHALEXIN (KEFLEX) 500 MG CAPSULE    Take 1 capsule (500 mg total) by mouth 2 (two) times daily.   METRONIDAZOLE (FLAGYL) 500 MG TABLET    Take 1 tablet (500 mg total) by mouth 2 (two) times daily. One po bid x 7 days     Wynetta Emery, PA-C 05/28/16 1415    [address], PA-C 05/28/16 1415    Lyndal Pulley, MD 05/29/16 [phone]

## 2016-05-28 NOTE — ED Triage Notes (Signed)
Pt here for blood in her urine and lower back pain

## 2016-05-28 NOTE — ED Notes (Signed)
Pt reports, "Sweating, I don't have anyway of taking my temperature." Reports fevers.

## 2016-05-28 NOTE — Discharge Planning (Signed)
Pt up for discharge. EDCM reviewed chart for possible CM needs.  No needs identified or communicated.  

## 2016-05-28 NOTE — ED Notes (Signed)
Pelvic Cart at the bedside  

## 2016-05-28 NOTE — ED Notes (Signed)
Gave pt. happymeal with soda

## 2016-05-28 NOTE — Discharge Instructions (Signed)
°  Take your antibiotics as directed and to completion. You should never have any leftover antibiotics! Push fluids and stay well hydrated.  ° °Any antibiotic use can reduce the efficacy of hormonal birth control. Please use back up method of contraception.  ° °Do not drink alcohol while you are taking flagyl (metronidazole) because it will make you very sick. ° °Please follow with your primary care doctor in the next 2 days for a check-up. They must obtain records for further management.  ° °Do not hesitate to return to the Emergency Department for any new, worsening or concerning symptoms.  ° °

## 2016-05-28 NOTE — ED Notes (Signed)
Lab contacted regarding wet prep. It has been received at 1334, will be in process.

## 2016-05-29 LAB — GC/CHLAMYDIA PROBE AMP (~~LOC~~) NOT AT ARMC
Chlamydia: POSITIVE — AB
Neisseria Gonorrhea: NEGATIVE

## 2016-05-30 LAB — URINE CULTURE

## 2016-05-31 ENCOUNTER — Telehealth: Payer: Self-pay

## 2016-05-31 NOTE — Telephone Encounter (Signed)
Post ED Visit - Positive Culture Follow-up  Culture report reviewed by antimicrobial stewardship pharmacist:  []  , Pharm.D. []  Enzo Bi, .D., BCPS []  Celedonio Miyamoto, Pharm.D. []  [address], Pharm.D., BCPS []  [place], Garvin Fila.D., BCPS, AAHIVP []  , Pharm.D., BCPS, AAHIVP []  Georgina Pillion, Pharm.D. []  , [place].D. [address] Masters Pharm D Positive urine culture Treated with Cephalexin, organism sensitive to the same and no further patient follow-up is required at this time.  05/31/2016, 1:59 PM

## 2016-06-01 ENCOUNTER — Encounter (HOSPITAL_COMMUNITY): Payer: Self-pay | Admitting: Emergency Medicine

## 2016-06-22 ENCOUNTER — Emergency Department (HOSPITAL_COMMUNITY)
Admission: EM | Admit: 2016-06-22 | Discharge: 2016-06-22 | Discharge status: Against Medical Advice | Payer: Medicare Other

## 2016-06-22 NOTE — ED Notes (Signed)
Pt insisted she leave to see PCP. Pt couldn't get an appointment with her PCP but was able to get ain touch with them and schedule an appointment for later today.

## 2017-01-01 ENCOUNTER — Emergency Department (HOSPITAL_COMMUNITY): Payer: Medicare Other

## 2017-01-01 ENCOUNTER — Encounter (HOSPITAL_COMMUNITY): Payer: Self-pay | Admitting: Nurse Practitioner

## 2017-01-01 ENCOUNTER — Inpatient Hospital Stay (HOSPITAL_COMMUNITY)
Admission: EM | Admit: 2017-01-01 | Discharge: 2017-01-04 | DRG: 470 | Disposition: A | Discharge status: Skilled Nursing Facility | Payer: Medicare Other | Attending: Family Medicine | Admitting: Family Medicine

## 2017-01-01 ENCOUNTER — Other Ambulatory Visit: Payer: Self-pay

## 2017-01-01 ENCOUNTER — Inpatient Hospital Stay (HOSPITAL_COMMUNITY): Payer: Medicare Other | Admitting: Certified Registered"

## 2017-01-01 ENCOUNTER — Encounter (HOSPITAL_COMMUNITY)
Admission: EM | Disposition: A | Discharge status: Skilled Nursing Facility | Payer: Self-pay | Source: Home / Self Care | Attending: Family Medicine

## 2017-01-01 ENCOUNTER — Inpatient Hospital Stay (HOSPITAL_COMMUNITY): Payer: Medicare Other

## 2017-01-01 DIAGNOSIS — S72032A Displaced midcervical fracture of left femur, initial encounter for closed fracture: Principal | ICD-10-CM | POA: Diagnosis present

## 2017-01-01 DIAGNOSIS — Z7982 Long term (current) use of aspirin: Secondary | ICD-10-CM

## 2017-01-01 DIAGNOSIS — F250 Schizoaffective disorder, bipolar type: Secondary | ICD-10-CM | POA: Diagnosis present

## 2017-01-01 DIAGNOSIS — S72009A Fracture of unspecified part of neck of unspecified femur, initial encounter for closed fracture: Secondary | ICD-10-CM | POA: Diagnosis not present

## 2017-01-01 DIAGNOSIS — Z7951 Long term (current) use of inhaled steroids: Secondary | ICD-10-CM | POA: Diagnosis not present

## 2017-01-01 DIAGNOSIS — Y92410 Unspecified street and highway as the place of occurrence of the external cause: Secondary | ICD-10-CM | POA: Diagnosis not present

## 2017-01-01 DIAGNOSIS — Y9302 Activity, running: Secondary | ICD-10-CM | POA: Diagnosis present

## 2017-01-01 DIAGNOSIS — E876 Hypokalemia: Secondary | ICD-10-CM | POA: Diagnosis not present

## 2017-01-01 DIAGNOSIS — G629 Polyneuropathy, unspecified: Secondary | ICD-10-CM | POA: Diagnosis not present

## 2017-01-01 DIAGNOSIS — F1410 Cocaine abuse, uncomplicated: Secondary | ICD-10-CM | POA: Diagnosis present

## 2017-01-01 DIAGNOSIS — I959 Hypotension, unspecified: Secondary | ICD-10-CM | POA: Diagnosis not present

## 2017-01-01 DIAGNOSIS — W010XXA Fall on same level from slipping, tripping and stumbling without subsequent striking against object, initial encounter: Secondary | ICD-10-CM | POA: Diagnosis present

## 2017-01-01 DIAGNOSIS — S72002A Fracture of unspecified part of neck of left femur, initial encounter for closed fracture: Secondary | ICD-10-CM | POA: Diagnosis present

## 2017-01-01 DIAGNOSIS — D6489 Other specified anemias: Secondary | ICD-10-CM | POA: Diagnosis present

## 2017-01-01 DIAGNOSIS — Z23 Encounter for immunization: Secondary | ICD-10-CM

## 2017-01-01 DIAGNOSIS — W540XXA Bitten by dog, initial encounter: Secondary | ICD-10-CM

## 2017-01-01 DIAGNOSIS — M25552 Pain in left hip: Secondary | ICD-10-CM | POA: Diagnosis present

## 2017-01-01 DIAGNOSIS — Z96642 Presence of left artificial hip joint: Secondary | ICD-10-CM

## 2017-01-01 DIAGNOSIS — Z419 Encounter for procedure for purposes other than remedying health state, unspecified: Secondary | ICD-10-CM

## 2017-01-01 DIAGNOSIS — R55 Syncope and collapse: Secondary | ICD-10-CM

## 2017-01-01 DIAGNOSIS — T148XXA Other injury of unspecified body region, initial encounter: Secondary | ICD-10-CM

## 2017-01-01 DIAGNOSIS — F17210 Nicotine dependence, cigarettes, uncomplicated: Secondary | ICD-10-CM | POA: Diagnosis not present

## 2017-01-01 HISTORY — PX: TOTAL HIP ARTHROPLASTY: SHX124

## 2017-01-01 LAB — CBC WITH DIFFERENTIAL/PLATELET
BASOS ABS: 0 10*3/uL (ref 0.0–0.1)
Basophils Relative: 1 %
EOS ABS: 0.1 10*3/uL (ref 0.0–0.7)
EOS PCT: 1 %
HCT: 38.6 % (ref 36.0–46.0)
Hemoglobin: 13.4 g/dL (ref 12.0–15.0)
LYMPHS ABS: 1.7 10*3/uL (ref 0.7–4.0)
LYMPHS PCT: 22 %
MCH: 31 pg (ref 26.0–34.0)
MCHC: 34.7 g/dL (ref 30.0–36.0)
MCV: 89.4 fL (ref 78.0–100.0)
Monocytes Absolute: 0.6 10*3/uL (ref 0.1–1.0)
Monocytes Relative: 7 %
Neutro Abs: 5.2 10*3/uL (ref 1.7–7.7)
Neutrophils Relative %: 69 %
PLATELETS: 171 10*3/uL (ref 150–400)
RBC: 4.32 MIL/uL (ref 3.87–5.11)
RDW: 14 % (ref 11.5–15.5)
WBC: 7.6 10*3/uL (ref 4.0–10.5)

## 2017-01-01 LAB — URINALYSIS, ROUTINE W REFLEX MICROSCOPIC
Bilirubin Urine: NEGATIVE
GLUCOSE, UA: NEGATIVE mg/dL
HGB URINE DIPSTICK: NEGATIVE
Ketones, ur: NEGATIVE mg/dL
Leukocytes, UA: NEGATIVE
Nitrite: NEGATIVE
PH: 6 (ref 5.0–8.0)
Protein, ur: NEGATIVE mg/dL
SPECIFIC GRAVITY, URINE: 1.01 (ref 1.005–1.030)

## 2017-01-01 LAB — RAPID URINE DRUG SCREEN, HOSP PERFORMED
Amphetamines: NOT DETECTED
BENZODIAZEPINES: NOT DETECTED
Barbiturates: NOT DETECTED
Cocaine: POSITIVE — AB
OPIATES: NOT DETECTED
Tetrahydrocannabinol: NOT DETECTED

## 2017-01-01 LAB — COMPREHENSIVE METABOLIC PANEL
ALT: 20 U/L (ref 14–54)
AST: 28 U/L (ref 15–41)
Albumin: 3.8 g/dL (ref 3.5–5.0)
Alkaline Phosphatase: 79 U/L (ref 38–126)
Anion gap: 10 (ref 5–15)
BUN: 16 mg/dL (ref 6–20)
CHLORIDE: 101 mmol/L (ref 101–111)
CO2: 26 mmol/L (ref 22–32)
Calcium: 9.1 mg/dL (ref 8.9–10.3)
Creatinine, Ser: 0.94 mg/dL (ref 0.44–1.00)
Glucose, Bld: 101 mg/dL — ABNORMAL HIGH (ref 65–99)
POTASSIUM: 3.4 mmol/L — AB (ref 3.5–5.1)
SODIUM: 137 mmol/L (ref 135–145)
Total Bilirubin: 0.4 mg/dL (ref 0.3–1.2)
Total Protein: 7.1 g/dL (ref 6.5–8.1)

## 2017-01-01 LAB — SURGICAL PCR SCREEN
MRSA, PCR: NEGATIVE
Staphylococcus aureus: NEGATIVE

## 2017-01-01 LAB — I-STAT TROPONIN, ED: TROPONIN I, POC: 0 ng/mL (ref 0.00–0.08)

## 2017-01-01 SURGERY — ARTHROPLASTY, HIP, TOTAL, ANTERIOR APPROACH
Anesthesia: General | Site: Hip | Laterality: Left

## 2017-01-01 MED ORDER — HYDROCODONE-ACETAMINOPHEN 5-325 MG PO TABS
1.0000 | ORAL_TABLET | Freq: Four times a day (QID) | ORAL | Status: DC | PRN
  Administered 2017-01-01 – 2017-01-04 (×9): 2 via ORAL
  Filled 2017-01-01 (×10): qty 2

## 2017-01-01 MED ORDER — POTASSIUM CHLORIDE IN NACL 40-0.9 MEQ/L-% IV SOLN
INTRAVENOUS | Status: DC
  Administered 2017-01-01: 75 mL/h via INTRAVENOUS
  Filled 2017-01-01 (×3): qty 1000

## 2017-01-01 MED ORDER — BUPIVACAINE-EPINEPHRINE (PF) 0.5% -1:200000 IJ SOLN
INTRAMUSCULAR | Status: DC | PRN
  Administered 2017-01-01: 30 mL

## 2017-01-01 MED ORDER — FLUTICASONE PROPIONATE 50 MCG/ACT NA SUSP: 1.0000 | Freq: Every day | NASAL | Status: DC | PRN

## 2017-01-01 MED ORDER — TRANEXAMIC ACID 1000 MG/10ML IV SOLN
1000.0000 mg | INTRAVENOUS | Status: AC
  Administered 2017-01-01: 1000 mg via INTRAVENOUS
  Filled 2017-01-01: qty 10

## 2017-01-01 MED ORDER — OXCARBAZEPINE 300 MG PO TABS
300.0000 mg | ORAL_TABLET | Freq: Three times a day (TID) | ORAL | Status: DC
  Administered 2017-01-01 – 2017-01-04 (×8): 300 mg via ORAL
  Filled 2017-01-01 (×10): qty 1

## 2017-01-01 MED ORDER — ASPIRIN EC 325 MG PO TBEC
325.0000 mg | DELAYED_RELEASE_TABLET | Freq: Two times a day (BID) | ORAL | Status: DC
  Administered 2017-01-02 – 2017-01-04 (×5): 325 mg via ORAL
  Filled 2017-01-01 (×5): qty 1

## 2017-01-01 MED ORDER — ASPIRIN 325 MG PO TABS: 325.0000 mg | ORAL_TABLET | Freq: Every day | ORAL | Status: DC

## 2017-01-01 MED ORDER — POVIDONE-IODINE 10 % EX SWAB: 2.0000 "application " | Freq: Once | CUTANEOUS | Status: DC

## 2017-01-01 MED ORDER — GABAPENTIN 300 MG PO CAPS
300.0000 mg | ORAL_CAPSULE | Freq: Three times a day (TID) | ORAL | Status: DC
  Administered 2017-01-01 – 2017-01-04 (×8): 300 mg via ORAL
  Filled 2017-01-01 (×8): qty 1

## 2017-01-01 MED ORDER — PHENYLEPHRINE HCL 10 MG/ML IJ SOLN
INTRAMUSCULAR | Status: DC | PRN
  Administered 2017-01-01 (×2): 80 ug via INTRAVENOUS
  Administered 2017-01-01 (×2): 40 ug via INTRAVENOUS

## 2017-01-01 MED ORDER — DOCUSATE SODIUM 100 MG PO CAPS
100.0000 mg | ORAL_CAPSULE | Freq: Two times a day (BID) | ORAL | Status: DC
  Administered 2017-01-01: 100 mg via ORAL
  Filled 2017-01-01: qty 1

## 2017-01-01 MED ORDER — CEFAZOLIN SODIUM-DEXTROSE 2-4 GM/100ML-% IV SOLN
2.0000 g | INTRAVENOUS | Status: AC
  Administered 2017-01-01: 2 g via INTRAVENOUS
  Filled 2017-01-01: qty 100

## 2017-01-01 MED ORDER — SENNA 8.6 MG PO TABS
1.0000 | ORAL_TABLET | Freq: Two times a day (BID) | ORAL | Status: DC
Start: 2017-01-01 — End: 2017-01-04
  Administered 2017-01-01 – 2017-01-04 (×6): 8.6 mg via ORAL
  Filled 2017-01-01 (×6): qty 1

## 2017-01-01 MED ORDER — AMOXICILLIN-POT CLAVULANATE 875-125 MG PO TABS
1.0000 | ORAL_TABLET | Freq: Two times a day (BID) | ORAL | Status: DC
  Administered 2017-01-02 – 2017-01-04 (×5): 1 via ORAL
  Filled 2017-01-01 (×6): qty 1

## 2017-01-01 MED ORDER — LIDOCAINE HCL (CARDIAC) 20 MG/ML IV SOLN
INTRAVENOUS | Status: DC | PRN
  Administered 2017-01-01: 60 mg via INTRAVENOUS

## 2017-01-01 MED ORDER — DEXAMETHASONE SODIUM PHOSPHATE 10 MG/ML IJ SOLN
INTRAMUSCULAR | Status: DC | PRN
  Administered 2017-01-01: 5 mg via INTRAVENOUS

## 2017-01-01 MED ORDER — SODIUM CHLORIDE 0.9 % IV BOLUS (SEPSIS)
1000.0000 mL | Freq: Once | INTRAVENOUS | Status: AC
  Administered 2017-01-01: 1000 mL via INTRAVENOUS

## 2017-01-01 MED ORDER — ACETAMINOPHEN 325 MG PO TABS: 650.0000 mg | ORAL_TABLET | Freq: Four times a day (QID) | ORAL | Status: DC | PRN

## 2017-01-01 MED ORDER — HYDROMORPHONE HCL 1 MG/ML IJ SOLN
0.2500 mg | INTRAMUSCULAR | Status: DC | PRN
  Administered 2017-01-01: 0.5 mg via INTRAVENOUS

## 2017-01-01 MED ORDER — SUGAMMADEX SODIUM 200 MG/2ML IV SOLN
INTRAVENOUS | Status: DC | PRN
  Administered 2017-01-01: 150 mg via INTRAVENOUS

## 2017-01-01 MED ORDER — KETOROLAC TROMETHAMINE 30 MG/ML IJ SOLN
INTRAMUSCULAR | Status: DC | PRN
  Administered 2017-01-01: 30 mg via INTRAMUSCULAR

## 2017-01-01 MED ORDER — ACETAMINOPHEN 10 MG/ML IV SOLN
1000.0000 mg | INTRAVENOUS | Status: AC
  Administered 2017-01-01: 1000 mg via INTRAVENOUS

## 2017-01-01 MED ORDER — ONDANSETRON HCL 4 MG/2ML IJ SOLN: 4.0000 mg | Freq: Four times a day (QID) | INTRAMUSCULAR | Status: DC | PRN

## 2017-01-01 MED ORDER — KETOROLAC TROMETHAMINE 30 MG/ML IJ SOLN
INTRAMUSCULAR | Status: AC
  Filled 2017-01-01: qty 1

## 2017-01-01 MED ORDER — CHLORHEXIDINE GLUCONATE 4 % EX LIQD: 60.0000 mL | Freq: Once | CUTANEOUS | Status: DC

## 2017-01-01 MED ORDER — FENTANYL CITRATE (PF) 250 MCG/5ML IJ SOLN
INTRAMUSCULAR | Status: AC
  Filled 2017-01-01: qty 5

## 2017-01-01 MED ORDER — PHENOL 1.4 % MT LIQD: 1.0000 | OROMUCOSAL | Status: DC | PRN

## 2017-01-01 MED ORDER — TETANUS-DIPHTH-ACELL PERTUSSIS 5-2.5-18.5 LF-MCG/0.5 IM SUSP
0.5000 mL | Freq: Once | INTRAMUSCULAR | Status: AC
  Administered 2017-01-01: 0.5 mL via INTRAMUSCULAR
  Filled 2017-01-01: qty 0.5

## 2017-01-01 MED ORDER — METOCLOPRAMIDE HCL 5 MG PO TABS: 5.0000 mg | ORAL_TABLET | Freq: Three times a day (TID) | ORAL | Status: DC | PRN

## 2017-01-01 MED ORDER — ONDANSETRON HCL 4 MG/2ML IJ SOLN
INTRAMUSCULAR | Status: AC
  Filled 2017-01-01: qty 2

## 2017-01-01 MED ORDER — PROPOFOL 10 MG/ML IV BOLUS
INTRAVENOUS | Status: AC
  Filled 2017-01-01: qty 20

## 2017-01-01 MED ORDER — BUPIVACAINE-EPINEPHRINE (PF) 0.5% -1:200000 IJ SOLN
INTRAMUSCULAR | Status: AC
  Filled 2017-01-01: qty 30

## 2017-01-01 MED ORDER — MIDAZOLAM HCL 5 MG/5ML IJ SOLN
INTRAMUSCULAR | Status: DC | PRN
  Administered 2017-01-01: 2 mg via INTRAVENOUS

## 2017-01-01 MED ORDER — ACETAMINOPHEN 650 MG RE SUPP: 650.0000 mg | Freq: Four times a day (QID) | RECTAL | Status: DC | PRN

## 2017-01-01 MED ORDER — HYDROMORPHONE HCL 1 MG/ML IJ SOLN
INTRAMUSCULAR | Status: AC
  Filled 2017-01-01: qty 1

## 2017-01-01 MED ORDER — ONDANSETRON HCL 4 MG/2ML IJ SOLN
4.0000 mg | Freq: Once | INTRAMUSCULAR | Status: AC
  Administered 2017-01-01: 4 mg via INTRAVENOUS
  Filled 2017-01-01: qty 2

## 2017-01-01 MED ORDER — MIDAZOLAM HCL 2 MG/2ML IJ SOLN
INTRAMUSCULAR | Status: AC
  Filled 2017-01-01: qty 2

## 2017-01-01 MED ORDER — 0.9 % SODIUM CHLORIDE (POUR BTL) OPTIME
TOPICAL | Status: DC | PRN
  Administered 2017-01-01: 1000 mL

## 2017-01-01 MED ORDER — SUGAMMADEX SODIUM 200 MG/2ML IV SOLN
INTRAVENOUS | Status: AC
  Filled 2017-01-01: qty 2

## 2017-01-01 MED ORDER — POLYETHYLENE GLYCOL 3350 17 G PO PACK: 17.0000 g | PACK | Freq: Every day | ORAL | Status: DC | PRN

## 2017-01-01 MED ORDER — ROCURONIUM BROMIDE 100 MG/10ML IV SOLN
INTRAVENOUS | Status: DC | PRN
  Administered 2017-01-01: 10 mg via INTRAVENOUS
  Administered 2017-01-01: 50 mg via INTRAVENOUS

## 2017-01-01 MED ORDER — MORPHINE SULFATE (PF) 2 MG/ML IV SOLN
0.5000 mg | INTRAVENOUS | Status: DC | PRN
Start: 2017-01-01 — End: 2017-01-01

## 2017-01-01 MED ORDER — HYDROXYZINE HCL 25 MG PO TABS
25.0000 mg | ORAL_TABLET | Freq: Two times a day (BID) | ORAL | Status: DC | PRN
  Administered 2017-01-04: 25 mg via ORAL
  Filled 2017-01-01: qty 1

## 2017-01-01 MED ORDER — SODIUM CHLORIDE 0.9 % IJ SOLN
INTRAMUSCULAR | Status: DC | PRN
  Administered 2017-01-01: 30 mL

## 2017-01-01 MED ORDER — FENTANYL CITRATE (PF) 100 MCG/2ML IJ SOLN
INTRAMUSCULAR | Status: DC | PRN
  Administered 2017-01-01 (×5): 50 ug via INTRAVENOUS

## 2017-01-01 MED ORDER — FENTANYL CITRATE (PF) 100 MCG/2ML IJ SOLN
50.0000 ug | Freq: Once | INTRAMUSCULAR | Status: AC
  Administered 2017-01-01: 50 ug via INTRAVENOUS
  Filled 2017-01-01: qty 2

## 2017-01-01 MED ORDER — METOCLOPRAMIDE HCL 5 MG/ML IJ SOLN: 5.0000 mg | Freq: Three times a day (TID) | INTRAMUSCULAR | Status: DC | PRN

## 2017-01-01 MED ORDER — ALBUTEROL SULFATE (2.5 MG/3ML) 0.083% IN NEBU: 2.5000 mg | INHALATION_SOLUTION | RESPIRATORY_TRACT | Status: DC | PRN

## 2017-01-01 MED ORDER — METHOCARBAMOL 500 MG PO TABS
500.0000 mg | ORAL_TABLET | Freq: Four times a day (QID) | ORAL | Status: DC | PRN
  Administered 2017-01-02 – 2017-01-03 (×2): 500 mg via ORAL
  Filled 2017-01-01 (×2): qty 1

## 2017-01-01 MED ORDER — AMOXICILLIN-POT CLAVULANATE 875-125 MG PO TABS
1.0000 | ORAL_TABLET | Freq: Once | ORAL | Status: AC
  Administered 2017-01-01: 1 via ORAL
  Filled 2017-01-01: qty 1

## 2017-01-01 MED ORDER — ONDANSETRON HCL 4 MG/2ML IJ SOLN
INTRAMUSCULAR | Status: DC | PRN
  Administered 2017-01-01: 4 mg via INTRAVENOUS

## 2017-01-01 MED ORDER — RISPERIDONE 1 MG PO TABS
1.0000 mg | ORAL_TABLET | Freq: Two times a day (BID) | ORAL | Status: DC
  Administered 2017-01-01 – 2017-01-04 (×6): 1 mg via ORAL
  Filled 2017-01-01 (×6): qty 1

## 2017-01-01 MED ORDER — EPHEDRINE SULFATE 50 MG/ML IJ SOLN
INTRAMUSCULAR | Status: DC | PRN
  Administered 2017-01-01: 15 mg via INTRAVENOUS
  Administered 2017-01-01: 5 mg via INTRAVENOUS

## 2017-01-01 MED ORDER — SODIUM CHLORIDE 0.9 % IR SOLN
Status: DC | PRN
  Administered 2017-01-01: 3000 mL

## 2017-01-01 MED ORDER — ROCURONIUM BROMIDE 50 MG/5ML IV SOLN
INTRAVENOUS | Status: AC
  Filled 2017-01-01: qty 2

## 2017-01-01 MED ORDER — ALBUMIN HUMAN 5 % IV SOLN
INTRAVENOUS | Status: DC | PRN
  Administered 2017-01-01 (×2): via INTRAVENOUS

## 2017-01-01 MED ORDER — PROPOFOL 10 MG/ML IV BOLUS
INTRAVENOUS | Status: DC | PRN
  Administered 2017-01-01: 100 mg via INTRAVENOUS

## 2017-01-01 MED ORDER — MORPHINE SULFATE (PF) 4 MG/ML IV SOLN
0.5000 mg | INTRAVENOUS | Status: DC | PRN
  Administered 2017-01-01 (×2): 0.52 mg via INTRAVENOUS
  Filled 2017-01-01 (×2): qty 1

## 2017-01-01 MED ORDER — LACTATED RINGERS IV SOLN
INTRAVENOUS | Status: DC
  Administered 2017-01-01 (×2): via INTRAVENOUS

## 2017-01-01 MED ORDER — METHOCARBAMOL 1000 MG/10ML IJ SOLN: 500.0000 mg | Freq: Four times a day (QID) | INTRAVENOUS | Status: DC | PRN

## 2017-01-01 MED ORDER — ACETAMINOPHEN 10 MG/ML IV SOLN
INTRAVENOUS | Status: AC
  Filled 2017-01-01: qty 100

## 2017-01-01 MED ORDER — MENTHOL 3 MG MT LOZG: 1.0000 | LOZENGE | OROMUCOSAL | Status: DC | PRN

## 2017-01-01 MED ORDER — DEXAMETHASONE SODIUM PHOSPHATE 10 MG/ML IJ SOLN
INTRAMUSCULAR | Status: AC
  Filled 2017-01-01: qty 1

## 2017-01-01 MED ORDER — ENOXAPARIN SODIUM 40 MG/0.4ML ~~LOC~~ SOLN: 40.0000 mg | SUBCUTANEOUS | Status: DC

## 2017-01-01 SURGICAL SUPPLY — 56 items
ADH SKN CLS APL DERMABOND .7 (GAUZE/BANDAGES/DRESSINGS) ×1
ALCOHOL ISOPROPYL (RUBBING) (MISCELLANEOUS) ×2 IMPLANT
BLADE CLIPPER SURG (BLADE) IMPLANT
CAPT HIP TOTAL 2 ×1 IMPLANT
CHLORAPREP W/TINT 26ML (MISCELLANEOUS) ×2 IMPLANT
COVER SURGICAL LIGHT HANDLE (MISCELLANEOUS) ×2 IMPLANT
DERMABOND ADVANCED (GAUZE/BANDAGES/DRESSINGS) ×1
DERMABOND ADVANCED .7 DNX12 (GAUZE/BANDAGES/DRESSINGS) ×2 IMPLANT
DRAPE C-ARM 42X72 X-RAY (DRAPES) ×2 IMPLANT
DRAPE STERI IOBAN 125X83 (DRAPES) ×2 IMPLANT
DRAPE U-SHAPE 47X51 STRL (DRAPES) ×6 IMPLANT
DRSG AQUACEL AG ADV 3.5X10 (GAUZE/BANDAGES/DRESSINGS) ×2 IMPLANT
ELECT BLADE 4.0 EZ CLEAN MEGAD (MISCELLANEOUS) ×2
ELECT REM PT RETURN 9FT ADLT (ELECTROSURGICAL) ×2
ELECTRODE BLDE 4.0 EZ CLN MEGD (MISCELLANEOUS) ×1 IMPLANT
ELECTRODE REM PT RTRN 9FT ADLT (ELECTROSURGICAL) ×1 IMPLANT
EVACUATOR 1/8 PVC DRAIN (DRAIN) IMPLANT
FLUID NSS /IRRIG 3000 ML XXX (IV SOLUTION) ×1 IMPLANT
GLOVE BIO SURGEON STRL SZ8.5 (GLOVE) ×4 IMPLANT
GLOVE BIOGEL PI IND STRL 8.5 (GLOVE) ×1 IMPLANT
GLOVE BIOGEL PI INDICATOR 8.5 (GLOVE) ×1
GOWN STRL REUS W/ TWL LRG LVL3 (GOWN DISPOSABLE) ×2 IMPLANT
GOWN STRL REUS W/TWL 2XL LVL3 (GOWN DISPOSABLE) ×2 IMPLANT
GOWN STRL REUS W/TWL LRG LVL3 (GOWN DISPOSABLE) ×4
HANDPIECE INTERPULSE COAX TIP (DISPOSABLE) ×2
HOOD PEEL AWAY FACE SHEILD DIS (HOOD) ×4 IMPLANT
IV NS 1000ML (IV SOLUTION) ×2
IV NS 1000ML BAXH (IV SOLUTION) IMPLANT
KIT BASIN OR (CUSTOM PROCEDURE TRAY) ×2 IMPLANT
KIT ROOM TURNOVER OR (KITS) ×2 IMPLANT
MANIFOLD NEPTUNE II (INSTRUMENTS) ×2 IMPLANT
MARKER SKIN DUAL TIP RULER LAB (MISCELLANEOUS) ×4 IMPLANT
NDL SPNL 18GX3.5 QUINCKE PK (NEEDLE) ×1 IMPLANT
NEEDLE SPNL 18GX3.5 QUINCKE PK (NEEDLE) ×2 IMPLANT
NS IRRIG 1000ML POUR BTL (IV SOLUTION) ×2 IMPLANT
PACK TOTAL JOINT (CUSTOM PROCEDURE TRAY) ×2 IMPLANT
PACK UNIVERSAL I (CUSTOM PROCEDURE TRAY) ×2 IMPLANT
PAD ARMBOARD 7.5X6 YLW CONV (MISCELLANEOUS) ×4 IMPLANT
SAW OSC TIP CART 19.5X105X1.3 (SAW) ×2 IMPLANT
SEALER BIPOLAR AQUA 6.0 (INSTRUMENTS) ×1 IMPLANT
SET HNDPC FAN SPRY TIP SCT (DISPOSABLE) ×1 IMPLANT
SOL PREP POV-IOD 4OZ 10% (MISCELLANEOUS) ×1 IMPLANT
SUT ETHIBOND NAB CT1 #1 30IN (SUTURE) ×4 IMPLANT
SUT MNCRL AB 3-0 PS2 18 (SUTURE) ×2 IMPLANT
SUT MON AB 2-0 CT1 36 (SUTURE) ×2 IMPLANT
SUT VIC AB 1 CT1 27 (SUTURE) ×2
SUT VIC AB 1 CT1 27XBRD ANBCTR (SUTURE) ×1 IMPLANT
SUT VIC AB 2-0 CT1 27 (SUTURE) ×4
SUT VIC AB 2-0 CT1 TAPERPNT 27 (SUTURE) ×1 IMPLANT
SUT VLOC 180 0 24IN GS25 (SUTURE) ×2 IMPLANT
SYR 50ML LL SCALE MARK (SYRINGE) ×2 IMPLANT
TOWEL OR 17X24 6PK STRL BLUE (TOWEL DISPOSABLE) ×2 IMPLANT
TOWEL OR 17X26 10 PK STRL BLUE (TOWEL DISPOSABLE) ×2 IMPLANT
TRAY CATH 16FR W/PLASTIC CATH (SET/KITS/TRAYS/PACK) IMPLANT
TRAY FOLEY CATH SILVER 16FR (SET/KITS/TRAYS/PACK) IMPLANT
WATER STERILE IRR 1000ML POUR (IV SOLUTION) ×6 IMPLANT

## 2017-01-01 NOTE — Op Note (Signed)
OPERATIVE REPORT  SURGEON: Samson Frederic, MD   ASSISTANT: Patrick Jupiter, RNFA.  PREOPERATIVE DIAGNOSIS: Displaced Left femoral neck fracture.   POSTOPERATIVE DIAGNOSIS: Displaced Left femoral neck fracture.   PROCEDURE: Left total hip arthroplasty, anterior approach.   IMPLANTS: DePuy Tri Lock stem, size 4, hi offset. DePuy Pinnacle Cup, size 50 mm. DePuy Altrx liner, size 32 by 50 mm, neutral. DePuy Biolox ceramic head ball, size 32 + 5 mm.  ANESTHESIA:  General  ESTIMATED BLOOD LOSS: 1500 mL.  ANTIBIOTICS: 2 g Ancef.  DRAINS: None.  COMPLICATIONS: None.   CONDITION: PACU - hemodynamically stable.   BRIEF CLINICAL NOTE: Terry Potter is a 61 y.o. female with a displaced Left femoral neck fracture. The patient was admitted to the hospitalist service and underwent perioperative risk stratification and medical optimization. The risks, benefits, and alternatives to hemiarthroplasty were explained, and the patient elected to proceed.  PROCEDURE IN DETAIL: Surgical site was marked by myself in the pre-op holding area. Once inside the operating room, spinal anesthesia was obtained, and a foley catheter was inserted. The patient was then positioned on the Hana table. All bony prominences were well padded. The hip was prepped and draped in the normal sterile surgical fashion. A time-out was called verifying side and site of surgery. The patient received IV antibiotics within 60 minutes of beginning the procedure.  The direct anterior approach to the hip was performed through the Hueter interval. Lateral femoral circumflex vessels were treated with the Auqumantys. The anterior capsule was exposed and an inverted T capsulotomy was made. Fracture hematoma was encountered and evacuated. The patient was found to have a comminuted Left transcervical femoral neck fracture. I freshened the femoral neck cut with a saw. I removed the femoral neck fragment. A corkscrew was placed  into the head and the head was removed. This was passed to the back table and was measured.  Acetabular exposure was achieved, and the pulvinar and labrum were excised. Sequential reaming of the acetabulum was then performed up to a size 49 mm reamer. Patient did have a significant amount of bleeding from the bony surfaces. A 50 mm cup was then opened and impacted into place at approximately 40 degrees of abduction and 20 degrees of anteversion. I elected to augment the already acceptable press-fit fixation with a single 6.5 mm cancellus bone screw. The final polyethylene liner was impacted into place.   I then gained femoral exposure taking care to protect the abductors and greater trochanter. This was performed using standard external rotation, extension, and adduction. The capsule was peeled off the inner aspect of the greater trochanter, taking care to preserve the short external rotators. A cookie cutter was used to enter the femoral canal, and then the femoral canal finder was placed. Sequential broaching was performed up to a size 4. Calcar planer was used on the femoral neck remnant. I placed a standard offset neck and a trial head ball. The hip was reduced. Leg lengths and offset were checked fluoroscopically.  High offset neck needed to re-create soft tissue tension. The hip was dislocated and trial components were removed. The final implants were placed, and the hip was reduced.  Fluoroscopy was used to confirm component position and leg lengths. At 90 degrees of external rotation and full extension, the hip was stable to an anterior directed force.  The wound was copiously irrigated with normal saline using pulse lavage. Marcaine solution was injected into the periarticular soft tissue. Hemostasis was achieved. The wound was  closed in layers using #1 Vicryl and V-Loc for the fascia, 2-0 Vicryl for the subcutaneous fat, 2-0 Monocryl for the deep dermal layer, 3-0 running Monocryl  subcuticular stitch, and Dermabond for the skin. Once the glue was fully dried, an Aquacell Ag dressing was applied. The patient was transported to the recovery room in stable condition. Sponge, needle, and instrument counts were correct at the end of the case x2. The patient tolerated the procedure well and there were no known complications.

## 2017-01-01 NOTE — ED Provider Notes (Signed)
[facility]-EMERGENCY DEPT Provider Note   CSN: [id number] Arrival date & time: 01/01/17  0709     History   Chief Complaint No chief complaint on file.   HPI Terry Potter is a 61 y.o. female.  HPI  61 year old female with past medical history of bipolar disorder here with hip pain after fall. The patient states she was out all night at a party. When she was returning home this morning, she states that she was attacked by 3 dogs at a neighbors house. They came out and started to bite and attack her. She fell down area she states she may have lost consciousness while she was falling. She reports immediate onset of severe left hip pain. She has been unable to walk since this episode due to this pain. She also endorses a superficial wound to her right leg and left leg. Denies any direct head injuries. Denies any preceding or subsequent chest pain. No other medical complaints.  Past Medical History:  Diagnosis Date  . Arthritis   . Bipolar disorder (HCC)   . Ectopic pregnancy     Patient Active Problem List   Diagnosis Date Noted  . Hip fracture, unspecified laterality, closed, initial encounter (HCC) 01/01/2017  . Hip fracture (HCC) 01/01/2017  . Schizoaffective disorder, bipolar type (HCC) 03/19/2015    Class: Chronic    Past Surgical History:  Procedure Laterality Date  . ABDOMINAL SURGERY    . TUMOR REMOVAL     Uterine tumor removed    OB History    No data available       Home Medications    Prior to Admission medications   Medication Sig Start Date End Date Taking? Authorizing Provider  aspirin 325 MG tablet Take 325 mg by mouth daily.   Yes [provider]  fluticasone (FLONASE) 50 MCG/ACT nasal spray Place 1 spray into both nostrils daily as needed for rhinitis.   Yes [provider]  gabapentin (NEURONTIN) 300 MG capsule Take 300 mg by mouth 3 (three) times daily.   Yes [provider]  hydrOXYzine (ATARAX/VISTARIL) 25 MG tablet  Take 25 mg by mouth 2 (two) times daily as needed for anxiety (and/or agitation).   Yes [provider]  Multiple Vitamin (MULTIVITAMIN WITH MINERALS) TABS tablet Take 1 tablet by mouth daily.   Yes [provider]  naproxen (NAPROSYN) 500 MG tablet Take 500 mg by mouth 2 (two) times daily as needed for mild pain.   Yes [provider]  Oxcarbazepine (TRILEPTAL) 300 MG tablet Take 300 mg by mouth 3 (three) times daily.   Yes [provider]  PROAIR HFA 108 (90 BASE) MCG/ACT inhaler Inhale 1-2 puffs into the lungs every 4 (four) hours as needed for wheezing or shortness of breath.  04/03/14  Yes [provider]  risperiDONE (RISPERDAL) 1 MG tablet Take 1 mg by mouth 2 (two) times daily.   Yes [provider]    Family History History reviewed. No pertinent family history.  Social History Social History  Substance Use Topics  . Smoking status: Current Every Day Smoker    Packs/day: 0.25    Types: Cigarettes, Cigars  . Smokeless tobacco: Never Used  . Alcohol use Yes     Comment: occ     Allergies   Tetracyclines & related   Review of Systems Review of Systems  Constitutional: Positive for fatigue.  Musculoskeletal: Positive for arthralgias and gait problem.  Skin: Positive for wound.  All other systems  reviewed and are negative.    Physical Exam Updated Vital Signs BP 120/74   Pulse 64   Resp 14   SpO2 97%   Physical Exam  Constitutional: She is oriented to person, place, and time. She appears well-developed and well-nourished. No distress.  HENT:  Head: Normocephalic and atraumatic.  Markedly dry MM. Poor dentition  Eyes: Conjunctivae are normal.  Neck: Neck supple.  Cardiovascular: Normal rate, regular rhythm and normal heart sounds.  Exam reveals no friction rub.   No murmur heard. Pulmonary/Chest: Effort normal and breath sounds normal. No respiratory distress. She has no wheezes. She has no rales.    Abdominal: She exhibits no distension.  Musculoskeletal: She exhibits tenderness (exquisite TTP over left greater trochanter, with TTP upon pROM of hip; strength 5/5 distally). She exhibits no edema.  Neurological: She is alert and oriented to person, place, and time. She exhibits normal muscle tone.  Skin: Skin is warm. Capillary refill takes less than 2 seconds.  Superficial abrasion to left thigh  Psychiatric: She has a normal mood and affect.  Nursing note and vitals reviewed.    ED Treatments / Results  Labs (all labs ordered are listed, but only abnormal results are displayed) Labs Reviewed  COMPREHENSIVE METABOLIC PANEL - Abnormal; Notable for the following:       Result Value   Potassium 3.4 (*)    Glucose, Bld 101 (*)    All other components within normal limits  RAPID URINE DRUG SCREEN, HOSP PERFORMED - Abnormal; Notable for the following:    Cocaine POSITIVE (*)    All other components within normal limits  CBC WITH DIFFERENTIAL/PLATELET  URINALYSIS, ROUTINE W REFLEX MICROSCOPIC  HIV ANTIBODY (ROUTINE TESTING)  CREATININE, SERUM  URINALYSIS, ROUTINE W REFLEX MICROSCOPIC  I-STAT TROPONIN, ED    EKG  EKG Interpretation  Date/Time:  Friday January 01 2017 07:51:40 EDT Ventricular Rate:  70 PR Interval:    QRS Duration: 81 QT Interval:  405 QTC Calculation: 437 R Axis:   75 Text Interpretation:  Sinus rhythm No significant change since last tracing Confirmed by Shaune Pollack [phone]) on 01/01/2017 8:12:39 AM       Radiology Dg Chest 2 View  Result Date: 01/01/2017 CLINICAL DATA:  61 year old female with history of attack by a canine during which she fell to the ground. Body pain. EXAM: CHEST  2 VIEW COMPARISON:  Chest x-ray a 12/16/2014. FINDINGS: Lung volumes are normal. No consolidative airspace disease. No pleural effusions. No pneumothorax. No pulmonary nodule or mass noted. Pulmonary vasculature and the cardiomediastinal silhouette are within normal limits.  Atherosclerosis in the thoracic aorta. IMPRESSION: 1.  No radiographic evidence of acute cardiopulmonary disease. 2. Aortic atherosclerosis. Aortic Atherosclerosis (ICD10-I70.0). Electronically Signed   By: Trudie Reed M.D.   On: 01/01/2017 08:30   Ct Head Wo Contrast  Result Date: 01/01/2017 CLINICAL DATA:  Recent fall, possible head injury EXAM: CT HEAD WITHOUT CONTRAST TECHNIQUE: Contiguous axial images were obtained from the base of the skull through the vertex without intravenous contrast. COMPARISON:  None available FINDINGS: Brain: Minor patchy chronic white matter microvascular ischemic changes throughout both cerebral hemispheres. No acute intracranial hemorrhage, mass lesion, new infarction, midline shift, herniation, hydrocephalus, or extra-axial fluid collection. No focal mass effect or edema. Cisterns are patent. No cerebellar abnormality. Vascular: Intracranial atherosclerosis at the skullbase. Skull: Normal. Negative for fracture or focal lesion. Sinuses/Orbits: No acute finding. Other: None. IMPRESSION: Minor chronic white matter microvascular ischemic changes. No acute intracranial abnormality by  noncontrast CT. Electronically Signed   By: Judie Petit.  Shick M.D.   On: 01/01/2017 08:34   Dg Knee Complete 4 Views Right  Result Date: 01/01/2017 CLINICAL DATA:  Fall with right knee pain.  Initial encounter. EXAM: RIGHT KNEE - COMPLETE 4+ VIEW COMPARISON:  None. FINDINGS: Question anterior soft tissue injury. No opaque foreign body. No evidence of fracture, dislocation, or joint effusion. Possible patellofemoral joint narrowing but no notable spurring. IMPRESSION: No acute osseous finding or joint effusion. Electronically Signed   By: Marnee Spring M.D.   On: 01/01/2017 08:30   Dg Hip Unilat W Or Wo Pelvis 2-3 Views Left  Result Date: 01/01/2017 CLINICAL DATA:  61 year old female with history of attack by a canine this morning, during which she fell to the ground. Left hip and right knee pain.  EXAM: DG HIP (WITH OR WITHOUT PELVIS) 2-3V LEFT COMPARISON:  None. FINDINGS: Multiple views of the bony pelvis and hips bilaterally demonstrates a mildly comminuted and mildly displaced transcervical left femoral neck fracture. This appears mildly impacted, with at least 13 mm of proximal migration of the distal fracture fragment, and slight varus angulation. Left femoral head remains located in the acetabulum. Remaining portions of the bony pelvis and right hip are otherwise normal in appearance. Multiple pelvic phleboliths are noted. IMPRESSION: 1. Mildly comminuted, angulated and displaced left-sided transcervical femoral neck fracture, as above. Electronically Signed   By: Trudie Reed M.D.   On: 01/01/2017 08:33    Procedures Procedures (including critical care time)  Medications Ordered in ED Medications  HYDROcodone-acetaminophen (NORCO/VICODIN) 5-325 MG per tablet 1-2 tablet (not administered)  morphine 2 MG/ML injection 0.5 mg (not administered)  enoxaparin (LOVENOX) injection 40 mg (not administered)  methocarbamol (ROBAXIN) tablet 500 mg (not administered)    Or  methocarbamol (ROBAXIN) 500 mg in dextrose 5 % 50 mL IVPB (not administered)  senna (SENOKOT) tablet 8.6 mg (not administered)  docusate sodium (COLACE) capsule 100 mg (not administered)  ondansetron (ZOFRAN) injection 4 mg (not administered)  0.9 % NaCl with KCl 40 mEq / L  infusion (not administered)  amoxicillin-clavulanate (AUGMENTIN) 875-125 MG per tablet 1 tablet (not administered)  sodium chloride 0.9 % bolus 1,000 mL (0 mLs Intravenous Stopped 01/01/17 0956)  fentaNYL (SUBLIMAZE) injection 50 mcg (50 mcg Intravenous Given 01/01/17 0851)  ondansetron (ZOFRAN) injection 4 mg (4 mg Intravenous Given 01/01/17 0851)  Tdap (BOOSTRIX) injection 0.5 mL (0.5 mLs Intramuscular Given 01/01/17 1003)  amoxicillin-clavulanate (AUGMENTIN) 875-125 MG per tablet 1 tablet (1 tablet Oral Given 01/01/17 1003)     Initial Impression  / Assessment and Plan / ED Course  I have reviewed the triage vital signs and the nursing notes.  Pertinent labs & imaging results that were available during my care of the patient were reviewed by me and considered in my medical decision making (see chart for details).     61 yo F with h/o bipolar disorder here with left hip pain s/p fall. Plain films show left-sided transvervical femoral neck fx. Pt is NVI distal to this. Will consult Ortho, admit to medicine. Regarding her ? Syncope, may be related to pain, but pt also reports cocaine use and appears dehydrated. Screening labs unremarkable. Pt given fluids. EKG non-ischemic w/o arrhythmia. Monitor on tele. Regarding her superficial wound, tetanus updated, will place on Augmentin. Dogs have reportedly been assessed by PD/Animal Control - will hold on rabies vaccination.  Final Clinical Impressions(s) / ED Diagnoses   Final diagnoses:  Left displaced femoral neck  fracture (HCC)  Syncope, unspecified syncope type  Animal bite    New Prescriptions New Prescriptions   No medications on file     Shaune Pollack, MD 01/01/17 1042

## 2017-01-01 NOTE — Anesthesia Preprocedure Evaluation (Addendum)
Anesthesia Evaluation  Patient identified by MRN, date of birth, ID band Patient awake    Reviewed: Allergy & Precautions, H&P , NPO status , Patient's Chart, lab work & pertinent test results  Airway Mallampati: II  TM Distance: >3 FB Neck ROM: Full    Dental no notable dental hx. (+) Partial Lower, Partial Upper, Poor Dentition, Dental Advisory Given   Pulmonary Current Smoker,    Pulmonary exam normal breath sounds clear to auscultation       Cardiovascular negative cardio ROS   Rhythm:Regular Rate:Normal     Neuro/Psych Bipolar Disorder Schizophrenia negative neurological ROS     GI/Hepatic negative GI ROS, Neg liver ROS,   Endo/Other  negative endocrine ROS  Renal/GU negative Renal ROS  negative genitourinary   Musculoskeletal  (+) Arthritis , Osteoarthritis,    Abdominal   Peds  Hematology negative hematology ROS (+)   Anesthesia Other Findings   Reproductive/Obstetrics negative OB ROS                            Anesthesia Physical Anesthesia Plan  ASA: II  Anesthesia Plan: General   Post-op Pain Management:    Induction: Intravenous  PONV Risk Score and Plan: 3 and Ondansetron, Dexamethasone and Midazolam  Airway Management Planned: Oral ETT  Additional Equipment:   Intra-op Plan:   Post-operative Plan: Extubation in OR  Informed Consent: I have reviewed the patients History and Physical, chart, labs and discussed the procedure including the risks, benefits and alternatives for the proposed anesthesia with the patient or authorized representative who has indicated his/her understanding and acceptance.   Dental advisory given  Plan Discussed with: CRNA  Anesthesia Plan Comments:         Anesthesia Quick Evaluation

## 2017-01-01 NOTE — ED Notes (Signed)
Bed: WA21 Expected date:  Expected time:  Means of arrival:  Comments: 61 yo F  Animal bite, fall right forearm pain

## 2017-01-01 NOTE — Progress Notes (Signed)
Orthopedic Tech Progress Note Patient Details:  Terry Potter 1956/02/20 [id number]  Ortho Devices Ortho Device/Splint Location: applied ohf to bed Ortho Device/Splint Interventions: Ordered, Application   Jennye Moccasin 01/01/2017, 10:16 PM

## 2017-01-01 NOTE — Consult Note (Signed)
ORTHOPAEDIC CONSULTATION  REQUESTING PHYSICIAN: Richarda Overlie, MD  PCP:  Erlinda Hong, MD  Chief Complaint: Displaced left femoral neck fracture  HPI: Terry Potter is a 61 y.o. female who complains of left hip pain. Patient was attacked by 3 dogs earlier today, and she fell onto her left hip. She is noted to have some bite injuries to the lower legs. She denies any additional injuries. She has left hip pain and inability to weight-bear. She was taken to the emergency department at [facility], where x-rays revealed a displaced left femoral neck fracture. She was admitted to the hospitalist service. Orthopedic consultation was placed for management of her left hip fracture. Due to unavailability of the OR, she was transferred to [facility] for surgical management.  Past Medical History:  Diagnosis Date  . Arthritis   . Bipolar disorder (HCC)   . Ectopic pregnancy    Past Surgical History:  Procedure Laterality Date  . ABDOMINAL SURGERY    . TUMOR REMOVAL     Uterine tumor removed   Social History   Social History  . Marital status: Divorced    Spouse name: N/A  . Number of children: N/A  . Years of education: N/A   Social History Main Topics  . Smoking status: Current Every Day Smoker    Packs/day: 0.25    Types: Cigarettes, Cigars  . Smokeless tobacco: Never Used  . Alcohol use Yes     Comment: occ  . Drug use: Yes    Types: Cocaine  . Sexual activity: Not Asked   Other Topics Concern  . None   Social History Narrative  . None   History reviewed. No pertinent family history. Allergies  Allergen Reactions  . Tetracyclines & Related Hives   Prior to Admission medications   Medication Sig Start Date End Date Taking? Authorizing Provider  aspirin 325 MG tablet Take 325 mg by mouth daily.   Yes [provider]  fluticasone (FLONASE) 50 MCG/ACT nasal spray Place 1 spray into both nostrils daily as needed for rhinitis.   Yes [provider]  gabapentin (NEURONTIN) 300 MG capsule Take 300 mg by mouth 3 (three) times daily.   Yes [provider]  hydrOXYzine (ATARAX/VISTARIL) 25 MG tablet Take 25 mg by mouth 2 (two) times daily as needed for anxiety (and/or agitation).   Yes [provider]  Multiple Vitamin (MULTIVITAMIN WITH MINERALS) TABS tablet Take 1 tablet by mouth daily.   Yes [provider]  naproxen (NAPROSYN) 500 MG tablet Take 500 mg by mouth 2 (two) times daily as needed for mild pain.   Yes [provider]  Oxcarbazepine (TRILEPTAL) 300 MG tablet Take 300 mg by mouth 3 (three) times daily.   Yes [provider]  PROAIR HFA 108 (90 BASE) MCG/ACT inhaler Inhale 1-2 puffs into the lungs every 4 (four) hours as needed for wheezing or shortness of breath.  04/03/14  Yes [provider]  risperiDONE (RISPERDAL) 1 MG tablet Take 1 mg by mouth 2 (two) times daily.   Yes [provider]   Dg Chest 2 View  Result Date: 01/01/2017 CLINICAL DATA:  61 year old female with history of attack by a canine during which she fell to the ground. Body pain. EXAM: CHEST  2 VIEW COMPARISON:  Chest x-ray a 12/16/2014. FINDINGS: Lung volumes are normal. No consolidative airspace disease. No pleural effusions. No pneumothorax. No pulmonary nodule or mass noted. Pulmonary vasculature and the cardiomediastinal silhouette are  within normal limits. Atherosclerosis in the thoracic aorta. IMPRESSION: 1.  No radiographic evidence of acute cardiopulmonary disease. 2. Aortic atherosclerosis. Aortic Atherosclerosis (ICD10-I70.0). Electronically Signed   By: Trudie Reed M.D.   On: 01/01/2017 08:30   Ct Head Wo Contrast  Result Date: 01/01/2017 CLINICAL DATA:  Recent fall, possible head injury EXAM: CT HEAD WITHOUT CONTRAST TECHNIQUE: Contiguous axial images were obtained from the base of the skull through the vertex without intravenous contrast. COMPARISON:  None available  FINDINGS: Brain: Minor patchy chronic white matter microvascular ischemic changes throughout both cerebral hemispheres. No acute intracranial hemorrhage, mass lesion, new infarction, midline shift, herniation, hydrocephalus, or extra-axial fluid collection. No focal mass effect or edema. Cisterns are patent. No cerebellar abnormality. Vascular: Intracranial atherosclerosis at the skullbase. Skull: Normal. Negative for fracture or focal lesion. Sinuses/Orbits: No acute finding. Other: None. IMPRESSION: Minor chronic white matter microvascular ischemic changes. No acute intracranial abnormality by noncontrast CT. Electronically Signed   By: Judie Petit.  Shick M.D.   On: 01/01/2017 08:34   Dg Knee Complete 4 Views Right  Result Date: 01/01/2017 CLINICAL DATA:  Fall with right knee pain.  Initial encounter. EXAM: RIGHT KNEE - COMPLETE 4+ VIEW COMPARISON:  None. FINDINGS: Question anterior soft tissue injury. No opaque foreign body. No evidence of fracture, dislocation, or joint effusion. Possible patellofemoral joint narrowing but no notable spurring. IMPRESSION: No acute osseous finding or joint effusion. Electronically Signed   By: Marnee Spring M.D.   On: 01/01/2017 08:30   Dg Hip Unilat W Or Wo Pelvis 2-3 Views Left  Result Date: 01/01/2017 CLINICAL DATA:  61 year old female with history of attack by a canine this morning, during which she fell to the ground. Left hip and right knee pain. EXAM: DG HIP (WITH OR WITHOUT PELVIS) 2-3V LEFT COMPARISON:  None. FINDINGS: Multiple views of the bony pelvis and hips bilaterally demonstrates a mildly comminuted and mildly displaced transcervical left femoral neck fracture. This appears mildly impacted, with at least 13 mm of proximal migration of the distal fracture fragment, and slight varus angulation. Left femoral head remains located in the acetabulum. Remaining portions of the bony pelvis and right hip are otherwise normal in appearance. Multiple pelvic phleboliths are  noted. IMPRESSION: 1. Mildly comminuted, angulated and displaced left-sided transcervical femoral neck fracture, as above. Electronically Signed   By: Trudie Reed M.D.   On: 01/01/2017 08:33    Positive ROS: All other systems have been reviewed and were otherwise negative with the exception of those mentioned in the HPI and as above.  Physical Exam: General: Alert, no acute distress Cardiovascular: No pedal edema Respiratory: No cyanosis, no use of accessory musculature GI: No organomegaly, abdomen is soft and non-tender Skin: No lesions in the area of chief complaint Neurologic: Sensation intact distally Psychiatric: Patient is competent for consent with normal mood and affect Lymphatic: No axillary or cervical lymphadenopathy  MUSCULOSKELETAL: Examination of the left lower extremity reveals that her hip is free of wounds. She is shortened and externally rotated. She does have some dressings over the lower legs. She has palpable pulses. There is no focal motor or sensory deficit.  Assessment: Displaced left femoral neck fracture  Plan: I discussed the findings with the patient. Given her young age and activity level, I recommended left total hip arthroplasty. We discussed the risks, benefits, and alternatives. Please see statement of risk. She understands and wishes to proceed.  The risks, benefits, and alternatives were discussed with the patient. There are risks associated  with the surgery including, but not limited to, problems with anesthesia (death), infection, instability (giving out of the joint), dislocation, differences in leg length/angulation/rotation, fracture of bones, loosening or failure of implants, hematoma (blood accumulation) which may require surgical drainage, blood clots, pulmonary embolism, nerve injury (foot drop and lateral thigh numbness), and blood vessel injury. The patient understands these risks and elects to proceed.   Antoneo Ghrist, Cloyde Reams, MD Cell  [phone]    01/01/2017 4:52 PM

## 2017-01-01 NOTE — ED Notes (Signed)
Patient transported to X-ray 

## 2017-01-01 NOTE — H&P (Addendum)
[facility] History and Physical  Terry Potter ZOX:[id number] DOB: 1955/08/12 DOA: 01/01/2017  Referring physician: *ED PCP: Erlinda Hong, MD   Chief Complaint: Fall  HPI:   61 year old female with a history of bipolar disorder, polysubstance abuse,  brought in the ED for evaluation of a fall. Patient was headed home after using drugs with her friends. As she was walking down the street in the dark, she got chased by 3 street dogs. As she was running, she lost her balance and fell. She presents with bite marks on Her ankles and right arm. Patient subsequently fell and lost consciousness. She fell onto her left hip and was unable to walk after the episode. ED course BP (!) 88/50   Pulse 68   Resp 18   SpO2 94%  UDS positive for cocaine, potassium 3.4, troponin negative, CT head essentially negative, hip x-ray showed Mildly comminuted, angulated and displaced left-sided transcervical femoral neck fracture. Patient is being admitted and transferred to [facility] for surgical intervention   Review of Systems: negative for the following  Constitutional: Denies fever, chills, diaphoresis, appetite change and fatigue.  HEENT: Denies photophobia, eye pain, redness, hearing loss, ear pain, congestion, sore throat, rhinorrhea, sneezing, mouth sores, trouble swallowing, neck pain, neck stiffness and tinnitus.  Respiratory: Denies SOB, DOE, cough, chest tightness, and wheezing.  Cardiovascular: Denies chest pain, palpitations and leg swelling.  Gastrointestinal: Denies nausea, vomiting, abdominal pain, diarrhea, constipation, blood in stool and abdominal distention.  Genitourinary: Denies dysuria, urgency, frequency, hematuria, flank pain and difficulty urinating.  Musculoskeletal: Denies myalgias, back pain, positive for left hip pain,   gait problem.  Skin:  Positive for abrasions on the left thigh and right elbow Neurological: Denies dizziness, seizures, syncope, weakness,  light-headedness, numbness and headaches.  Hematological: Denies adenopathy. Easy bruising, personal or family bleeding history  Psychiatric/Behavioral: Denies suicidal ideation, mood changes, confusion, nervousness, sleep disturbance and agitation       Past Medical History:  Diagnosis Date  . Arthritis   . Bipolar disorder (HCC)   . Ectopic pregnancy      Past Surgical History:  Procedure Laterality Date  . ABDOMINAL SURGERY    . TUMOR REMOVAL     Uterine tumor removed      Social History:  reports that she has been smoking Cigarettes and Cigars.  She has been smoking about 0.25 packs per day. She has never used smokeless tobacco. She reports that she drinks alcohol. She reports that she uses drugs, including Cocaine.    Allergies  Allergen Reactions  . Tetracyclines & Related Hives      Family history reviewed and negative for hypertension, diabetes, seizure disorder    Prior to Admission medications   Medication Sig Start Date End Date Taking? Authorizing Provider  aspirin 325 MG tablet Take 325 mg by mouth daily.   Yes [provider]  fluticasone (FLONASE) 50 MCG/ACT nasal spray Place 1 spray into both nostrils daily as needed for rhinitis.   Yes [provider]  gabapentin (NEURONTIN) 300 MG capsule Take 300 mg by mouth 3 (three) times daily.   Yes [provider]  hydrOXYzine (ATARAX/VISTARIL) 25 MG tablet Take 25 mg by mouth 2 (two) times daily as needed for anxiety (and/or agitation).   Yes [provider]  Multiple Vitamin (MULTIVITAMIN WITH MINERALS) TABS tablet Take 1 tablet by mouth daily.   Yes [provider]  naproxen (NAPROSYN) 500 MG tablet Take 500 mg by mouth 2 (two) times daily  as needed for mild pain.   Yes [provider]  Oxcarbazepine (TRILEPTAL) 300 MG tablet Take 300 mg by mouth 3 (three) times daily.   Yes [provider]  PROAIR HFA 108 (90 BASE) MCG/ACT inhaler Inhale 1-2 puffs  into the lungs every 4 (four) hours as needed for wheezing or shortness of breath.  04/03/14  Yes [provider]  risperiDONE (RISPERDAL) 1 MG tablet Take 1 mg by mouth 2 (two) times daily.   Yes [provider]     Physical Exam: Vitals:   01/01/17 0720 01/01/17 0931  BP: (!) 88/50 120/74  Pulse: 68 64  Resp: 18 14  SpO2: 94% 97%        Vitals:   01/01/17 0720 01/01/17 0931  BP: (!) 88/50 120/74  Pulse: 68 64  Resp: 18 14  SpO2: 94% 97%   Constitutional: Comfortable Eyes: PERRL, lids and conjunctivae normal ENMT: Extremely poor oral dental hygiene, dry mucous membranes.  Neck: normal, supple, no masses, no thyromegaly Respiratory: clear to auscultation bilaterally, no wheezing, no crackles. Normal respiratory effort. No accessory muscle use.  Cardiovascular: Regular rate and rhythm, no murmurs / rubs / gallops. No extremity edema. 2+ pedal pulses. No carotid bruits.  Abdomen: no tenderness, no masses palpated. No hepatosplenomegaly. Bowel sounds positive.  Musculoskeletal: no clubbing / cyanosis. Left lower extremity was externally rotated, tenderness to palpation of the left hip. Decreased ROM, no contractures. Normal muscle tone.  Skin: no rashes, lesions, ulcers. No induration Neurologic: CN 2-12 grossly intact. Sensation intact, DTR normal. Strength 5/5 in all 4.  Psychiatric: Normal judgment and insight. Alert and oriented x 3. Normal mood.     Labs on Admission: I have personally reviewed following labs and imaging studies  CBC:  Recent Labs Lab 01/01/17 0746  WBC 7.6  NEUTROABS 5.2  HGB 13.4  HCT 38.6  MCV 89.4  PLT 171    Basic Metabolic Panel:  Recent Labs Lab 01/01/17 0746  NA 137  K 3.4*  CL 101  CO2 26  GLUCOSE 101*  BUN 16  CREATININE 0.94  CALCIUM 9.1    GFR: CrCl cannot be calculated (Unknown ideal weight.).  Liver Function Tests:  Recent Labs Lab 01/01/17 0746  AST 28  ALT 20  ALKPHOS 79  BILITOT 0.4   PROT 7.1  ALBUMIN 3.8   No results for input(s): LIPASE, AMYLASE in the last 168 hours. No results for input(s): AMMONIA in the last 168 hours.  Coagulation Profile: No results for input(s): INR, PROTIME in the last 168 hours. No results for input(s): DDIMER in the last 72 hours.  Cardiac Enzymes: No results for input(s): CKTOTAL, CKMB, CKMBINDEX, TROPONINI in the last 168 hours.  BNP (last 3 results) No results for input(s): PROBNP in the last 8760 hours.  HbA1C: No results for input(s): HGBA1C in the last 72 hours. No results found for: HGBA1C   CBG: No results for input(s): GLUCAP in the last 168 hours.  Lipid Profile: No results for input(s): CHOL, HDL, LDLCALC, TRIG, CHOLHDL, LDLDIRECT in the last 72 hours.  Thyroid Function Tests: No results for input(s): TSH, T4TOTAL, FREET4, T3FREE, THYROIDAB in the last 72 hours.  Anemia Panel: No results for input(s): VITAMINB12, FOLATE, FERRITIN, TIBC, IRON, RETICCTPCT in the last 72 hours.  Urine analysis:    Component Value Date/Time   COLORURINE YELLOW 01/01/2017 0940   APPEARANCEUR CLEAR 01/01/2017 0940   LABSPEC 1.010 01/01/2017 0940   PHURINE 6.0 01/01/2017 0940  GLUCOSEU NEGATIVE 01/01/2017 0940   HGBUR NEGATIVE 01/01/2017 0940   BILIRUBINUR NEGATIVE 01/01/2017 0940   KETONESUR NEGATIVE 01/01/2017 0940   PROTEINUR NEGATIVE 01/01/2017 0940   UROBILINOGEN 1.0 12/16/2014 1325   NITRITE NEGATIVE 01/01/2017 0940   LEUKOCYTESUR NEGATIVE 01/01/2017 0940    Sepsis Labs: @LABRCNTIP (procalcitonin:4,lacticidven:4) )No results found for this or any previous visit (from the past 240 hour(s)).       Radiological Exams on Admission: Dg Chest 2 View  Result Date: 01/01/2017 CLINICAL DATA:  61 year old female with history of attack by a canine during which she fell to the ground. Body pain. EXAM: CHEST  2 VIEW COMPARISON:  Chest x-ray a 12/16/2014. FINDINGS: Lung volumes are normal. No consolidative airspace disease.  No pleural effusions. No pneumothorax. No pulmonary nodule or mass noted. Pulmonary vasculature and the cardiomediastinal silhouette are within normal limits. Atherosclerosis in the thoracic aorta. IMPRESSION: 1.  No radiographic evidence of acute cardiopulmonary disease. 2. Aortic atherosclerosis. Aortic Atherosclerosis (ICD10-I70.0). Electronically Signed   By: 02/16/2015 M.D.   On: 01/01/2017 08:30   Ct Head Wo Contrast  Result Date: 01/01/2017 CLINICAL DATA:  Recent fall, possible head injury EXAM: CT HEAD WITHOUT CONTRAST TECHNIQUE: Contiguous axial images were obtained from the base of the skull through the vertex without intravenous contrast. COMPARISON:  None available FINDINGS: Brain: Minor patchy chronic white matter microvascular ischemic changes throughout both cerebral hemispheres. No acute intracranial hemorrhage, mass lesion, new infarction, midline shift, herniation, hydrocephalus, or extra-axial fluid collection. No focal mass effect or edema. Cisterns are patent. No cerebellar abnormality. Vascular: Intracranial atherosclerosis at the skullbase. Skull: Normal. Negative for fracture or focal lesion. Sinuses/Orbits: No acute finding. Other: None. IMPRESSION: Minor chronic white matter microvascular ischemic changes. No acute intracranial abnormality by noncontrast CT. Electronically Signed   By: 01/03/2017.  Shick M.D.   On: 01/01/2017 08:34   Dg Knee Complete 4 Views Right  Result Date: 01/01/2017 CLINICAL DATA:  Fall with right knee pain.  Initial encounter. EXAM: RIGHT KNEE - COMPLETE 4+ VIEW COMPARISON:  None. FINDINGS: Question anterior soft tissue injury. No opaque foreign body. No evidence of fracture, dislocation, or joint effusion. Possible patellofemoral joint narrowing but no notable spurring. IMPRESSION: No acute osseous finding or joint effusion. Electronically Signed   By: 01/03/2017 M.D.   On: 01/01/2017 08:30   Dg Hip Unilat W Or Wo Pelvis 2-3 Views Left  Result Date:  01/01/2017 CLINICAL DATA:  61 year old female with history of attack by a canine this morning, during which she fell to the ground. Left hip and right knee pain. EXAM: DG HIP (WITH OR WITHOUT PELVIS) 2-3V LEFT COMPARISON:  None. FINDINGS: Multiple views of the bony pelvis and hips bilaterally demonstrates a mildly comminuted and mildly displaced transcervical left femoral neck fracture. This appears mildly impacted, with at least 13 mm of proximal migration of the distal fracture fragment, and slight varus angulation. Left femoral head remains located in the acetabulum. Remaining portions of the bony pelvis and right hip are otherwise normal in appearance. Multiple pelvic phleboliths are noted. IMPRESSION: 1. Mildly comminuted, angulated and displaced left-sided transcervical femoral neck fracture, as above. Electronically Signed   By: 77 M.D.   On: 01/01/2017 08:33   Dg Chest 2 View  Result Date: 01/01/2017 CLINICAL DATA:  61 year old female with history of attack by a canine during which she fell to the ground. Body pain. EXAM: CHEST  2 VIEW COMPARISON:  Chest x-ray a 12/16/2014. FINDINGS: Lung volumes are normal.  No consolidative airspace disease. No pleural effusions. No pneumothorax. No pulmonary nodule or mass noted. Pulmonary vasculature and the cardiomediastinal silhouette are within normal limits. Atherosclerosis in the thoracic aorta. IMPRESSION: 1.  No radiographic evidence of acute cardiopulmonary disease. 2. Aortic atherosclerosis. Aortic Atherosclerosis (ICD10-I70.0). Electronically Signed   By: Trudie Reed M.D.   On: 01/01/2017 08:30   Ct Head Wo Contrast  Result Date: 01/01/2017 CLINICAL DATA:  Recent fall, possible head injury EXAM: CT HEAD WITHOUT CONTRAST TECHNIQUE: Contiguous axial images were obtained from the base of the skull through the vertex without intravenous contrast. COMPARISON:  None available FINDINGS: Brain: Minor patchy chronic white matter microvascular  ischemic changes throughout both cerebral hemispheres. No acute intracranial hemorrhage, mass lesion, new infarction, midline shift, herniation, hydrocephalus, or extra-axial fluid collection. No focal mass effect or edema. Cisterns are patent. No cerebellar abnormality. Vascular: Intracranial atherosclerosis at the skullbase. Skull: Normal. Negative for fracture or focal lesion. Sinuses/Orbits: No acute finding. Other: None. IMPRESSION: Minor chronic white matter microvascular ischemic changes. No acute intracranial abnormality by noncontrast CT. Electronically Signed   By: Judie Petit.  Shick M.D.   On: 01/01/2017 08:34   Dg Knee Complete 4 Views Right  Result Date: 01/01/2017 CLINICAL DATA:  Fall with right knee pain.  Initial encounter. EXAM: RIGHT KNEE - COMPLETE 4+ VIEW COMPARISON:  None. FINDINGS: Question anterior soft tissue injury. No opaque foreign body. No evidence of fracture, dislocation, or joint effusion. Possible patellofemoral joint narrowing but no notable spurring. IMPRESSION: No acute osseous finding or joint effusion. Electronically Signed   By: Marnee Spring M.D.   On: 01/01/2017 08:30   Dg Hip Unilat W Or Wo Pelvis 2-3 Views Left  Result Date: 01/01/2017 CLINICAL DATA:  61 year old female with history of attack by a canine this morning, during which she fell to the ground. Left hip and right knee pain. EXAM: DG HIP (WITH OR WITHOUT PELVIS) 2-3V LEFT COMPARISON:  None. FINDINGS: Multiple views of the bony pelvis and hips bilaterally demonstrates a mildly comminuted and mildly displaced transcervical left femoral neck fracture. This appears mildly impacted, with at least 13 mm of proximal migration of the distal fracture fragment, and slight varus angulation. Left femoral head remains located in the acetabulum. Remaining portions of the bony pelvis and right hip are otherwise normal in appearance. Multiple pelvic phleboliths are noted. IMPRESSION: 1. Mildly comminuted, angulated and displaced  left-sided transcervical femoral neck fracture, as above. Electronically Signed   By: Trudie Reed M.D.   On: 01/01/2017 08:33      EKG: Independently reviewed.  Sinus rhythm No significant change since last tracing   Assessment/Plan Principal Problem:   Hip fracture, left Orthopedics, Dr. Victorino Dike consulted patient will be transferred to [facility] for surgery this afternoon NPO, Pain control including Vicodin, Robaxin as needed IV morphine is also available No other evidence of trauma, CT head negative Based on Revised cardiac risk index,low risk for surgery  Dog bite Will continue with Augmentin, patient has already received DTaP EDP decided to hold off on rabies vaccination as the dogs were assessed by animal department  Cocaine abuse Patient admits to cocaine use last night Counseling has been done Social work consult      Schizoaffective disorder, bipolar type (HCC)  Will continue home medications  DVT prophylaxis: *Lovenox post surgery     Code Status Orders Full code         consults called: Dr. Victorino Dike  Family Communication: Admission, patients condition and plan of  care including tests being ordered have been discussed with the patient  who indicates understanding and agree with the plan and Code Status  Admission status: inpatient    Disposition plan: Further plan will depend as patient's clinical course evolves and further radiologic and laboratory data become available. Likely home when stable   At the time of admission, it appears that the appropriate admission status for this patient is INPATIENT .Thisis judged to be reasonable and necessary in order to provide the required intensity of service to ensure the patient's safetygiven thepresenting symptoms, physical exam findings, and initial radiographic and laboratory data in the context of their chronic comorbidities.   Richarda Overlie MD [facility] Pager [phone]  If 7PM-7AM,  please contact night-coverage [email or web address] Password [email or web address]  01/01/2017, 10:30 AM

## 2017-01-01 NOTE — ED Notes (Signed)
PT aware of urine sample 

## 2017-01-01 NOTE — Discharge Instructions (Signed)
°Dr. Christop Hippert °Joint Replacement Specialist °[facility] °[address] °[place], [place] [address] °[phone] ° ° °TOTAL HIP REPLACEMENT POSTOPERATIVE DIRECTIONS ° ° ° °Hip Rehabilitation, Guidelines Following Surgery  ° °WEIGHT BEARING °Weight bearing as tolerated with assist device (walker, cane, etc) as directed, use it as long as suggested by your surgeon or therapist, typically at least 4-6 weeks. ° °The results of a hip operation are greatly improved after range of motion and muscle strengthening exercises. Follow all safety measures which are given to protect your hip. If any of these exercises cause increased pain or swelling in your joint, decrease the amount until you are comfortable again. Then slowly increase the exercises. Call your caregiver if you have problems or questions.  ° °HOME CARE INSTRUCTIONS  °Most of the following instructions are designed to prevent the dislocation of your new hip.  °Remove items at home which could result in a fall. This includes throw rugs or furniture in walking pathways.  °Continue medications as instructed at time of discharge. °· You may have some home medications which will be placed on hold until you complete the course of blood thinner medication. °· You may start showering once you are discharged home. Do not remove your dressing. °Do not put on socks or shoes without following the instructions of your caregivers.   °Sit on chairs with arms. Use the chair arms to help push yourself up when arising.  °Arrange for the use of a toilet seat elevator so you are not sitting low.  °· Walk with walker as instructed.  °You may resume a sexual relationship in one month or when given the OK by your caregiver.  °Use walker as long as suggested by your caregivers.  °You may put full weight on your legs and walk as much as is comfortable. °Avoid periods of inactivity such as sitting longer than an hour when not asleep. This helps prevent  blood clots.  °You may return to work once you are cleared by your surgeon.  °Do not drive a car for 6 weeks or until released by your surgeon.  °Do not drive while taking narcotics.  °Wear elastic stockings for two weeks following surgery during the day but you may remove then at night.  °Make sure you keep all of your appointments after your operation with all of your doctors and caregivers. You should call the office at the above phone number and make an appointment for approximately two weeks after the date of your surgery. °Please pick up a stool softener and laxative for home use as long as you are requiring pain medications. °· ICE to the affected hip every three hours for 30 minutes at a time and then as needed for pain and swelling. Continue to use ice on the hip for pain and swelling from surgery. You may notice swelling that will progress down to the foot and ankle.  This is normal after surgery.  Elevate the leg when you are not up walking on it.   °It is important for you to complete the blood thinner medication as prescribed by your doctor. °· Continue to use the breathing machine which will help keep your temperature down.  It is common for your temperature to cycle up and down following surgery, especially at night when you are not up moving around and exerting yourself.  The breathing machine keeps your lungs expanded and your temperature down. ° °RANGE OF MOTION AND STRENGTHENING EXERCISES  °These exercises are   designed to help you keep full movement of your hip joint. Follow your caregiver's or physical therapist's instructions. Perform all exercises about fifteen times, three times per day or as directed. Exercise both hips, even if you have had only one joint replacement. These exercises can be done on a training (exercise) mat, on the floor, on a table or on a bed. Use whatever works the best and is most comfortable for you. Use music or television while you are exercising so that the exercises  are a pleasant break in your day. This will make your life better with the exercises acting as a break in routine you can look forward to.  °Lying on your back, slowly slide your foot toward your buttocks, raising your knee up off the floor. Then slowly slide your foot back down until your leg is straight again.  °Lying on your back spread your legs as far apart as you can without causing discomfort.  °Lying on your side, raise your upper leg and foot straight up from the floor as far as is comfortable. Slowly lower the leg and repeat.  °Lying on your back, tighten up the muscle in the front of your thigh (quadriceps muscles). You can do this by keeping your leg straight and trying to raise your heel off the floor. This helps strengthen the largest muscle supporting your knee.  °Lying on your back, tighten up the muscles of your buttocks both with the legs straight and with the knee bent at a comfortable angle while keeping your heel on the floor.  ° °SKILLED REHAB INSTRUCTIONS: °If the patient is transferred to a skilled rehab facility following release from the hospital, a list of the current medications will be sent to the facility for the patient to continue.  When discharged from the skilled rehab facility, please have the facility set up the patient's Home Health Physical Therapy prior to being released. Also, the skilled facility will be responsible for providing the patient with their medications at time of release from the facility to include their pain medication and their blood thinner medication. If the patient is still at the rehab facility at time of the two week follow up appointment, the skilled rehab facility will also need to assist the patient in arranging follow up appointment in our office and any transportation needs. ° °MAKE SURE YOU:  °Understand these instructions.  °Will watch your condition.  °Will get help right away if you are not doing well or get worse. ° °Pick up stool softner and  laxative for home use following surgery while on pain medications. °Do not remove your dressing. °The dressing is waterproof--it is OK to take showers. °Continue to use ice for pain and swelling after surgery. °Do not use any lotions or creams on the incision until instructed by your surgeon. °Total Hip Protocol. ° ° °

## 2017-01-01 NOTE — ED Notes (Signed)
Care link here to pick up patient 

## 2017-01-01 NOTE — ED Notes (Signed)
Report called to [facility] RN 6N

## 2017-01-01 NOTE — Anesthesia Postprocedure Evaluation (Signed)
Anesthesia Post Note  Patient: Terry Potter  Procedure(s) Performed: Procedure(s) (LRB): TOTAL HIP ARTHROPLASTY ANTERIOR APPROACH (Left)     Patient location during evaluation: PACU Anesthesia Type: General Level of consciousness: awake Pain management: pain level controlled Vital Signs Assessment: post-procedure vital signs reviewed and stable Respiratory status: spontaneous breathing Cardiovascular status: stable Anesthetic complications: no    Last Vitals:  Vitals:   01/01/17 2030 01/01/17 2046  BP:  121/61  Pulse:  67  Resp:  17  Temp: 36.7 C (!) 36.3 C    Last Pain:  Vitals:   01/01/17 2046  TempSrc: Oral  PainSc:                  Lambert Jeanty

## 2017-01-01 NOTE — ED Triage Notes (Signed)
Patient reports that she was bite by a dog on her right upper leg that is bleeding. Patient states she was also bit on her ankles and right arm with no puncture wounds there. Patient fell states she did not hit her head but did lose consciousness.

## 2017-01-01 NOTE — Anesthesia Procedure Notes (Signed)
Procedure Name: Intubation Date/Time: 01/01/2017 5:14 PM Performed by: Salli Quarry Anahlia Iseminger Pre-anesthesia Checklist: Patient identified, Emergency Drugs available, Suction available and Patient being monitored Patient Re-evaluated:Patient Re-evaluated prior to induction Oxygen Delivery Method: Circle System Utilized Preoxygenation: Pre-oxygenation with 100% oxygen Induction Type: IV induction Ventilation: Mask ventilation without difficulty Laryngoscope Size: Mac and 3 Grade View: Grade II Tube type: Oral Tube size: 7.0 mm Number of attempts: 1 Airway Equipment and Method: Stylet Placement Confirmation: ETT inserted through vocal cords under direct vision,  positive ETCO2 and breath sounds checked- equal and bilateral Secured at: 21 cm Tube secured with: Tape Dental Injury: Teeth and Oropharynx as per pre-operative assessment

## 2017-01-02 DIAGNOSIS — S72009A Fracture of unspecified part of neck of unspecified femur, initial encounter for closed fracture: Secondary | ICD-10-CM

## 2017-01-02 LAB — CBC WITH DIFFERENTIAL/PLATELET
BASOS ABS: 0 10*3/uL (ref 0.0–0.1)
BASOS PCT: 0 %
Eosinophils Absolute: 0 10*3/uL (ref 0.0–0.7)
Eosinophils Relative: 0 %
HEMATOCRIT: 26.5 % — AB (ref 36.0–46.0)
HEMOGLOBIN: 9.2 g/dL — AB (ref 12.0–15.0)
LYMPHS PCT: 29 %
Lymphs Abs: 2.1 10*3/uL (ref 0.7–4.0)
MCH: 31.7 pg (ref 26.0–34.0)
MCHC: 34.7 g/dL (ref 30.0–36.0)
MCV: 91.4 fL (ref 78.0–100.0)
Monocytes Absolute: 0.6 10*3/uL (ref 0.1–1.0)
Monocytes Relative: 8 %
NEUTROS ABS: 4.7 10*3/uL (ref 1.7–7.7)
NEUTROS PCT: 63 %
Platelets: 116 10*3/uL — ABNORMAL LOW (ref 150–400)
RBC: 2.9 MIL/uL — AB (ref 3.87–5.11)
RDW: 14.8 % (ref 11.5–15.5)
WBC: 7.4 10*3/uL (ref 4.0–10.5)

## 2017-01-02 LAB — COMPREHENSIVE METABOLIC PANEL
ALBUMIN: 2.9 g/dL — AB (ref 3.5–5.0)
ALT: 14 U/L (ref 14–54)
AST: 31 U/L (ref 15–41)
Alkaline Phosphatase: 49 U/L (ref 38–126)
Anion gap: 7 (ref 5–15)
BILIRUBIN TOTAL: 0.5 mg/dL (ref 0.3–1.2)
BUN: 11 mg/dL (ref 6–20)
CO2: 19 mmol/L — ABNORMAL LOW (ref 22–32)
CREATININE: 0.76 mg/dL (ref 0.44–1.00)
Calcium: 7.9 mg/dL — ABNORMAL LOW (ref 8.9–10.3)
Chloride: 108 mmol/L (ref 101–111)
GFR calc Af Amer: >60 mL/min (ref >60)
GLUCOSE: 193 mg/dL — AB (ref 65–99)
POTASSIUM: 4.9 mmol/L (ref 3.5–5.1)
Sodium: 134 mmol/L — ABNORMAL LOW (ref 135–145)
TOTAL PROTEIN: 5 g/dL — AB (ref 6.5–8.1)

## 2017-01-02 LAB — CBC
HCT: 26.1 % — ABNORMAL LOW (ref 36.0–46.0)
Hemoglobin: 8.8 g/dL — ABNORMAL LOW (ref 12.0–15.0)
MCH: 31 pg (ref 26.0–34.0)
MCHC: 33.7 g/dL (ref 30.0–36.0)
MCV: 91.9 fL (ref 78.0–100.0)
PLATELETS: 120 10*3/uL — AB (ref 150–400)
RBC: 2.84 MIL/uL — ABNORMAL LOW (ref 3.87–5.11)
RDW: 14.4 % (ref 11.5–15.5)
WBC: 5.7 10*3/uL (ref 4.0–10.5)

## 2017-01-02 MED ORDER — ACETAMINOPHEN 325 MG PO TABS: 650.0000 mg | ORAL_TABLET | Freq: Once | ORAL | Status: DC

## 2017-01-02 MED ORDER — SODIUM CHLORIDE 0.9 % IV SOLN: Freq: Once | INTRAVENOUS | Status: DC

## 2017-01-02 MED ORDER — SODIUM CHLORIDE 0.9 % IV BOLUS (SEPSIS)
500.0000 mL | Freq: Once | INTRAVENOUS | Status: AC
  Administered 2017-01-02: 500 mL via INTRAVENOUS

## 2017-01-02 MED ORDER — SODIUM CHLORIDE 0.9 % IV SOLN
INTRAVENOUS | Status: DC
  Administered 2017-01-02: 21:00:00 via INTRAVENOUS

## 2017-01-02 MED ORDER — RABIES VACCINE, PCEC IM SUSR
1.0000 mL | Freq: Once | INTRAMUSCULAR | Status: AC
  Administered 2017-01-02: 1 mL via INTRAMUSCULAR
  Filled 2017-01-02: qty 1

## 2017-01-02 MED ORDER — DIPHENHYDRAMINE HCL 25 MG PO CAPS: 25.0000 mg | ORAL_CAPSULE | Freq: Once | ORAL | Status: DC

## 2017-01-02 MED ORDER — FUROSEMIDE 10 MG/ML IJ SOLN: 20.0000 mg | Freq: Once | INTRAMUSCULAR | Status: DC

## 2017-01-02 NOTE — Progress Notes (Signed)
PROGRESS NOTE    Terry Potter  JAS:[id number] DOB: December 27, 1955 DOA: 01/01/2017 PCP: Erlinda Hong, MD  Outpatient Specialists:     Brief Narrative:  34 ? 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 per [facility]-appreciate their input into   Anticoagulation post-op--ASA 325 bid  Weight bearing tolerance for therapy services-WBAT  Wound care  Pain management  Follow-up services required   Anemia of both hemodulition and acute expected blood loss post op  Normocytic  Follow trends  transfuse if less than 7  Hypokalemia on admit 3.4  Replaced with IV, now 4.9  Stop fluids with K   Bipolar  On multiple meds  Cont vistaril 25  Bid, Risperdal 1 bid, trileptal 300 tid  Dog bite  Give rabies vaccine NOW  Augmentin 10 days  imparied gluc tolerance  Get A1c  Needs OP discussion of borderline--will follow  Cocaine use  ? Willing to quit  Started ~ 10 yrs ago  encourgaed her to think of cessation   hypotension  Constitutional?  Get orthostatics in bed   See anemia above   DVT-loenox Non tele inpt Patient alone bedside Needs ot pt eval priot to dispo planning  Consultants:    ortho  Procedures:   Hip repair 7/20  Antimicrobials:   augmentin 7/20    Subjective:  Awake pleasant  Pain 3-4/10 Eating No cp No stool Not OOB yet   Objective: Vitals:   01/01/17 2030 01/01/17 2046 01/02/17 0215 01/02/17 0622  BP:  121/61 (!) 92/55 (!) 92/49  Pulse:  67 86 71    Resp:  17 17 18   Temp: 98 F (36.7 C) (!) 97.4 F (36.3 C) 98.2 F (36.8 C) 98 F (36.7 C)  TempSrc:  Oral Oral Oral  SpO2:  96% 98% 99%  Weight:      Height:        Intake/Output Summary (Last 24 hours) at 01/02/17 0733 Last data filed at 01/02/17 01/04/17  Gross per 24 hour  Intake             3835 ml  Output             1950 ml  Net             1885 ml   Filed Weights   01/01/17 1330  Weight: 64.7 kg (142 lb 11.2 oz)    Examination:  Flat mild pallor No ict No jvd Chest clesr Bite wounds clean--one dressing soaked through No le edema   Data Reviewed: I have personally reviewed following labs and imaging studies  CBC:  Recent Labs Lab 01/01/17 0746 01/02/17 0423  WBC 7.6 5.7  NEUTROABS 5.2  --   HGB 13.4 8.8*  HCT 38.6 26.1*  MCV 89.4 91.9  PLT 171 120*   Basic Metabolic Panel:  Recent Labs Lab 01/01/17 0746 01/02/17 0423  NA 137 134*  K 3.4* 4.9  CL 101 108  CO2 26 19*  GLUCOSE 101* 193*  BUN 16  11  CREATININE 0.94 0.76  CALCIUM 9.1 7.9*   GFR: Estimated Creatinine Clearance: 63.8 mL/min (by C-G formula based on SCr of 0.76 mg/dL). Liver Function Tests:  Recent Labs Lab 01/01/17 0746 01/02/17 0423  AST 28 31  ALT 20 14  ALKPHOS 79 49  BILITOT 0.4 0.5  PROT 7.1 5.0*  ALBUMIN 3.8 2.9*   No results for input(s): LIPASE, AMYLASE in the last 168 hours. No results for input(s): AMMONIA in the last 168 hours. Coagulation Profile: No results for input(s): INR, PROTIME in the last 168 hours. Cardiac Enzymes: No results for input(s): CKTOTAL, CKMB, CKMBINDEX, TROPONINI in the last 168 hours. BNP (last 3 results) No results for input(s): PROBNP in the last 8760 hours. HbA1C: No results for input(s): HGBA1C in the last 72 hours. CBG: No results for input(s): GLUCAP in the last 168 hours. Lipid Profile: No results for input(s): CHOL, HDL, LDLCALC, TRIG, CHOLHDL, LDLDIRECT in the last 72 hours. Thyroid Function Tests: No results for  input(s): TSH, T4TOTAL, FREET4, T3FREE, THYROIDAB in the last 72 hours. Anemia Panel: No results for input(s): VITAMINB12, FOLATE, FERRITIN, TIBC, IRON, RETICCTPCT in the last 72 hours. Urine analysis:    Component Value Date/Time   COLORURINE YELLOW 01/01/2017 0940   APPEARANCEUR CLEAR 01/01/2017 0940   LABSPEC 1.010 01/01/2017 0940   PHURINE 6.0 01/01/2017 0940   GLUCOSEU NEGATIVE 01/01/2017 0940   HGBUR NEGATIVE 01/01/2017 0940   BILIRUBINUR NEGATIVE 01/01/2017 0940   KETONESUR NEGATIVE 01/01/2017 0940   PROTEINUR NEGATIVE 01/01/2017 0940   UROBILINOGEN 1.0 12/16/2014 1325   NITRITE NEGATIVE 01/01/2017 0940   LEUKOCYTESUR NEGATIVE 01/01/2017 0940   Sepsis Labs: @LABRCNTIP (procalcitonin:4,lacticidven:4)  ) Recent Results (from the past 240 hour(s))  Surgical PCR screen     Status: None   Collection Time: 01/01/17  3:29 PM  Result Value Ref Range Status   MRSA, PCR NEGATIVE NEGATIVE Final   Staphylococcus aureus NEGATIVE NEGATIVE Final    Comment:        The Xpert SA Assay (FDA approved for NASAL specimens in patients over 61 years of age), is one component of a comprehensive surveillance program.  Test performance has been validated by [facility] for patients greater than or equal to 36 year old. It is not intended to diagnose infection nor to guide or monitor treatment.          Radiology Studies: Dg Chest 2 View  Result Date: 01/01/2017 CLINICAL DATA:  61 year old female with history of attack by a canine during which she fell to the ground. Body pain. EXAM: CHEST  2 VIEW COMPARISON:  Chest x-ray a 12/16/2014. FINDINGS: Lung volumes are normal. No consolidative airspace disease. No pleural effusions. No pneumothorax. No pulmonary nodule or mass noted. Pulmonary vasculature and the cardiomediastinal silhouette are within normal limits. Atherosclerosis in the thoracic aorta. IMPRESSION: 1.  No radiographic evidence of acute cardiopulmonary disease. 2. Aortic  atherosclerosis. Aortic Atherosclerosis (ICD10-I70.0). Electronically Signed   By: Trudie Reed M.D.   On: 01/01/2017 08:30   Ct Head Wo Contrast  Result Date: 01/01/2017 CLINICAL DATA:  Recent fall, possible head injury EXAM: CT HEAD WITHOUT CONTRAST TECHNIQUE: Contiguous axial images were obtained from the base of the skull through the vertex without intravenous contrast. COMPARISON:  None available FINDINGS: Brain: Minor patchy chronic white matter microvascular ischemic changes throughout both cerebral hemispheres. No acute intracranial hemorrhage, mass lesion, new infarction, midline shift, herniation, hydrocephalus, or extra-axial fluid collection. No focal mass effect or edema. Cisterns are patent.  No cerebellar abnormality. Vascular: Intracranial atherosclerosis at the skullbase. Skull: Normal. Negative for fracture or focal lesion. Sinuses/Orbits: No acute finding. Other: None. IMPRESSION: Minor chronic white matter microvascular ischemic changes. No acute intracranial abnormality by noncontrast CT. Electronically Signed   By: Judie Petit.  Shick M.D.   On: 01/01/2017 08:34   Pelvis Portable  Result Date: 01/01/2017 CLINICAL DATA:  Followup left hip arthroplasty. EXAM: PORTABLE PELVIS 1-2 VIEWS COMPARISON:  Earlier same day FINDINGS: Components well positioned. No radiographically detectable complication. Remainder the pelvis appears normal. IMPRESSION: Left hip arthroplasty.  No detectable complication. Electronically Signed   By: Paulina Fusi M.D.   On: 01/01/2017 20:39   Dg Knee Complete 4 Views Right  Result Date: 01/01/2017 CLINICAL DATA:  Fall with right knee pain.  Initial encounter. EXAM: RIGHT KNEE - COMPLETE 4+ VIEW COMPARISON:  None. FINDINGS: Question anterior soft tissue injury. No opaque foreign body. No evidence of fracture, dislocation, or joint effusion. Possible patellofemoral joint narrowing but no notable spurring. IMPRESSION: No acute osseous finding or joint effusion.  Electronically Signed   By: Marnee Spring M.D.   On: 01/01/2017 08:30   Dg C-arm 1-60 Min  Result Date: 01/01/2017 CLINICAL DATA:  Left hip arthroplasty. EXAM: DG C-ARM 61-120 MIN; OPERATIVE LEFT HIP WITH PELVIS COMPARISON:  01/01/2017. FINDINGS: Five intraoperative spot fluoro films were obtained. Initial film show a femoral sizing device with acetabular cup in place. Final 2 images document placement of the femoral component. No evidence for immediate hardware complications. IMPRESSION: status post left total hip arthroplasty without evidence for immediate hardware complications. Electronically Signed   By: Kennith Center M.D.   On: 01/01/2017 19:06   Dg Hip Operative Unilat W Or W/o Pelvis Left  Result Date: 01/01/2017 CLINICAL DATA:  Left hip arthroplasty. EXAM: DG C-ARM 61-120 MIN; OPERATIVE LEFT HIP WITH PELVIS COMPARISON:  01/01/2017. FINDINGS: Five intraoperative spot fluoro films were obtained. Initial film show a femoral sizing device with acetabular cup in place. Final 2 images document placement of the femoral component. No evidence for immediate hardware complications. IMPRESSION: status post left total hip arthroplasty without evidence for immediate hardware complications. Electronically Signed   By: Kennith Center M.D.   On: 01/01/2017 19:06   Dg Hip Unilat W Or Wo Pelvis 2-3 Views Left  Result Date: 01/01/2017 CLINICAL DATA:  61 year old female with history of attack by a canine this morning, during which she fell to the ground. Left hip and right knee pain. EXAM: DG HIP (WITH OR WITHOUT PELVIS) 2-3V LEFT COMPARISON:  None. FINDINGS: Multiple views of the bony pelvis and hips bilaterally demonstrates a mildly comminuted and mildly displaced transcervical left femoral neck fracture. This appears mildly impacted, with at least 13 mm of proximal migration of the distal fracture fragment, and slight varus angulation. Left femoral head remains located in the acetabulum. Remaining portions of  the bony pelvis and right hip are otherwise normal in appearance. Multiple pelvic phleboliths are noted. IMPRESSION: 1. Mildly comminuted, angulated and displaced left-sided transcervical femoral neck fracture, as above. Electronically Signed   By: Trudie Reed M.D.   On: 01/01/2017 08:33        Scheduled Meds: . amoxicillin-clavulanate  1 tablet Oral Q12H  . aspirin EC  325 mg Oral BID WC  . docusate sodium  100 mg Oral BID  . gabapentin  300 mg Oral TID  . HYDROmorphone      . Oxcarbazepine  300 mg Oral TID  . risperiDONE  1 mg Oral  BID  . senna  1 tablet Oral BID   Continuous Infusions: . 0.9 % NaCl with KCl 40 mEq / L 75 mL/hr (01/01/17 1438)  . methocarbamol (ROBAXIN)  IV    . sodium chloride       LOS: 1 day    Time spent: 64    Pleas Koch, MD [facility] ([facility]   If 7PM-7AM, please contact night-coverage [email or web address] Password [facility] 01/02/2017, 7:33 AM

## 2017-01-02 NOTE — Progress Notes (Addendum)
borderline orthostatic but dizzy on stanbd Hold blood for now Reassess trednsa in am Minimize sedating meds and robaxin  Pleas Koch, MD [facility] [phone]

## 2017-01-02 NOTE — Evaluation (Signed)
Physical Therapy Evaluation Patient Details Name: Terry Potter MRN: [id number] DOB: 1956/03/30 Today's Date: 01/02/2017   History of Present Illness  Pt is a 61 y.o. female who presented to the ED with L hip pain  and bite injuries to B lower legs following attack by 3 dogs with a fall. Pt found to have displaced L femoral neck fracture. Pt now s/p L total hip arthroplasty anterior approach. PMH significant for arthritis, bipolar disorder, ectopic pregnancy, and polysubstance abuse.  Clinical Impression  Patient presents with problems listed below.  Will benefit from acute PT to maximize functional mobility prior to d/c.  Patient was independent pta, and now requires mod assist with mobility.  Recommend ST-SNF at d/c for continued therapy to allow patient to return home safely.    Follow Up Recommendations SNF;Supervision for mobility/OOB    Equipment Recommendations  Rolling walker with 5" wheels;3in1 (PT)    Recommendations for Other Services       Precautions / Restrictions Precautions Precautions: Fall Restrictions Weight Bearing Restrictions: Yes LLE Weight Bearing: Weight bearing as tolerated      Mobility  Bed Mobility Overal bed mobility: Needs Assistance Bed Mobility: Supine to Sit;Sit to Supine     Supine to sit: Min guard;HOB elevated Sit to supine: Min assist   General bed mobility comments: Patient able to move to Lt side of bed with min guard assist and increased time.  Assist to bring LLE onto bed to return to supine.  Transfers Overall transfer level: Needs assistance Equipment used: Rolling walker (2 wheeled) Transfers: Sit to/from [employer] Sit to Stand: Mod assist Stand pivot transfers: Min assist       General transfer comment: Verbal cues for hand placement and technique.  Assist to rise to standing.  Once upright, patient able to maintain balance.  At end of session, patient performed stand-pivot transfers x2.  Patient was  able to clean herself for pericare.  Ambulation/Gait Ambulation/Gait assistance: Min assist Ambulation Distance (Feet): 4 Feet (2' forward and 2' backward) Assistive device: Rolling walker (2 wheeled) Gait Pattern/deviations: Step-to pattern;Decreased stance time - left;Decreased step length - right;Decreased stride length;Decreased weight shift to left;Shuffle;Antalgic Gait velocity: decreased Gait velocity interpretation: Below normal speed for age/gender General Gait Details: Patient with very slow, antalgic gait pattern.  Was able to put some weight on LLE by end of session.  Stairs            Wheelchair Mobility    Modified Rankin (Stroke Patients Only)       Balance Overall balance assessment: Needs assistance Sitting-balance support: No upper extremity supported;Feet supported Sitting balance-Leahy Scale: Good (Patient reaching for shoes while sitting on BSC)     Standing balance support: Bilateral upper extremity supported;During functional activity Standing balance-Leahy Scale: Poor                               Pertinent Vitals/Pain Pain Assessment: 0-10 Pain Score: 5  Pain Location: Lt thigh and stomach Pain Descriptors / Indicators: Aching;Sore Pain Intervention(s): Monitored during session;Repositioned;Patient requesting pain meds-RN notified;Ice applied    Home Living Family/patient expects to be discharged to:: Private residence Living Arrangements: Non-relatives/Friends Teacher, adult education) Available Help at Discharge:  (Patient reports no assist available) Type of Home: House Home Access: Stairs to enter Entrance Stairs-Rails: None Entrance Stairs-Number of Steps: 2 Home Layout: One level Home Equipment: None      Prior Function Level of Independence: Independent  Comments: Does not drive     Hand Dominance   Dominant Hand: Right    Extremity/Trunk Assessment   Upper Extremity Assessment Upper Extremity Assessment:  Overall WFL for tasks assessed    Lower Extremity Assessment Lower Extremity Assessment: RLE deficits/detail;LLE deficits/detail RLE Deficits / Details: Multiple bite wounds from dogs. LLE Deficits / Details: Decreased strength and AROM post-op.    Cervical / Trunk Assessment Cervical / Trunk Assessment: Normal  Communication   Communication: No difficulties  Cognition Arousal/Alertness: Awake/alert Behavior During Therapy: WFL for tasks assessed/performed Overall Cognitive Status: Within Functional Limits for tasks assessed                                        General Comments      Exercises     Assessment/Plan    PT Assessment Patient needs continued PT services  PT Problem List Decreased strength;Decreased activity tolerance;Decreased balance;Decreased mobility;Decreased knowledge of use of DME;Pain       PT Treatment Interventions DME instruction;Gait training;Functional mobility training;Therapeutic activities;Therapeutic exercise;Patient/family education    PT Goals (Current goals can be found in the Care Plan section)  Acute Rehab PT Goals Patient Stated Goal: To go to rehab so can go home safely PT Goal Formulation: With patient Time For Goal Achievement: 01/09/17 Potential to Achieve Goals: Good    Frequency 7X/week   Barriers to discharge Decreased caregiver support No assist at home    Co-evaluation               AM-PAC PT "6 Clicks" Daily Activity  Outcome Measure Difficulty turning over in bed (including adjusting bedclothes, sheets and blankets)?: None Difficulty moving from lying on back to sitting on the side of the bed? : A Little Difficulty sitting down on and standing up from a chair with arms (e.g., wheelchair, bedside commode, etc,.)?: Total Help needed moving to and from a bed to chair (including a wheelchair)?: A Little Help needed walking in hospital room?: A Little Help needed climbing 3-5 steps with a railing? :  A Lot 6 Click Score: 16    End of Session Equipment Utilized During Treatment: Gait belt Activity Tolerance: Patient tolerated treatment well Patient left: in bed;with call bell/phone within reach;with bed alarm set;with SCD's reapplied Nurse Communication: Mobility status;Patient requests pain meds PT Visit Diagnosis: Unsteadiness on feet (R26.81);Other abnormalities of gait and mobility (R26.89);History of falling (Z91.81);Pain Pain - Right/Left: Left Pain - part of body: Leg    Time: 1324-4010 PT Time Calculation (min) (ACUTE ONLY): 41 min   Charges:   PT Evaluation $PT Eval Moderate Complexity: 1 Procedure PT Treatments $Gait Training: 8-22 mins $Therapeutic Activity: 8-22 mins   PT G Codes:        Durenda Hurt. Renaldo Fiddler, [facility] Acute Rehab Services Pager [phone]   Vena Austria 01/02/2017, 8:43 PM

## 2017-01-02 NOTE — Progress Notes (Signed)
Subjective: 1 Day Post-Op Procedure(s) (LRB): TOTAL HIP ARTHROPLASTY ANTERIOR APPROACH (Left) Patient reports pain as mild.  No other c/o.  Objective: Vital signs in last 24 hours: Temp:  [97.4 F (36.3 C)-99.1 F (37.3 C)] 98 F (36.7 C) (07/21 0622) Pulse Rate:  [64-95] 71 (07/21 0622) Resp:  [13-19] 18 (07/21 0622) BP: (92-143)/(49-74) 92/49 (07/21 0622) SpO2:  [93 %-100 %] 99 % (07/21 0622) Weight:  [64.7 kg (142 lb 11.2 oz)] 64.7 kg (142 lb 11.2 oz) (07/20 1330)  Intake/Output from previous day: 07/20 0701 - 07/21 0700 In: 3835 [P.O.:360; I.V.:1975; IV Piggyback:1500] Out: 1950 [Urine:750; Blood:1200] Intake/Output this shift: No intake/output data recorded.   Recent Labs  01/01/17 0746 01/02/17 0423  HGB 13.4 8.8*    Recent Labs  01/01/17 0746 01/02/17 0423  WBC 7.6 5.7  RBC 4.32 2.84*  HCT 38.6 26.1*  PLT 171 120*    Recent Labs  01/01/17 0746 01/02/17 0423  NA 137 134*  K 3.4* 4.9  CL 101 108  CO2 26 19*  BUN 16 11  CREATININE 0.94 0.76  GLUCOSE 101* 193*  CALCIUM 9.1 7.9*   No results for input(s): LABPT, INR in the last 72 hours.  Neurologically intact ABD soft Neurovascular intact Sensation intact distally Intact pulses distally Dorsiflexion/Plantar flexion intact Incision: dressing C/D/I and no drainage No cellulitis present Compartment soft no sign of DVT  Assessment/Plan: 1 Day Post-Op Procedure(s) (LRB): TOTAL HIP ARTHROPLASTY ANTERIOR APPROACH (Left) Advance diet Up with therapy D/C IV fluids WBAT LLE ASA for DVT ppx  Terry Potter M. 01/02/2017, 8:47 AM

## 2017-01-02 NOTE — Evaluation (Signed)
Occupational Therapy Evaluation Patient Details Name: Terry Potter MRN: [id number] DOB: 1955/09/12 Today's Date: 01/02/2017    History of Present Illness Pt is a 61 y.o. female who presented to the ED with L hip pain  and bite injuries to B lower legs following attack by 3 dogs with a fall. Pt found to have displaced L femoral neck fracture. Pt now s/p L total hip arthroplasty anterior approach. PMH significant for arthritis, bipolar disorder, ectopic pregnancy, and polysubstance abuse.   Clinical Impression   PTA, pt was independent with ADL and functional mobility. She currently requires mod assist for LB ADL and mod assist +2 for sit<>stand transfers in preparation for toilet transfers. Pt limited this session due to low BP and RN present during evaluation (see ADL comments for further details). Pt will not have assistance post-acute D/C and demonstrates significant functional decline from PLOF. Feel pt would best benefit from short-term SNF placement for continued rehabilitation in order to maximize safety and independence with ADL participation. Will continue to follow acutely.     Follow Up Recommendations  SNF;Supervision/Assistance - 24 hour    Equipment Recommendations  Other (comment) (TBD at next venue of care)    Recommendations for Other Services       Precautions / Restrictions Precautions Precautions: Fall Restrictions Weight Bearing Restrictions: Yes LLE Weight Bearing: Weight bearing as tolerated      Mobility Bed Mobility Overal bed mobility: Needs Assistance Bed Mobility: Supine to Sit;Sit to Supine     Supine to sit: Mod assist Sit to supine: Min assist   General bed mobility comments: Assist to manage B LE into and out of bed.   Transfers Overall transfer level: Needs assistance Equipment used: Rolling walker (2 wheeled) Transfers: Sit to/from Stand Sit to Stand: Mod assist;+2 physical assistance         General transfer comment: Mod +2  lifting assist.     Balance Overall balance assessment: Needs assistance Sitting-balance support: No upper extremity supported;Feet supported Sitting balance-Leahy Scale: Fair     Standing balance support: Bilateral upper extremity supported;During functional activity Standing balance-Leahy Scale: Poor                             ADL either performed or assessed with clinical judgement   ADL Overall ADL's : Needs assistance/impaired Eating/Feeding: Set up;Bed level   Grooming: Set up;Bed level   Upper Body Bathing: Supervision/ safety;Sitting   Lower Body Bathing: Moderate assistance;Sit to/from stand   Upper Body Dressing : Supervision/safety;Sitting   Lower Body Dressing: Moderate assistance;Sit to/from stand   Toilet Transfer: Moderate assistance;+2 for physical assistance Toilet Transfer Details (indicate cue type and reason): Able to complete only sit<>stand in preparation for toilet transfers this session due to pt with low BP.  Toileting- Clothing Manipulation and Hygiene: Moderate assistance;Sit to/from stand         General ADL Comments: Pt limited by low BP this session as follows: 88/49 supine, 92/47 seated, 81/58 standing (RN present and requesting orthostatic blood pressures).     Vision   Vision Assessment?: No apparent visual deficits     Perception     Praxis      Pertinent Vitals/Pain Pain Assessment: 0-10 Pain Score: 5      Hand Dominance Right   Extremity/Trunk Assessment Upper Extremity Assessment Upper Extremity Assessment: Overall WFL for tasks assessed   Lower Extremity Assessment Lower Extremity Assessment: RLE deficits/detail;LLE deficits/detail RLE Deficits /  Details: Multiple bite wounds from dogs. Reports pain in R ankle when standing and RN notified.  LLE Deficits / Details: Decreased strength and AROM with post-operative pain.        Communication Communication Communication: No difficulties   Cognition  Arousal/Alertness: Awake/alert Behavior During Therapy: WFL for tasks assessed/performed Overall Cognitive Status: Within Functional Limits for tasks assessed                                     General Comments  Nurse tech and RN present during session and RN requesting orthostatic BP measurements.     Exercises     Shoulder Instructions      Home Living Family/patient expects to be discharged to:: Private residence Living Arrangements: Non-relatives/Friends (landlord)   Type of Home: House Home Access: Stairs to enter Secretary/administrator of Steps: 2 Entrance Stairs-Rails: None Home Layout: One level     Bathroom Shower/Tub: Chief Strategy Officer: Standard Bathroom Accessibility: Yes How Accessible: Accessible via walker Home Equipment: None          Prior Functioning/Environment Level of Independence: Independent        Comments: Does not drive        OT Problem List: Decreased strength;Decreased activity tolerance;Impaired balance (sitting and/or standing);Decreased safety awareness;Decreased knowledge of use of DME or AE;Decreased knowledge of precautions;Pain      OT Treatment/Interventions: Self-care/ADL training;Therapeutic exercise;Energy conservation;DME and/or AE instruction;Therapeutic activities;Patient/family education;Balance training    OT Goals(Current goals can be found in the care plan section) Acute Rehab OT Goals Patient Stated Goal: to get back to riding the bus OT Goal Formulation: With patient Time For Goal Achievement: 01/16/17 Potential to Achieve Goals: Good ADL Goals Pt Will Perform Grooming: with min guard assist;standing Pt Will Perform Lower Body Bathing: with min guard assist;sit to/from stand Pt Will Perform Lower Body Dressing: with min guard assist;sit to/from stand Pt Will Transfer to Toilet: with min guard assist;ambulating;bedside commode Pt Will Perform Toileting - Clothing Manipulation and  hygiene: with min guard assist;sit to/from stand Pt Will Perform Tub/Shower Transfer: Tub transfer;with min assist;ambulating;rolling walker;shower seat  OT Frequency: Min 2X/week   Barriers to D/C:            Co-evaluation              AM-PAC PT "6 Clicks" Daily Activity     Outcome Measure Help from another person eating meals?: A Little Help from another person taking care of personal grooming?: A Little Help from another person toileting, which includes using toliet, bedpan, or urinal?: A Lot Help from another person bathing (including washing, rinsing, drying)?: A Lot Help from another person to put on and taking off regular upper body clothing?: A Little Help from another person to put on and taking off regular lower body clothing?: A Lot 6 Click Score: 15   End of Session Equipment Utilized During Treatment: Rolling walker;Gait belt Nurse Communication: Mobility status (BP)  Activity Tolerance: Treatment limited secondary to medical complications (Comment) (low BP) Patient left: in bed;with call bell/phone within reach;with nursing/sitter in room  OT Visit Diagnosis: Other abnormalities of gait and mobility (R26.89);Pain Pain - Right/Left: Left (bilateral) Pain - part of body: Leg;Hip                Time: 0737-1062 OT Time Calculation (min): 34 min Charges:  OT General Charges $OT Visit: 1 Procedure OT  Evaluation $OT Eval Moderate Complexity: 1 Procedure OT Treatments $Self Care/Home Management : 8-22 mins G-Codes:     Doristine Section, MS OTR/L  Pager: [phone]   Hanzel Pizzo A Jerico Grisso 01/02/2017, 1:02 PM

## 2017-01-03 LAB — BASIC METABOLIC PANEL
Anion gap: 4 — ABNORMAL LOW (ref 5–15)
BUN: 8 mg/dL (ref 6–20)
CHLORIDE: 109 mmol/L (ref 101–111)
CO2: 25 mmol/L (ref 22–32)
Calcium: 8 mg/dL — ABNORMAL LOW (ref 8.9–10.3)
Creatinine, Ser: 0.73 mg/dL (ref 0.44–1.00)
GFR calc non Af Amer: >60 mL/min (ref >60)
Glucose, Bld: 96 mg/dL (ref 65–99)
POTASSIUM: 4.3 mmol/L (ref 3.5–5.1)
SODIUM: 138 mmol/L (ref 135–145)

## 2017-01-03 LAB — CBC
HCT: 23.7 % — ABNORMAL LOW (ref 36.0–46.0)
Hemoglobin: 8.1 g/dL — ABNORMAL LOW (ref 12.0–15.0)
MCH: 31 pg (ref 26.0–34.0)
MCHC: 34.2 g/dL (ref 30.0–36.0)
MCV: 90.8 fL (ref 78.0–100.0)
Platelets: 122 10*3/uL — ABNORMAL LOW (ref 150–400)
RBC: 2.61 MIL/uL — AB (ref 3.87–5.11)
RDW: 14.7 % (ref 11.5–15.5)
WBC: 6.9 10*3/uL (ref 4.0–10.5)

## 2017-01-03 MED ORDER — HYDROCODONE-ACETAMINOPHEN 5-325 MG PO TABS: 1.0000 | ORAL_TABLET | Freq: Four times a day (QID) | ORAL | 0 refills | Status: DC | PRN

## 2017-01-03 MED ORDER — ASPIRIN 325 MG PO TBEC: 325.0000 mg | DELAYED_RELEASE_TABLET | Freq: Two times a day (BID) | ORAL | 0 refills | Status: AC

## 2017-01-03 NOTE — Clinical Social Work Note (Addendum)
Clinical Social Work Assessment  Patient Details  Name: Terry Potter MRN: [id number] Date of Birth: June 19, 1955  Date of referral:  01/03/17               Reason for consult:  Discharge Planning                Permission sought to share information with:  Family Supports, Customer service manager Permission granted to share information::  Yes, Verbal Permission Granted  Name::        Agency::  SNFs  Relationship::     Contact Information:     Housing/Transportation Living arrangements for the past 2 months:  Single Family Home Source of Information:  Patient Patient Interpreter Needed:  None Criminal Activity/Legal Involvement Pertinent to Current Situation/Hospitalization:  No - Comment as needed Significant Relationships:  Other Family Members Lives with:  Self Do you feel safe going back to the place where you live?  Yes Need for family participation in patient care:  No (Coment)  Care giving concerns:  No care giving concerns identified.    Social Worker assessment / plan:  CSW met with pt at bedside to address consult for new SNF. CSW introduced self and explained role of social work. Pt from home and lives alone. P/T is recommending STR at SNF. CSW provided SNF listing for review. Pt agreeable to SNF for STR. Pt agreeable for CSW to fax out to [facility]. Pt is polysubstance user and diagnosed with schizoaffective disorder, bi-polar type. Pt already updated family and did not need CSW to contact anyone additional.   CSW will send FL-2 to SNFs and follow up on potential bed offer. Pt will be manual review for [facility]. CSW will continue to follow.   Employment status:  Disabled (Comment on whether or not currently receiving Disability) Insurance information:  Medicare PT Recommendations:  [place] / Referral to community resources:  [place]  Patient/Family's Response to care:  Pt appreciative of CSW support and  guidance.   Patient/Family's Understanding of and Emotional Response to Diagnosis, Current Treatment, and Prognosis:  Pt expresses some understanding of medical state.  Emotional Assessment Appearance:  Appears stated age Attitude/Demeanor/Rapport:  Other (Appropriate) Affect (typically observed):  Accepting, Adaptable Orientation:  Oriented to Self, Oriented to Place, Oriented to  Time, Oriented to Situation Alcohol / Substance use:  Illicit Drugs Psych involvement (Current and /or in the community):  No (Comment)  Discharge Needs  Concerns to be addressed:  Discharge Planning Concerns, Care Coordination Readmission within the last 30 days:  No Current discharge risk:  Lives alone, Dependent with Mobility Barriers to Discharge:  Continued Medical Work up, Programmer, applications ([place])   Truitt Merle, LCSW 01/03/2017, 4:32 PM

## 2017-01-03 NOTE — Progress Notes (Signed)
Physical Therapy Treatment Patient Details Name: Terry Potter MRN: [id number] DOB: April 19, 1956 Today's Date: 01/03/2017    History of Present Illness Pt is a 61 y.o. female who presented to the ED with L hip pain  and bite injuries to B lower legs following attack by 3 dogs with a fall. Pt found to have displaced L femoral neck fracture. Pt now s/p L total hip arthroplasty anterior approach. PMH significant for arthritis, bipolar disorder, ectopic pregnancy, and polysubstance abuse.    PT Comments    Continuing work on functional mobility and activity tolerance;  Noting good progress with amb distance and good participation in therex; Continue to agree with SNF for short term rehab to maximize independence and safety with mobility prior to dc home   Follow Up Recommendations  SNF;Supervision for mobility/OOB     Equipment Recommendations  Rolling walker with 5" wheels;3in1 (PT)    Recommendations for Other Services       Precautions / Restrictions Precautions Precautions: Fall Restrictions LLE Weight Bearing: Weight bearing as tolerated    Mobility  Bed Mobility               General bed mobility comments: Sittign EOB upon arrival  Transfers Overall transfer level: Needs assistance Equipment used: Rolling walker (2 wheeled) Transfers: Sit to/from Stand Sit to Stand: Min guard         General transfer comment: Cues for hand placement and safety  Ambulation/Gait Ambulation/Gait assistance: Min guard Ambulation Distance (Feet): 40 Feet Assistive device: Rolling walker (2 wheeled) Gait Pattern/deviations: Step-to pattern;Decreased stance time - left;Decreased step length - right;Decreased stride length;Decreased weight shift to left;Shuffle;Antalgic Gait velocity: decreased   General Gait Details: Cues to activate L quad and gluteals for greater stance stabiltiy and overall for better posture in standing; cues also to self-monitor for activity  tolerance   Stairs            Wheelchair Mobility    Modified Rankin (Stroke Patients Only)       Balance Overall balance assessment: Needs assistance   Sitting balance-Leahy Scale: Good     Standing balance support: Bilateral upper extremity supported;During functional activity Standing balance-Leahy Scale: Fair                              Cognition Arousal/Alertness: Awake/alert Behavior During Therapy: WFL for tasks assessed/performed Overall Cognitive Status: Within Functional Limits for tasks assessed                                        Exercises Total Joint Exercises Ankle Circles/Pumps: AROM;Both;20 reps Quad Sets: AROM;Left;20 reps Gluteal Sets: AROM;Both;10 reps Towel Squeeze: AROM;Both;10 reps Heel Slides: AAROM;Left;10 reps Hip ABduction/ADduction: AAROM;Left;10 reps    General Comments        Pertinent Vitals/Pain Pain Assessment: 0-10 Pain Score: 7  Pain Location: Lt thigh and stomach Pain Descriptors / Indicators: Aching;Sore Pain Intervention(s): Monitored during session    Home Living                      Prior Function            PT Goals (current goals can now be found in the care plan section) Acute Rehab PT Goals Patient Stated Goal: To go to rehab so can go home safely PT Goal Formulation: With  patient Time For Goal Achievement: 01/09/17 Potential to Achieve Goals: Good Progress towards PT goals: Progressing toward goals    Frequency    7X/week      PT Plan Current plan remains appropriate    Co-evaluation              AM-PAC PT "6 Clicks" Daily Activity  Outcome Measure  Difficulty turning over in bed (including adjusting bedclothes, sheets and blankets)?: None Difficulty moving from lying on back to sitting on the side of the bed? : A Little Difficulty sitting down on and standing up from a chair with arms (e.g., wheelchair, bedside commode, etc,.)?: A  Little Help needed moving to and from a bed to chair (including a wheelchair)?: A Little Help needed walking in hospital room?: A Little Help needed climbing 3-5 steps with a railing? : A Little 6 Click Score: 19    End of Session Equipment Utilized During Treatment: Gait belt Activity Tolerance: Patient tolerated treatment well Patient left: in chair;with call bell/phone within reach;with chair alarm set Nurse Communication: Mobility status PT Visit Diagnosis: Unsteadiness on feet (R26.81);Other abnormalities of gait and mobility (R26.89);History of falling (Z91.81);Pain Pain - Right/Left: Left Pain - part of body: Leg     Time: 9628-3662 (End time is approximate) PT Time Calculation (min) (ACUTE ONLY): 29 min  Charges:  $Gait Training: 8-22 mins $Therapeutic Exercise: 8-22 mins                    G Codes:       Terry Potter, PT  Acute Rehabilitation Services Pager [phone] Office [phone]    Terry Potter 01/03/2017, 5:17 PM

## 2017-01-03 NOTE — Transfer of Care (Signed)
Immediate Anesthesia Transfer of Care Note  Patient: Terry Potter  Procedure(s) Performed: Procedure(s): TOTAL HIP ARTHROPLASTY ANTERIOR APPROACH (Left)  Patient Location: PACU  Anesthesia Type:General  Level of Consciousness: awake, alert , oriented and patient cooperative  Airway & Oxygen Therapy: Patient Spontanous Breathing and Patient connected to nasal cannula oxygen  Post-op Assessment: Report given to RN and Post -op Vital signs reviewed and stable  Post vital signs: Reviewed and stable  Last Vitals:  Vitals:   01/02/17 2018 01/03/17 0424  BP: (!) 98/52 104/64  Pulse: 84 81  Resp: 17 17  Temp: 36.7 C 37.3 C    Last Pain:  Vitals:   01/03/17 0424  TempSrc: Oral  PainSc:       Patients Stated Pain Goal: 2 (01/02/17 2000)  Complications: No apparent anesthesia complications

## 2017-01-03 NOTE — Progress Notes (Signed)
PROGRESS NOTE    Terry Potter  QMG:[id number] DOB: 1955/07/29 DOA: 01/01/2017 PCP: Erlinda Hong, MD  Outpatient Specialists:     Brief Narrative:  54 ? 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 before in 2016 chronic cocaine abuse Neuropathy NOS  Attacked by 3 dogs 7/20 am-fell had hip # Admitted to [facility] for surgery Ortho performed the same 7/20   Assessment & Plan:   Principal Problem:   Hip fracture, unspecified laterality, closed, initial encounter [facility]) Active Problems:   Schizoaffective disorder, bipolar type (HCC)   Hip fracture (HCC)   Displaced fracture of left femoral neck (HCC)   Hip # s/p repair 6/20 1. per discretion Ortho services-appreciate their input into   Anticoagulation post-op--ASA 325 bid  Weight bearing tolerance for therapy services-WBAT  Wound care  Pain management  Follow-up services required  Anemia of both hemodulition and acute expected blood loss post op  Normocytic  Follow trends--Baslein 12, now 8.1  transfuse if less than 7  Hypokalemia on admit 3.4  Resolved   Bipolar  On multiple meds  Cont vistaril 25  Bid, Risperdal 1 bid, trileptal 300 tid  Dog bite  Given rabies vaccine 7/22 and will nee follow up  Augmentin 10 days ending 7/30  Local wound care to woundsat SNF  imparied gluc tolerance  Get A1c--still pending  Needs OP discussion of borderline--will follow  Cocaine use  ? Willing to quit  Started ~ 10 yrs ago  hypotension  Constitutional? And patient not orthostatc  DVT-loenox Non tele inpt Patient alone bedside Needs SNF, likely d/c 7/23  Consultants:    ortho  Procedures:   Hip repair 7/20  Antimicrobials:   augmentin 7/20    Subjective:  [place] for rehab Has pain but manageable on meds   Objective: Vitals:   01/02/17 1033 01/02/17 1802 01/02/17 2018 01/03/17 0424  BP: (!) 81/58 (!) 88/49 (!) 98/52 104/64  Pulse: (!) 107 61 84 81  Resp:   18 17 17   Temp:   98 F (36.7 C) 99.2 F (37.3 C)  TempSrc:   Oral Oral  SpO2:  98% 100% 100%  Weight:      Height:        Intake/Output Summary (Last 24 hours) at 01/03/17 1212 Last data filed at 01/03/17 0636  Gross per 24 hour  Intake          2066.25 ml  Output             3000 ml  Net          -933.75 ml   Filed Weights   01/01/17 1330  Weight: 64.7 kg (142 lb 11.2 oz)    Examination:  eomi ncat Chest clear Hip wound is not visualized--bandages on R side are ok abd soft s1 s2 no m/r/g   Data Reviewed: I have personally reviewed following labs and imaging studies  CBC:  Recent Labs Lab 01/01/17 0746 01/02/17 0423 01/02/17 1033 01/03/17 0402  WBC 7.6 5.7 7.4 6.9  NEUTROABS 5.2  --  4.7  --   HGB 13.4 8.8* 9.2* 8.1*  HCT 38.6 26.1* 26.5* 23.7*  MCV 89.4 91.9 91.4 90.8  PLT 171 120* 116* 122*   Basic Metabolic Panel:  Recent Labs Lab 01/01/17 0746 01/02/17 0423 01/03/17 0402  NA 137 134* 138  K 3.4* 4.9 4.3  CL 101 108 109  CO2 26 19* 25  GLUCOSE 101* 193* 96  BUN 16 11 8   CREATININE 0.94 0.76 0.73  CALCIUM 9.1 7.9* 8.0*   GFR: Estimated Creatinine Clearance: 63.8 mL/min (by C-G formula based on SCr of 0.73 mg/dL). Liver Function Tests:  Recent Labs Lab 01/01/17 0746 01/02/17 0423  AST 28 31  ALT 20 14  ALKPHOS 79 49  BILITOT 0.4 0.5  PROT 7.1 5.0*  ALBUMIN 3.8 2.9*   No results for input(s): LIPASE, AMYLASE in the last 168 hours. No results for input(s): AMMONIA in the last 168 hours. Coagulation Profile: No results for input(s): INR, PROTIME in the last 168 hours. Cardiac Enzymes: No results for input(s): CKTOTAL, CKMB, CKMBINDEX, TROPONINI in the last 168 hours. BNP (last 3 results) No results for input(s): PROBNP in the last 8760 hours. HbA1C: No results for input(s): HGBA1C in the last 72 hours. CBG: No results for input(s): GLUCAP in the last 168 hours. Lipid Profile: No results for input(s): CHOL, HDL, LDLCALC, TRIG,  CHOLHDL, LDLDIRECT in the last 72 hours. Thyroid Function Tests: No results for input(s): TSH, T4TOTAL, FREET4, T3FREE, THYROIDAB in the last 72 hours. Anemia Panel: No results for input(s): VITAMINB12, FOLATE, FERRITIN, TIBC, IRON, RETICCTPCT in the last 72 hours. Urine analysis:    Component Value Date/Time   COLORURINE YELLOW 01/01/2017 0940   APPEARANCEUR CLEAR 01/01/2017 0940   LABSPEC 1.010 01/01/2017 0940   PHURINE 6.0 01/01/2017 0940   GLUCOSEU NEGATIVE 01/01/2017 0940   HGBUR NEGATIVE 01/01/2017 0940   BILIRUBINUR NEGATIVE 01/01/2017 0940   KETONESUR NEGATIVE 01/01/2017 0940   PROTEINUR NEGATIVE 01/01/2017 0940   UROBILINOGEN 1.0 12/16/2014 1325   NITRITE NEGATIVE 01/01/2017 0940   LEUKOCYTESUR NEGATIVE 01/01/2017 0940   Sepsis Labs: @LABRCNTIP (procalcitonin:4,lacticidven:4)  ) Recent Results (from the past 240 hour(s))  Surgical PCR screen     Status: None   Collection Time: 01/01/17  3:29 PM  Result Value Ref Range Status   MRSA, PCR NEGATIVE NEGATIVE Final   Staphylococcus aureus NEGATIVE NEGATIVE Final    Comment:        The Xpert SA Assay (FDA approved for NASAL specimens in patients over 24 years of age), is one component of a comprehensive surveillance program.  Test performance has been validated by [facility] for patients greater than or equal to 80 year old. It is not intended to diagnose infection nor to guide or monitor treatment.          Radiology Studies: Dg C-arm 1-60 Min  Result Date: 01/01/2017 CLINICAL DATA:  Left hip arthroplasty. EXAM: DG C-ARM 61-120 MIN; OPERATIVE LEFT HIP WITH PELVIS COMPARISON:  01/01/2017. FINDINGS: Five intraoperative spot fluoro films were obtained. Initial film show a femoral sizing device with acetabular cup in place. Final 2 images document placement of the femoral component. No evidence for immediate hardware complications. IMPRESSION: status post left total hip arthroplasty without evidence for immediate  hardware complications. Electronically Signed   By: 01/03/2017 M.D.   On: 01/01/2017 19:06   Dg Hip Operative Unilat W Or W/o Pelvis Left  Result Date: 01/01/2017 CLINICAL DATA:  Left hip arthroplasty. EXAM: DG C-ARM 61-120 MIN; OPERATIVE LEFT HIP WITH PELVIS COMPARISON:  01/01/2017. FINDINGS: Five intraoperative spot fluoro films were obtained. Initial film show a femoral sizing device with acetabular cup in place. Final 2 images document placement of the femoral component. No evidence for immediate hardware complications. IMPRESSION: status post left total hip arthroplasty without evidence for immediate hardware complications. Electronically Signed   By: 01/03/2017 M.D.   On: 01/01/2017  19:06     Scheduled Meds: . amoxicillin-clavulanate  1 tablet Oral Q12H  . aspirin EC  325 mg Oral BID WC  . gabapentin  300 mg Oral TID  . Oxcarbazepine  300 mg Oral TID  . risperiDONE  1 mg Oral BID  . senna  1 tablet Oral BID   Continuous Infusions: . sodium chloride 75 mL/hr at 01/02/17 2053     LOS: 2 days   Time spent: 67  Pleas Koch, MD [facility]   If 7PM-7AM, please contact night-coverage [email or web address] Password [email or web address] 01/03/2017, 12:12 PM

## 2017-01-03 NOTE — Anesthesia Postprocedure Evaluation (Signed)
Anesthesia Post Note  Patient: Terry Potter  Procedure(s) Performed: Procedure(s) (LRB): TOTAL HIP ARTHROPLASTY ANTERIOR APPROACH (Left)     Patient location during evaluation: PACU Anesthesia Type: General Level of consciousness: awake Pain management: pain level controlled Vital Signs Assessment: post-procedure vital signs reviewed and stable Respiratory status: spontaneous breathing Cardiovascular status: stable Anesthetic complications: no    Last Vitals:  Vitals:   01/02/17 2018 01/03/17 0424  BP: (!) 98/52 104/64  Pulse: 84 81  Resp: 17 17  Temp: 36.7 C 37.3 C    Last Pain:  Vitals:   01/03/17 0900  TempSrc:   PainSc: 4                  Destin Kittler

## 2017-01-03 NOTE — Progress Notes (Signed)
Subjective: 2 Days Post-Op Procedure(s) (LRB): TOTAL HIP ARTHROPLASTY ANTERIOR APPROACH (Left) Patient reports pain as 2 on 0-10 scale. Dressing is dry. Hbg 8.1. No major issues this morning.   Objective: Vital signs in last 24 hours: Temp:  [98 F (36.7 C)-99.2 F (37.3 C)] 99.2 F (37.3 C) (07/22 0424) Pulse Rate:  [61-107] 81 (07/22 0424) Resp:  [17-18] 17 (07/22 0424) BP: (81-104)/(47-64) 104/64 (07/22 0424) SpO2:  [98 %-100 %] 100 % (07/22 0424)  Intake/Output from previous day: 07/21 0701 - 07/22 0700 In: 2066.3 [P.O.:1200; I.V.:866.3] Out: 3400 [Urine:3400] Intake/Output this shift: No intake/output data recorded.   Recent Labs  01/01/17 0746 01/02/17 0423 01/02/17 1033 01/03/17 0402  HGB 13.4 8.8* 9.2* 8.1*    Recent Labs  01/02/17 1033 01/03/17 0402  WBC 7.4 6.9  RBC 2.90* 2.61*  HCT 26.5* 23.7*  PLT 116* 122*    Recent Labs  01/02/17 0423 01/03/17 0402  NA 134* 138  K 4.9 4.3  CL 108 109  CO2 19* 25  BUN 11 8  CREATININE 0.76 0.73  GLUCOSE 193* 96  CALCIUM 7.9* 8.0*   No results for input(s): LABPT, INR in the last 72 hours.  Dorsiflexion/Plantar flexion intact  Assessment/Plan: 2 Days Post-Op Procedure(s) (LRB): TOTAL HIP ARTHROPLASTY ANTERIOR APPROACH (Left) Up with therapy  Cailee Blanke A 01/03/2017, 8:17 AM

## 2017-01-04 ENCOUNTER — Encounter (HOSPITAL_COMMUNITY): Payer: Self-pay | Admitting: Orthopedic Surgery

## 2017-01-04 LAB — CBC
HEMATOCRIT: 23.3 % — AB (ref 36.0–46.0)
HEMOGLOBIN: 7.9 g/dL — AB (ref 12.0–15.0)
MCH: 30.5 pg (ref 26.0–34.0)
MCHC: 33.9 g/dL (ref 30.0–36.0)
MCV: 90 fL (ref 78.0–100.0)
Platelets: 106 10*3/uL — ABNORMAL LOW (ref 150–400)
RBC: 2.59 MIL/uL — AB (ref 3.87–5.11)
RDW: 14.4 % (ref 11.5–15.5)
WBC: 6.3 10*3/uL (ref 4.0–10.5)

## 2017-01-04 MED ORDER — AMOXICILLIN-POT CLAVULANATE 875-125 MG PO TABS: 1.0000 | ORAL_TABLET | Freq: Two times a day (BID) | ORAL | 0 refills | Status: DC

## 2017-01-04 MED ORDER — POLYETHYLENE GLYCOL 3350 17 G PO PACK: 17.0000 g | PACK | Freq: Every day | ORAL | 0 refills | Status: DC | PRN

## 2017-01-04 MED ORDER — SENNA 8.6 MG PO TABS: 1.0000 | ORAL_TABLET | Freq: Two times a day (BID) | ORAL | 0 refills | Status: DC

## 2017-01-04 NOTE — Progress Notes (Signed)
Physical Therapy Treatment Patient Details Name: Terry Potter MRN: [id number] DOB: 1955-07-31 Today's Date: 01/04/2017    History of Present Illness Pt is a 61 y.o. female who presented to the ED with L hip pain  and bite injuries to B lower legs following attack by 3 dogs with a fall. Pt found to have displaced L femoral neck fracture. Pt now s/p L total hip arthroplasty anterior approach. PMH significant for arthritis, bipolar disorder, ectopic pregnancy, and polysubstance abuse.    PT Comments    Pt eager to learn and improve in today's session. Today's skilled session focused on gait training and HEP. Reviewed standing HEP. Pt increased ambulation distance and negotiated stairs, as she is d/c home today with aid instead of SNF.    Follow Up Recommendations  SNF;Supervision for mobility/OOB     Equipment Recommendations  Rolling walker with 5" wheels;3in1 (PT)    Recommendations for Other Services       Precautions / Restrictions Precautions Precautions: Fall Restrictions Weight Bearing Restrictions: Yes LLE Weight Bearing: Weight bearing as tolerated    Mobility  Bed Mobility               General bed mobility comments: in chair on arrival  Transfers Overall transfer level: Needs assistance Equipment used: Rolling walker (2 wheeled) Transfers: Sit to/from Stand Sit to Stand: Min guard         General transfer comment: Good technique, no physical assist required. Min guard for safety.  Ambulation/Gait Ambulation/Gait assistance: Min guard Ambulation Distance (Feet): 120 Feet Assistive device: Rolling walker (2 wheeled) Gait Pattern/deviations: Antalgic;Step-through pattern Gait velocity: decreased Gait velocity interpretation: Below normal speed for age/gender General Gait Details: Slightly antalgic gait. Cues for walker proximity. Pt eager to learn and improve.   Stairs Stairs: Yes   Stair Management: No rails;Step to pattern;Backwards;With  walker Number of Stairs: 4 General stair comments: Pt requrested instruction on stair negotiation as she is d/c home today, no longer going to SNF. Mod cueing for technique to ascend and descend stairs. Advised pt to have second person present to brace walker.  Wheelchair Mobility    Modified Rankin (Stroke Patients Only)       Balance Overall balance assessment: Needs assistance Sitting-balance support: No upper extremity supported;Feet supported Sitting balance-Leahy Scale: Good     Standing balance support: Bilateral upper extremity supported;During functional activity Standing balance-Leahy Scale: Fair                              Cognition Arousal/Alertness: Awake/alert Behavior During Therapy: WFL for tasks assessed/performed Overall Cognitive Status: Within Functional Limits for tasks assessed                                        Exercises Total Joint Exercises Hip ABduction/ADduction: AROM;Left;5 reps;Standing Knee Flexion: AROM;Left;5 reps;Standing Marching in Standing: AROM;Left;5 reps;Standing Standing Hip Extension: AROM;Left;5 reps;Standing    General Comments        Pertinent Vitals/Pain Pain Assessment: Faces Faces Pain Scale: Hurts a little bit Pain Location: Lt thigh and stomach Pain Descriptors / Indicators: Aching;Sore Pain Intervention(s): Monitored during session    Home Living                      Prior Function  PT Goals (current goals can now be found in the care plan section) Acute Rehab PT Goals Patient Stated Goal: To go to rehab so can go home safely PT Goal Formulation: With patient Time For Goal Achievement: 01/09/17 Potential to Achieve Goals: Good Progress towards PT goals: Progressing toward goals    Frequency    7X/week      PT Plan Current plan remains appropriate    Co-evaluation              AM-PAC PT "6 Clicks" Daily Activity  Outcome Measure   Difficulty turning over in bed (including adjusting bedclothes, sheets and blankets)?: None Difficulty moving from lying on back to sitting on the side of the bed? : A Little Difficulty sitting down on and standing up from a chair with arms (e.g., wheelchair, bedside commode, etc,.)?: A Little Help needed moving to and from a bed to chair (including a wheelchair)?: A Little Help needed walking in hospital room?: A Little Help needed climbing 3-5 steps with a railing? : A Little 6 Click Score: 19    End of Session Equipment Utilized During Treatment: Gait belt Activity Tolerance: Patient tolerated treatment well Patient left: in chair;with call bell/phone within reach;with family/visitor present Nurse Communication: Mobility status PT Visit Diagnosis: Unsteadiness on feet (R26.81);Other abnormalities of gait and mobility (R26.89);History of falling (Z91.81);Pain Pain - Right/Left: Left Pain - part of body: Leg     Time: 0272-5366 PT Time Calculation (min) (ACUTE ONLY): 23 min  Charges:  $Gait Training: 8-22 mins $Therapeutic Exercise: 8-22 mins                    G Codes:      Kallie Locks, [place] Pager [phone] Acute Rehab   Sheral Apley 01/04/2017, 12:48 PM

## 2017-01-04 NOTE — Clinical Social Work Note (Addendum)
CSW was following pt for SNF discharge. CSW informed by RN that pt and MD agreeable to discharge home with home health. RNCM Darl Pikes aware and following for discharge needs. CSW signing off as no further Social Work needs identified. Please reconsult if new Social Work needs arise.   Corlis Hove, LCSWA, LCASA Clinical Social Work [phone]

## 2017-01-04 NOTE — Discharge Summary (Signed)
Physician Discharge Summary  Terry Potter VQM:[id number] DOB: 1956/04/19 DOA: 01/01/2017  PCP: Erlinda Hong, MD  Admit date: 01/01/2017 Discharge date: 01/04/2017  Time spent: 35 minutes  Recommendations for Outpatient Follow-up:  1. Anticoagulation will be with aspirin 325 daily twice a day until date below  2. Needs follow-up with orthopedic surgeon as an outpatient as per his discretion 3. Weightbearing tolerated left lower extremity 4. Finish off course of Augmentin for dog bites from this admission 5. Started iron 325 twice a day this admission 6. Home health PT ordered on discharge 7. Needs CBC in about one week  Discharge Diagnoses:  Principal Problem:   Hip fracture, unspecified laterality, closed, initial encounter [facility]) Active Problems:   Schizoaffective disorder, bipolar type (HCC)   Hip fracture (HCC)   Displaced fracture of left femoral neck (HCC)   Discharge Condition: Improved  Diet recommendation: Heart healthy  Filed Weights   01/01/17 1330  Weight: 64.7 kg (142 lb 11.2 oz)    History of present illness:  61 ? 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 per discretion Ortho services-appreciate their input into   Anticoagulation post-op--ASA 325 bid  Weight bearing tolerance for therapy services-WBAT  Wound care  Pain management  Follow-up services required  Anemia of both hemodulition and acute expected blood loss post op             Normocytic             Follow trends--Baslein 12, now 8.1             transfuse if less than 7  Hemoglobin stable in the 8 range, started iron twice a day which should continue for the time being  Hypokalemia on admit 3.4             Resolved               Bipolar             On multiple meds             Cont vistaril 25  Bid, Risperdal 1 bid, trileptal 300 tid  Needs outpatient psychiatric follow-up at [facility]  Dog bite             Given rabies vaccine 7/22 and will nee follow up             Augmentin 10 days ending 7/30             Local wound care to woundsat SNF  imparied gluc tolerance             Get A1c--still pending             Needs OP discussion of borderline--will follow  Cocaine use             ? Willing to quit             Started ~ 10 yrs ago  hypotension             Constitutional? Resolved and patient not dizzy any further  Procedures: Anterior hip arthroplasty 7/20  Consultations:  Orthopedics  Discharge Exam: Vitals:   01/03/17  2048 01/04/17 0632  BP: 115/60 (!) 98/52  Pulse: 75 70  Resp: 18 18  Temp: 99.7 F (37.6 C) 98.7 F (37.1 C)   Alert pleasant oriented No other issue Ambulating to the restroom carefully with walker Neurologically intact  Discharge Instructions   Discharge Instructions    Diet - low sodium heart healthy    Complete by:  As directed    Discharge instructions    Complete by:  As directed    Take aspirin 325 2 tablets daily for 6 weeks until Aug 31st You will get pain meds refilled if needed by your primary or dr. Avis Epley will also need to finish your Augmentin for the dog bite on August 2nd We will rx Iron for you-take it 2 times a day--it can cause some dark stool.  Monitor Labs in 1 week   Increase activity slowly    Complete by:  As directed    Weight bearing as tolerated    Complete by:  As directed      Current Discharge Medication List    START taking these medications   Details  amoxicillin-clavulanate (AUGMENTIN) 875-125 MG tablet Take 1 tablet by mouth every 12 (twelve) hours. Qty: 20 tablet, Refills: 0    aspirin EC 325 MG EC tablet Take 1 tablet (325 mg total) by mouth 2 (two) times daily with a meal. Qty: 30 tablet, Refills: 0     HYDROcodone-acetaminophen (NORCO/VICODIN) 5-325 MG tablet Take 1-2 tablets by mouth every 6 (six) hours as needed for moderate pain. Qty: 60 tablet, Refills: 0    polyethylene glycol (MIRALAX / GLYCOLAX) packet Take 17 g by mouth daily as needed for mild constipation. Qty: 14 each, Refills: 0    senna (SENOKOT) 8.6 MG TABS tablet Take 1 tablet (8.6 mg total) by mouth 2 (two) times daily. Qty: 120 each, Refills: 0      CONTINUE these medications which have NOT CHANGED   Details  fluticasone (FLONASE) 50 MCG/ACT nasal spray Place 1 spray into both nostrils daily as needed for rhinitis.    gabapentin (NEURONTIN) 300 MG capsule Take 300 mg by mouth 3 (three) times daily.    hydrOXYzine (ATARAX/VISTARIL) 25 MG tablet Take 25 mg by mouth 2 (two) times daily as needed for anxiety (and/or agitation).    Multiple Vitamin (MULTIVITAMIN WITH MINERALS) TABS tablet Take 1 tablet by mouth daily.    Oxcarbazepine (TRILEPTAL) 300 MG tablet Take 300 mg by mouth 3 (three) times daily.    PROAIR HFA 108 (90 BASE) MCG/ACT inhaler Inhale 1-2 puffs into the lungs every 4 (four) hours as needed for wheezing or shortness of breath.     risperiDONE (RISPERDAL) 1 MG tablet Take 1 mg by mouth 2 (two) times daily.      STOP taking these medications     aspirin 325 MG tablet      naproxen (NAPROSYN) 500 MG tablet        Allergies  Allergen Reactions  . Tetracyclines & Related Hives   Follow-up Information    Swinteck, Arlys John, MD. Schedule an appointment as soon as possible for a visit in 2 week(s).   Specialty:  Orthopedic Surgery Why:  For wound re-check Contact information: [address] [place] [address] [phone]        [facility] Follow up.   Specialty:  Home Health Services Why:  A representative from [facility] will contact you to arrange start date and time for  your therapy. Contact information: [address] [place] [address] [phone]            The results of significant diagnostics from this hospitalization (including imaging, microbiology, ancillary and laboratory) are listed below for reference.    Significant Diagnostic Studies: Dg Chest 2 View  Result Date: 01/01/2017 CLINICAL DATA:  61 year old female with history of attack by a canine during which she fell to the ground. Body pain. EXAM: CHEST  2 VIEW COMPARISON:  Chest x-ray a 12/16/2014. FINDINGS: Lung volumes are normal. No consolidative airspace disease. No pleural effusions. No pneumothorax. No pulmonary nodule or mass noted. Pulmonary vasculature and the cardiomediastinal silhouette are within normal limits. Atherosclerosis in the thoracic aorta. IMPRESSION: 1.  No radiographic evidence of acute cardiopulmonary disease. 2. Aortic atherosclerosis. Aortic Atherosclerosis (ICD10-I70.0). Electronically Signed   By: Trudie Reed M.D.   On: 01/01/2017 08:30   Ct Head Wo Contrast  Result Date: 01/01/2017 CLINICAL DATA:  Recent fall, possible head injury EXAM: CT HEAD WITHOUT CONTRAST TECHNIQUE: Contiguous axial images were obtained from the base of the skull through the vertex without intravenous contrast. COMPARISON:  None available FINDINGS: Brain: Minor patchy chronic white matter microvascular ischemic changes throughout both cerebral hemispheres. No acute intracranial hemorrhage, mass lesion, new infarction, midline shift, herniation, hydrocephalus, or extra-axial fluid collection. No focal mass effect or edema. Cisterns are patent. No cerebellar abnormality. Vascular: Intracranial atherosclerosis at the skullbase. Skull: Normal. Negative for fracture or focal lesion. Sinuses/Orbits: No acute finding. Other: None. IMPRESSION: Minor chronic white matter microvascular ischemic changes. No acute intracranial abnormality by noncontrast CT. Electronically Signed   By: Judie Petit.  Shick M.D.   On: 01/01/2017 08:34   Pelvis  Portable  Result Date: 01/01/2017 CLINICAL DATA:  Followup left hip arthroplasty. EXAM: PORTABLE PELVIS 1-2 VIEWS COMPARISON:  Earlier same day FINDINGS: Components well positioned. No radiographically detectable complication. Remainder the pelvis appears normal. IMPRESSION: Left hip arthroplasty.  No detectable complication. Electronically Signed   By: Paulina Fusi M.D.   On: 01/01/2017 20:39   Dg Knee Complete 4 Views Right  Result Date: 01/01/2017 CLINICAL DATA:  Fall with right knee pain.  Initial encounter. EXAM: RIGHT KNEE - COMPLETE 4+ VIEW COMPARISON:  None. FINDINGS: Question anterior soft tissue injury. No opaque foreign body. No evidence of fracture, dislocation, or joint effusion. Possible patellofemoral joint narrowing but no notable spurring. IMPRESSION: No acute osseous finding or joint effusion. Electronically Signed   By: Marnee Spring M.D.   On: 01/01/2017 08:30   Dg C-arm 1-60 Min  Result Date: 01/01/2017 CLINICAL DATA:  Left hip arthroplasty. EXAM: DG C-ARM 61-120 MIN; OPERATIVE LEFT HIP WITH PELVIS COMPARISON:  01/01/2017. FINDINGS: Five intraoperative spot fluoro films were obtained. Initial film show a femoral sizing device with acetabular cup in place. Final 2 images document placement of the femoral component. No evidence for immediate hardware complications. IMPRESSION: status post left total hip arthroplasty without evidence for immediate hardware complications. Electronically Signed   By: Kennith Center M.D.   On: 01/01/2017 19:06   Dg Hip Operative Unilat W Or W/o Pelvis Left  Result Date: 01/01/2017 CLINICAL DATA:  Left hip arthroplasty. EXAM: DG C-ARM 61-120 MIN; OPERATIVE LEFT HIP WITH PELVIS COMPARISON:  01/01/2017. FINDINGS: Five intraoperative spot fluoro films were obtained. Initial film show a femoral sizing device with acetabular cup in place. Final 2 images document placement of the femoral component. No evidence for immediate hardware complications. IMPRESSION:  status post left total  hip arthroplasty without evidence for immediate hardware complications. Electronically Signed   By: Kennith Center M.D.   On: 01/01/2017 19:06   Dg Hip Unilat W Or Wo Pelvis 2-3 Views Left  Result Date: 01/01/2017 CLINICAL DATA:  61 year old female with history of attack by a canine this morning, during which she fell to the ground. Left hip and right knee pain. EXAM: DG HIP (WITH OR WITHOUT PELVIS) 2-3V LEFT COMPARISON:  None. FINDINGS: Multiple views of the bony pelvis and hips bilaterally demonstrates a mildly comminuted and mildly displaced transcervical left femoral neck fracture. This appears mildly impacted, with at least 13 mm of proximal migration of the distal fracture fragment, and slight varus angulation. Left femoral head remains located in the acetabulum. Remaining portions of the bony pelvis and right hip are otherwise normal in appearance. Multiple pelvic phleboliths are noted. IMPRESSION: 1. Mildly comminuted, angulated and displaced left-sided transcervical femoral neck fracture, as above. Electronically Signed   By: Trudie Reed M.D.   On: 01/01/2017 08:33    Microbiology: Recent Results (from the past 240 hour(s))  Surgical PCR screen     Status: None   Collection Time: 01/01/17  3:29 PM  Result Value Ref Range Status   MRSA, PCR NEGATIVE NEGATIVE Final   Staphylococcus aureus NEGATIVE NEGATIVE Final    Comment:        The Xpert SA Assay (FDA approved for NASAL specimens in patients over 68 years of age), is one component of a comprehensive surveillance program.  Test performance has been validated by [facility] for patients greater than or equal to 67 year old. It is not intended to diagnose infection nor to guide or monitor treatment.      Labs: Basic Metabolic Panel:  Recent Labs Lab 01/01/17 0746 01/02/17 0423 01/03/17 0402  NA 137 134* 138  K 3.4* 4.9 4.3  CL 101 108 109  CO2 26 19* 25  GLUCOSE 101* 193* 96  BUN 16 11 8    CREATININE 0.94 0.76 0.73  CALCIUM 9.1 7.9* 8.0*   Liver Function Tests:  Recent Labs Lab 01/01/17 0746 01/02/17 0423  AST 28 31  ALT 20 14  ALKPHOS 79 49  BILITOT 0.4 0.5  PROT 7.1 5.0*  ALBUMIN 3.8 2.9*   No results for input(s): LIPASE, AMYLASE in the last 168 hours. No results for input(s): AMMONIA in the last 168 hours. CBC:  Recent Labs Lab 01/01/17 0746 01/02/17 0423 01/02/17 1033 01/03/17 0402 01/04/17 0331  WBC 7.6 5.7 7.4 6.9 6.3  NEUTROABS 5.2  --  4.7  --   --   HGB 13.4 8.8* 9.2* 8.1* 7.9*  HCT 38.6 26.1* 26.5* 23.7* 23.3*  MCV 89.4 91.9 91.4 90.8 90.0  PLT 171 120* 116* 122* 106*   Cardiac Enzymes: No results for input(s): CKTOTAL, CKMB, CKMBINDEX, TROPONINI in the last 168 hours. BNP: BNP (last 3 results) No results for input(s): BNP in the last 8760 hours.  ProBNP (last 3 results) No results for input(s): PROBNP in the last 8760 hours.  CBG: No results for input(s): GLUCAP in the last 168 hours.     Signed07/25/18 MD   [facility] 01/04/2017, 11:32 AM

## 2017-01-04 NOTE — Progress Notes (Signed)
   Subjective:  Patient reports pain as mild.  No c/o. Wants to go home. Min assist with therapy.  Objective:   VITALS:   Vitals:   01/03/17 0424 01/03/17 1343 01/03/17 2048 01/04/17 0632  BP: 104/64 (!) 94/52 115/60 (!) 98/52  Pulse: 81 86 75 70  Resp: 17 18 18 18   Temp: 99.2 F (37.3 C) 99 F (37.2 C) 99.7 F (37.6 C) 98.7 F (37.1 C)  TempSrc: Oral Oral Oral   SpO2: 100% 100% 100% 99%  Weight:      Height:        NAD ABD soft Sensation intact distally Intact pulses distally Dorsiflexion/Plantar flexion intact Incision: dressing C/D/I Compartment soft   Lab Results  Component Value Date   WBC 6.3 01/04/2017   HGB 7.9 (L) 01/04/2017   HCT 23.3 (L) 01/04/2017   MCV 90.0 01/04/2017   PLT 106 (L) 01/04/2017   BMET    Component Value Date/Time   NA 138 01/03/2017 0402   K 4.3 01/03/2017 0402   CL 109 01/03/2017 0402   CO2 25 01/03/2017 0402   GLUCOSE 96 01/03/2017 0402   BUN 8 01/03/2017 0402   CREATININE 0.73 01/03/2017 0402   CALCIUM 8.0 (L) 01/03/2017 0402   GFRNONAA >60 01/03/2017 0402   GFRAA >60 01/03/2017 0402     Assessment/Plan: 3 Days Post-Op   Principal Problem:   Hip fracture, unspecified laterality, closed, initial encounter (HCC) Active Problems:   Schizoaffective disorder, bipolar type (HCC)   Hip fracture (HCC)   Displaced fracture of left femoral neck (HCC)   WBAT with walker DVT ppx: ASA, SCDs, TEDs PO pain control PT/OT Dispo: would benefit from add'l acute PT, d/c to home with HHPT when medically ready   Johndaniel Catlin, 01/05/2017 01/04/2017, 9:46 AM   01/06/2017, MD Cell [phone]

## 2017-01-04 NOTE — Care Management Note (Signed)
Case Management Note  Patient Details  Name: Terry Potter MRN: [id number] Date of Birth: 06/14/1956  Subjective/Objective:     61 yr old female admitted after a fall resulting in left hip fracture, also had several dog bites. Patient underwent a left total hip arthroplasty.                Action/Plan: Case manager spoke with patient concerning discharge plan and DME needs. Choice was offered for Home Health Agency, referral was called to Katharina Caper, [facility] liaison. Patient says she will have some help at discharge. CM has ordered DME.     Expected Discharge Date:  01/04/17               Expected Discharge Plan:  Home w Home Health Services  In-House Referral:  NA  Discharge planning Services  CM Consult  Post Acute Care Choice:  Durable Medical Equipment, Home Health Choice offered to:  Patient  DME Arranged:  3-N-1, Walker rolling DME Agency:  [facility]  HH Arranged:  PT [facility] Agency:  [facility]  Status of Service:  Completed, signed off  If discussed at Long Length of Stay Meetings, dates discussed:    Additional Comments:  Durenda Guthrie, RN 01/04/2017, 10:36 AM

## 2017-01-06 LAB — HEMOGLOBIN A1C
Hgb A1c MFr Bld: 5 % (ref 4.8–5.6)
MEAN PLASMA GLUCOSE: 97 mg/dL

## 2017-05-21 ENCOUNTER — Emergency Department (HOSPITAL_COMMUNITY)
Admission: EM | Admit: 2017-05-21 | Discharge: 2017-05-21 | Disposition: A | Discharge status: Home / Self Care | Payer: Medicare Other | Attending: Emergency Medicine | Admitting: Emergency Medicine

## 2017-05-21 ENCOUNTER — Encounter (HOSPITAL_COMMUNITY): Payer: Self-pay

## 2017-05-21 ENCOUNTER — Other Ambulatory Visit: Payer: Self-pay

## 2017-05-21 ENCOUNTER — Emergency Department (HOSPITAL_COMMUNITY): Payer: Medicare Other

## 2017-05-21 DIAGNOSIS — Z5321 Procedure and treatment not carried out due to patient leaving prior to being seen by health care provider: Secondary | ICD-10-CM | POA: Diagnosis not present

## 2017-05-21 DIAGNOSIS — R079 Chest pain, unspecified: Secondary | ICD-10-CM | POA: Insufficient documentation

## 2017-05-21 LAB — CBC
HEMATOCRIT: 39 % (ref 36.0–46.0)
HEMOGLOBIN: 12.7 g/dL (ref 12.0–15.0)
MCH: 28.9 pg (ref 26.0–34.0)
MCHC: 32.6 g/dL (ref 30.0–36.0)
MCV: 88.8 fL (ref 78.0–100.0)
Platelets: 194 10*3/uL (ref 150–400)
RBC: 4.39 MIL/uL (ref 3.87–5.11)
RDW: 19.4 % — ABNORMAL HIGH (ref 11.5–15.5)
WBC: 4.4 10*3/uL (ref 4.0–10.5)

## 2017-05-21 LAB — BASIC METABOLIC PANEL
ANION GAP: 8 (ref 5–15)
BUN: 21 mg/dL — AB (ref 6–20)
CHLORIDE: 107 mmol/L (ref 101–111)
CO2: 25 mmol/L (ref 22–32)
Calcium: 8.8 mg/dL — ABNORMAL LOW (ref 8.9–10.3)
Creatinine, Ser: 1.18 mg/dL — ABNORMAL HIGH (ref 0.44–1.00)
GFR calc Af Amer: 56 mL/min — ABNORMAL LOW (ref >60)
GFR, EST NON AFRICAN AMERICAN: 48 mL/min — AB (ref >60)
GLUCOSE: 97 mg/dL (ref 65–99)
POTASSIUM: 5 mmol/L (ref 3.5–5.1)
Sodium: 140 mmol/L (ref 135–145)

## 2017-05-21 LAB — I-STAT TROPONIN, ED: Troponin i, poc: 0 ng/mL (ref 0.00–0.08)

## 2017-05-21 NOTE — ED Notes (Signed)
Pt called in lobby for vitals update. No response. 

## 2017-05-21 NOTE — ED Notes (Signed)
Second call in lobby. No response. 

## 2017-05-21 NOTE — ED Notes (Signed)
Called Pt 3x to reassess vitals.Pt did not resond

## 2017-05-21 NOTE — ED Triage Notes (Signed)
Pt states her chest started hurting last night. Described as soreness. 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 Pt also reports shortness of breath. Skin warm and dry. BP 90/58 in triage.

## 2017-05-21 NOTE — ED Notes (Signed)
No answer in waiting area.

## 2017-08-30 ENCOUNTER — Emergency Department (HOSPITAL_COMMUNITY): Payer: Medicare Other

## 2017-08-30 ENCOUNTER — Emergency Department (HOSPITAL_BASED_OUTPATIENT_CLINIC_OR_DEPARTMENT_OTHER)
Admit: 2017-08-30 | Discharge: 2017-08-30 | Disposition: A | Discharge status: Home / Self Care | Payer: Medicare Other | Attending: Emergency Medicine | Admitting: Emergency Medicine

## 2017-08-30 ENCOUNTER — Other Ambulatory Visit: Payer: Self-pay

## 2017-08-30 ENCOUNTER — Emergency Department (HOSPITAL_COMMUNITY)
Admission: EM | Admit: 2017-08-30 | Discharge: 2017-08-30 | Disposition: A | Discharge status: Home / Self Care | Payer: Medicare Other | Attending: Emergency Medicine | Admitting: Emergency Medicine

## 2017-08-30 ENCOUNTER — Encounter (HOSPITAL_COMMUNITY): Payer: Self-pay | Admitting: *Deleted

## 2017-08-30 DIAGNOSIS — R079 Chest pain, unspecified: Secondary | ICD-10-CM

## 2017-08-30 DIAGNOSIS — M79609 Pain in unspecified limb: Secondary | ICD-10-CM

## 2017-08-30 DIAGNOSIS — Z79899 Other long term (current) drug therapy: Secondary | ICD-10-CM | POA: Diagnosis not present

## 2017-08-30 DIAGNOSIS — Z202 Contact with and (suspected) exposure to infections with a predominantly sexual mode of transmission: Secondary | ICD-10-CM

## 2017-08-30 DIAGNOSIS — F17210 Nicotine dependence, cigarettes, uncomplicated: Secondary | ICD-10-CM | POA: Insufficient documentation

## 2017-08-30 DIAGNOSIS — N730 Acute parametritis and pelvic cellulitis: Secondary | ICD-10-CM

## 2017-08-30 DIAGNOSIS — Z7982 Long term (current) use of aspirin: Secondary | ICD-10-CM | POA: Insufficient documentation

## 2017-08-30 DIAGNOSIS — R319 Hematuria, unspecified: Secondary | ICD-10-CM | POA: Diagnosis not present

## 2017-08-30 DIAGNOSIS — N390 Urinary tract infection, site not specified: Secondary | ICD-10-CM | POA: Diagnosis not present

## 2017-08-30 DIAGNOSIS — R911 Solitary pulmonary nodule: Secondary | ICD-10-CM | POA: Diagnosis not present

## 2017-08-30 DIAGNOSIS — N739 Female pelvic inflammatory disease, unspecified: Secondary | ICD-10-CM | POA: Diagnosis not present

## 2017-08-30 DIAGNOSIS — R918 Other nonspecific abnormal finding of lung field: Secondary | ICD-10-CM

## 2017-08-30 LAB — CBC
HCT: 38.2 % (ref 36.0–46.0)
Hemoglobin: 12.6 g/dL (ref 12.0–15.0)
MCH: 30.3 pg (ref 26.0–34.0)
MCHC: 33 g/dL (ref 30.0–36.0)
MCV: 91.8 fL (ref 78.0–100.0)
PLATELETS: 190 10*3/uL (ref 150–400)
RBC: 4.16 MIL/uL (ref 3.87–5.11)
RDW: 16.4 % — AB (ref 11.5–15.5)
WBC: 4.5 10*3/uL (ref 4.0–10.5)

## 2017-08-30 LAB — RAPID HIV SCREEN (HIV 1/2 AB+AG)
HIV 1/2 Antibodies: NONREACTIVE
HIV-1 P24 Antigen - HIV24: NONREACTIVE

## 2017-08-30 LAB — D-DIMER, QUANTITATIVE: D-Dimer, Quant: 1.16 ug/mL-FEU — ABNORMAL HIGH (ref 0.00–0.50)

## 2017-08-30 LAB — URINALYSIS, ROUTINE W REFLEX MICROSCOPIC
Glucose, UA: NEGATIVE mg/dL
KETONES UR: 5 mg/dL — AB
Nitrite: POSITIVE — AB
Protein, ur: 30 mg/dL — AB
Specific Gravity, Urine: 1.029 (ref 1.005–1.030)
pH: 5 (ref 5.0–8.0)

## 2017-08-30 LAB — WET PREP, GENITAL
CLUE CELLS WET PREP: NONE SEEN
Sperm: NONE SEEN
TRICH WET PREP: NONE SEEN
YEAST WET PREP: NONE SEEN

## 2017-08-30 LAB — I-STAT TROPONIN, ED
Troponin i, poc: 0 ng/mL (ref 0.00–0.08)
Troponin i, poc: 0 ng/mL (ref 0.00–0.08)

## 2017-08-30 LAB — BASIC METABOLIC PANEL
Anion gap: 9 (ref 5–15)
BUN: 19 mg/dL (ref 6–20)
CO2: 23 mmol/L (ref 22–32)
CREATININE: 1.15 mg/dL — AB (ref 0.44–1.00)
Calcium: 8.6 mg/dL — ABNORMAL LOW (ref 8.9–10.3)
Chloride: 109 mmol/L (ref 101–111)
GFR calc Af Amer: 58 mL/min — ABNORMAL LOW (ref >60)
GFR calc non Af Amer: 50 mL/min — ABNORMAL LOW (ref >60)
GLUCOSE: 84 mg/dL (ref 65–99)
Potassium: 3.9 mmol/L (ref 3.5–5.1)
SODIUM: 141 mmol/L (ref 135–145)

## 2017-08-30 MED ORDER — LIDOCAINE HCL (PF) 1 % IJ SOLN
INTRAMUSCULAR | Status: AC
  Administered 2017-08-30: 0.9 mL
  Filled 2017-08-30: qty 5

## 2017-08-30 MED ORDER — IOPAMIDOL (ISOVUE-370) INJECTION 76%
INTRAVENOUS | Status: AC
  Administered 2017-08-30: 100 mL
  Filled 2017-08-30: qty 100

## 2017-08-30 MED ORDER — IOPAMIDOL (ISOVUE-300) INJECTION 61%
INTRAVENOUS | Status: AC
  Administered 2017-08-30: 75 mL
  Filled 2017-08-30: qty 100

## 2017-08-30 MED ORDER — CEPHALEXIN 500 MG PO CAPS: 500.0000 mg | ORAL_CAPSULE | Freq: Two times a day (BID) | ORAL | 0 refills | Status: AC

## 2017-08-30 MED ORDER — PANTOPRAZOLE SODIUM 20 MG PO TBEC
20.0000 mg | DELAYED_RELEASE_TABLET | Freq: Once | ORAL | Status: AC
  Administered 2017-08-30: 20 mg via ORAL
  Filled 2017-08-30 (×2): qty 1

## 2017-08-30 MED ORDER — AZITHROMYCIN 1 G PO PACK
1.0000 g | PACK | Freq: Once | ORAL | Status: AC
  Administered 2017-08-30: 1 g via ORAL
  Filled 2017-08-30: qty 1

## 2017-08-30 MED ORDER — SODIUM CHLORIDE 0.9 % IV BOLUS (SEPSIS)
500.0000 mL | Freq: Once | INTRAVENOUS | Status: AC
  Administered 2017-08-30: 500 mL via INTRAVENOUS

## 2017-08-30 MED ORDER — CEFTRIAXONE SODIUM 250 MG IJ SOLR
250.0000 mg | Freq: Once | INTRAMUSCULAR | Status: AC
  Administered 2017-08-30: 250 mg via INTRAMUSCULAR
  Filled 2017-08-30: qty 250

## 2017-08-30 MED ORDER — AZITHROMYCIN 1 G PO PACK: 1.0000 g | PACK | Freq: Once | ORAL | 0 refills | Status: AC

## 2017-08-30 NOTE — ED Notes (Signed)
Back from CT

## 2017-08-30 NOTE — Progress Notes (Signed)
Left lower extremity venous duplex has been completed. Negative for DVT. Results were given to [employer] PA.  08/30/17 6:32 PM Olen Cordial RVT

## 2017-08-30 NOTE — ED Provider Notes (Signed)
[facility] EMERGENCY DEPARTMENT Provider Note   CSN: [id number] Arrival date & time: 08/30/17  1055     History   Chief Complaint Chief Complaint  Patient presents with  . Vaginal Bleeding  . Chest Pain    HPI Terry Potter is a 62 y.o. female.  HPI   Pt is a 62 y/o female who presents to the ED today with multiple complaints.   Pt states she has had intermittent CP for the last week. Pain located in left lower chest and feels like sharp pain, "like knives". States pain intermittently radiates to her back. Intermittently radiates to right or left arm. Episodes last for several minutes and resolve spontaneously on their own. Rates pain 3/10 currently that began around 5:00am this morning.  States it hurts her to take a deep breath she has had shallow breathing because of this. Not associated with nausea, diaphoresis, exertion.   Pt also reports intermittent SOB for the last week. States that SOB is worse after she smokes cigars. Also reports dry cough that is chronic but worsened over the last several days. With associated rhinorrhea, nasal congestion, bilat ear pain. Had subjective fever several days ago, but this has resolved. Pt uses tobacco and smokes about 5 cigars per day.   She reports lower abd pain, pelvic pain, dysuria, frequency, vaginal discharge. States she does not know if she is having vaginal bleeding or hematuria, but does note blood on tissue and in toilet after using the restroom. Nausea, but no vomiting, diarrhea, or constipation.  Denies any blood in her stool. Reports new sexual partner this weekend, did not use protection.   Patient reports history of blood clot in her left leg.  Not anticoagulated currently.  No recent surgeries or trauma.  No recent admissions to the hospital.  No history of cancer.  States swelling to LLE noticed yesterday with associated pain. States this is not chronic.  Past Medical History:  Diagnosis Date  .  Arthritis   . Bipolar disorder (HCC)   . Ectopic pregnancy     Patient Active Problem List   Diagnosis Date Noted  . Hip fracture, unspecified laterality, closed, initial encounter (HCC) 01/01/2017  . Hip fracture (HCC) 01/01/2017  . Displaced fracture of left femoral neck (HCC) 01/01/2017  . Schizoaffective disorder, bipolar type (HCC) 03/19/2015    Class: Chronic    Past Surgical History:  Procedure Laterality Date  . ABDOMINAL SURGERY    . 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 TUMOR REMOVAL     Uterine tumor removed    OB History    No data available       Home Medications    Prior to Admission medications   Medication Sig Start Date End Date Taking? Authorizing Provider  aspirin EC 325 MG EC tablet Take 1 tablet (325 mg total) by mouth 2 (two) times daily with a meal. 01/03/17  Yes Constable, Amber, PA-C  clonazePAM (KLONOPIN) 0.5 MG tablet Take 0.5 mg by mouth as needed. 08/03/17  Yes [provider]  fluticasone (FLONASE) 50 MCG/ACT nasal spray Place 1 spray into both nostrils daily as needed for rhinitis.   Yes [provider]  gabapentin (NEURONTIN) 300 MG capsule Take 300 mg by mouth 3 (three) times daily.   Yes [provider]  HYDROcodone-acetaminophen (NORCO/VICODIN) 5-325 MG tablet Take  1-2 tablets by mouth every 6 (six) hours as needed for moderate pain. 01/03/17  Yes Constable, Amber, PA-C  naproxen (NAPROSYN) 500 MG tablet Take 500 mg by mouth as needed. 08/19/17  Yes [provider]  Oxcarbazepine (TRILEPTAL) 300 MG tablet Take 300 mg by mouth 3 (three) times daily.   Yes [provider]  polyethylene glycol (MIRALAX / GLYCOLAX) packet Take 17 g by mouth daily as needed for mild constipation. 01/04/17  Yes Rhetta Mura, MD  PROAIR HFA 108 (90 BASE) MCG/ACT inhaler  Inhale 1-2 puffs into the lungs every 4 (four) hours as needed for wheezing or shortness of breath.  04/03/14  Yes [provider]  risperiDONE (RISPERDAL) 1 MG tablet Take 1 mg by mouth 2 (two) times daily.   Yes [provider]  amoxicillin-clavulanate (AUGMENTIN) 875-125 MG tablet Take 1 tablet by mouth every 12 (twelve) hours. Patient not taking: Reported on 08/30/2017 01/04/17   Rhetta Mura, MD  azithromycin (ZITHROMAX) 1 g powder Take 1 packet (1 g total) by mouth once for 1 dose. 08/30/17 08/30/17  Dacey Milberger S, PA-C  cephALEXin (KEFLEX) 500 MG capsule Take 1 capsule (500 mg total) by mouth 2 (two) times daily for 7 days. 08/30/17 09/06/17  Tazia Illescas S, PA-C  senna (SENOKOT) 8.6 MG TABS tablet Take 1 tablet (8.6 mg total) by mouth 2 (two) times daily. Patient not taking: Reported on 08/30/2017 01/04/17   Rhetta Mura, MD  terconazole (TERAZOL 3) 80 MG vaginal suppository Place 1 suppository vaginally daily. 08/30/17   [provider]    Family History No family history on file.  Social History Social History   Tobacco Use  . Smoking status: Current Every Day Smoker    Packs/day: 0.25    Types: Cigarettes, Cigars  . Smokeless tobacco: Never Used  Substance Use Topics  . Alcohol use: Yes    Comment: occ  . Drug use: Yes    Types: Cocaine     Allergies   Tetracyclines & related   Review of Systems Review of Systems  Constitutional: Negative for chills and fever.  HENT: Negative for ear pain and sore throat.   Eyes: Negative for pain and visual disturbance.  Respiratory: Positive for cough and shortness of breath.   Cardiovascular: Positive for chest pain. Negative for leg swelling.  Gastrointestinal: Positive for abdominal pain and nausea. Negative for blood in stool, constipation, diarrhea and vomiting.  Genitourinary: Positive for dysuria, frequency, pelvic pain and vaginal discharge.  Musculoskeletal: Negative for back  pain.       Left left pain and swelling  Skin: Negative for color change and rash.  Neurological: Negative for dizziness, seizures, syncope, weakness, light-headedness, numbness and headaches.  All other systems reviewed and are negative.    Physical Exam Updated Vital Signs BP 116/60   Pulse 67   Temp 98.3 F (36.8 C) (Oral)   Resp 17   Ht 5\' 4"  (1.626 m)   Wt 64.4 kg (142 lb)   SpO2 100%   BMI 24.37 kg/m   Physical Exam  Constitutional: She appears well-developed and well-nourished. No distress.  Nontoxic appearing, no acute distress. Pt is eating a sucker when I enter the room.  HENT:  Head: Normocephalic and atraumatic.  Eyes: Conjunctivae are normal. Pupils are equal, round, and reactive to light.  Neck: Normal range of motion. Neck supple. No JVD present.  Cardiovascular: Normal rate, regular rhythm, intact distal pulses and normal pulses.  No murmur heard. Pulmonary/Chest: Effort  normal and breath sounds normal. No respiratory distress.  Abdominal: Soft. Bowel sounds are normal. She exhibits no distension.  Epigastric tenderness with guarding.  Abdomen is soft, active bowel sounds.  Genitourinary:  Genitourinary Comments: Exam performed by Karrie Meres,  exam chaperoned Date: 08/31/2017 Pelvic exam: normal external genitalia without evidence of trauma. VULVA: normal appearing vulva with no masses, tenderness or lesion. VAGINA: normal appearing vagina with normal color, moderate white/yellow discharge with +odor CERVIX: normal appearing cervix without lesions, cervical motion tenderness present, cervical os closed with out white/yellow discharge, Wet prep and DNA probe for chlamydia and GC obtained.   ADNEXA: normal adnexa in size, nontender and no masses UTERUS: uterus is normal size, shape, consistency and mildy tender.   Musculoskeletal: She exhibits no edema.       Right lower leg: Normal. She exhibits no tenderness and no edema.       Left lower leg: She  exhibits tenderness. She exhibits no edema.  Neurological: She is alert.  Skin: Skin is warm and dry.  Psychiatric: She has a normal mood and affect.  Nursing note and vitals reviewed.    ED Treatments / Results  Labs (all labs ordered are listed, but only abnormal results are displayed) Labs Reviewed  WET PREP, GENITAL - Abnormal; Notable for the following components:      Result Value   WBC, Wet Prep HPF POC MODERATE (*)    All other components within normal limits  BASIC METABOLIC PANEL - Abnormal; Notable for the following components:   Creatinine, Ser 1.15 (*)    Calcium 8.6 (*)    GFR calc non Af Amer 50 (*)    GFR calc Af Amer 58 (*)    All other components within normal limits  CBC - Abnormal; Notable for the following components:   RDW 16.4 (*)    All other components within normal limits  URINALYSIS, ROUTINE W REFLEX MICROSCOPIC - Abnormal; Notable for the following components:   APPearance CLOUDY (*)    Hgb urine dipstick LARGE (*)    Bilirubin Urine MODERATE (*)    Ketones, ur 5 (*)    Protein, ur 30 (*)    Nitrite POSITIVE (*)    Leukocytes, UA LARGE (*)    Bacteria, UA RARE (*)    Squamous Epithelial / LPF 6-30 (*)    Non Squamous Epithelial 0-5 (*)    All other components within normal limits  D-DIMER, QUANTITATIVE (NOT AT [facility]) - Abnormal; Notable for the following components:   D-Dimer, Quant 1.16 (*)    All other components within normal limits  URINE CULTURE  RAPID HIV SCREEN (HIV 1/2 AB+AG)  RPR  I-STAT TROPONIN, ED  I-STAT TROPONIN, ED  GC/CHLAMYDIA PROBE AMP ([place]) NOT AT [facility]    EKG  EKG Interpretation  Date/Time:  Monday August 30 2017 11:01:58 EDT Ventricular Rate:  85 PR Interval:  138 QRS Duration: 68 QT Interval:  354 QTC Calculation: 421 R Axis:   92 Text Interpretation:  Normal sinus rhythm Rightward axis Anterior infarct , age undetermined Abnormal ECG Baseline wander since last tracing no significant change Confirmed by  Mancel Bale [phone]) on 08/30/2017 6:31:41 PM       Radiology Dg Chest 2 View  Result Date: 08/30/2017 CLINICAL DATA:  Chest tightness and shortness of breath for the past 4 days. EXAM: CHEST - 2 VIEW COMPARISON:  Chest x-ray dated May 21, 2017. FINDINGS: The heart size and mediastinal contours are within normal  limits. Both lungs are clear. The visualized skeletal structures are unremarkable. IMPRESSION: No active cardiopulmonary disease. Electronically Signed   By: Obie Dredge M.D.   On: 08/30/2017 11:43   Ct Angio Chest Pe W And/or Wo Contrast  Result Date: 08/30/2017 CLINICAL DATA:  Chest pain and dyspnea. History of DVT in the lower extremities. EXAM: CT ANGIOGRAPHY CHEST WITH CONTRAST TECHNIQUE: Multidetector CT imaging of the chest was performed using the standard protocol during bolus administration of intravenous contrast. Multiplanar CT image reconstructions and MIPs were obtained to evaluate the vascular anatomy. CONTRAST:  ISOVUE-370 IOPAMIDOL (ISOVUE-370) INJECTION 76% COMPARISON:  Same day CXR FINDINGS: Cardiovascular: The study is of quality for the evaluation of pulmonary embolism. There are no filling defects in the central, lobar, segmental or subsegmental pulmonary artery branches to suggest acute pulmonary embolism. Great vessels are normal in course and caliber. Normal heart size. No significant pericardial fluid/thickening. Minimal coronary arteriosclerosis along the LAD. Mediastinum/Nodes: No discrete thyroid nodules. Unremarkable esophagus. No pathologically enlarged axillary, mediastinal or hilar lymph nodes. Lungs/Pleura: Centrilobular and paraseptal emphysema seen in the upper lobes. Noncalcified pulmonary nodules are identified within the right lung and are as follows: 1. Subpleural right upper lobe 6.7 x 4.4 mm nodule, average 5.6 mm. Series 6/32. 2. Subpleural right upper lobe 9.2 x 8.8 mm nodule adjacent to the minor fissure, average 9 mm. Series 6/59. 3.  Slightly elongated right lower lobe medial density, more nodular at its terminus measuring 6.3 x 6.7 mm, average 6.5 mm. Series 6/75 Dependent and subsegmental atelectasis is seen within both lungs. No effusion or pneumothorax. Upper abdomen: Punctate nonobstructing left upper pole renal calculus measuring 3 mm. Musculoskeletal:  No aggressive appearing focal osseous lesions. Review of the MIP images confirms the above findings. IMPRESSION: 1. No acute central pulmonary embolus. 2. Noncalcified right-sided pulmonary nodules ranging size from 5.6 mm through 9 mm in average. Non-contrast chest CT at 3-6 months is recommended. If the nodules are stable at time of repeat CT, then future CT at 18-24 months (from today's scan) is considered optional for low-risk patients, but is recommended for high-risk patients. This recommendation follows the consensus statement: Guidelines for Management of Incidental Pulmonary Nodules Detected on CT Images: From the Fleischner Society 2017; Radiology 2017; 284:228-243. Aortic Atherosclerosis (ICD10-I70.0) and Emphysema (ICD10-J43.9). Electronically Signed   By: Tollie Eth M.D.   On: 08/30/2017 22:18   Ct Abdomen Pelvis W Contrast  Result Date: 08/30/2017 CLINICAL DATA:  Lower abdominal and vaginal pain for 1 week. EXAM: CT ABDOMEN AND PELVIS WITH CONTRAST TECHNIQUE: Multidetector CT imaging of the abdomen and pelvis was performed using the standard protocol following bolus administration of intravenous contrast. CONTRAST:  75mL ISOVUE-300 IOPAMIDOL (ISOVUE-300) INJECTION 61% COMPARISON:  None. FINDINGS: Lower chest: 7.5 mm right middle lobe pulmonary nodule is indeterminate. Recommend full chest CT scan for further evaluation. No other pulmonary nodules are identified. The heart is normal in size. No pericardial effusion. The distal esophagus is grossly normal. Hepatobiliary: No focal hepatic lesions or intrahepatic biliary dilatation. The gallbladder is normal. No common bile  duct dilatation. Pancreas: No mass, inflammation or ductal dilatation. Spleen: Normal size.  No focal lesions. Adrenals/Urinary Tract: Adrenal glands and kidneys are unremarkable. No renal, ureteral or bladder calculi or mass. Stomach/Bowel: The stomach, duodenum, small bowel and colon are grossly normal without oral contrast. No acute inflammatory changes, mass lesions or obstructive findings. The terminal ileum and appendix appear normal. Vascular/Lymphatic: The aorta is normal in caliber. No dissection. The  branch vessels are patent. The major venous structures are patent. No mesenteric or retroperitoneal mass or adenopathy. Small scattered lymph nodes are noted. Reproductive: Poorly visualized due to artifact from the right total hip arthroplasty. Other: No obvious pelvic mass or adenopathy. No free pelvic fluid collections. No inguinal mass or adenopathy. Musculoskeletal: No significant bony findings. Moderate facet disease in the lower lumbar spine with non isthmic spondylolisthesis at L4 and L5. Left total hip arthroplasty noted. No obvious complicating features. IMPRESSION: 1. No acute abdominal/pelvic findings, mass lesions or adenopathy. 2. Limited visualization of the lower pelvis because of a left hip prosthesis. Electronically Signed   By: Rudie Meyer M.D.   On: 08/30/2017 14:46    Procedures Procedures (including critical care time)  Medications Ordered in ED Medications  sodium chloride 0.9 % bolus 500 mL (0 mLs Intravenous Stopped 08/30/17 1322)  iopamidol (ISOVUE-300) 61 % injection (75 mLs  Contrast Given 08/30/17 1321)  azithromycin (ZITHROMAX) powder 1 g (1 g Oral Given 08/30/17 1633)  cefTRIAXone (ROCEPHIN) injection 250 mg (250 mg Intramuscular Given 08/30/17 1631)  pantoprazole (PROTONIX) EC tablet 20 mg (20 mg Oral Given 08/30/17 1940)  lidocaine (PF) (XYLOCAINE) 1 % injection (0.9 mLs  Given 08/30/17 1638)  iopamidol (ISOVUE-370) 76 % injection (100 mLs  Contrast Given 08/30/17  2146)     Initial Impression / Assessment and Plan / ED Course  I have reviewed the triage vital signs and the nursing notes.  Pertinent labs & imaging results that were available during my care of the patient were reviewed by me and considered in my medical decision making (see chart for details).    Discussed pt presentation and exam findings with Dr. Arman Bogus, who personally evaluated the patient and agrees with current workup and plan.   Final Clinical Impressions(s) / ED Diagnoses   Final diagnoses:  Chest pain, unspecified type  Urinary tract infection with hematuria, site unspecified  Exposure to STD  PID (acute pelvic inflammatory disease)  Lung nodules   62 year old female presenting with multiple complaints including chest pain/shortness of breath/cough, abd pain, pelvic pain, dysuria/vaginal discharge. VSS. NAD. Nontoxic appearing. Cardiopulmonary exam WNL. Abd exam with epigastric ttp and guarding. No rigidity. LLE calf ttp, no edema.   With regard to CP/SOB. h/o DVT, calf ttp, and no current anticoagulation, ddimer was ordered and was positive. LE [place] negative for DVT. CXR without active cardiopulmonary disease. No evidence of PNA, PTX, cardiomegaly or widened mediastinum. CTA ordered to r/o PE and was negative. Did show pulmonary nodules and pt made aware and advised to f/u. Delta trop negative and doubt ACS, does not sound cardiac in nature. ECG with NSR HR 85 is unchanged from prior. No ischemic changes.   With regard to abd pain,  CBC without anemia or leukocytosis. BMP with mildly elevated Cr to 1.15 which is consistent with prior. ct abd/pelvis ordered due to epigastric ttp with guarding. Imaging negative and suspect sxs could be due to acid reflux as sxs improved after pepcid and fluids.   With regard to vaginal/urinary sxs. UA positive for infection. Also with hematuria, bilirubin, and TNTC RBC. Bilirubin likely due to hematuria, but will have pt f/u with PCP for follow  of this. Unclear if pt is also having vaginal bleeding, she is postmenopausal and may require further workup if bleeding is truly from vaginal source. . No blood noted in vaginal vault on exam. Wet prep with WBC, but no clue cells suggesting BV. Recent unprotected sex with likely exposure  to STD. Gc/chlam pending. HIV negative. RPR pending.  Tx'ed empirically in ED. Will tx for PID as pt had CMT. Will also tx UTI with keflex. Advised pcp f/u in 1 week. Return to ER precautions discussed. Pt safe for d/c with outpt f/u. All questions answered and pt understands plan.  ED Discharge Orders        Ordered    azithromycin (ZITHROMAX) 1 g powder   Once     08/30/17 1958    cephALEXin (KEFLEX) 500 MG capsule  2 times daily     08/30/17 [address] Tiaira Arambula S, PA-C 08/31/17 0014    Mancel Bale, MD 09/01/17 [phone]

## 2017-08-30 NOTE — Discharge Instructions (Addendum)
You were given a prescription for 2 antibiotics.  You will need to take azithromycin in 1 week on 09/06/2017.  You were also given a prescription for Keflex which you need to take 2 times daily for the next 7 days.  If you have any reactions from these medications that you cannot tolerate and you should stop taking them.  You should check with your pharmacist to make sure that none of these medications interact with current medications that you are already on.  You were found to have nodules on your lungs on the CT scan, and you should follow-up with your primary care doctor for further evaluation of these nodules.  You will need to return to the emergency department if you continue to have chest pain, shortness of breath, persistent abdominal pain, fevers, or any new or worsening symptoms.

## 2017-08-30 NOTE — ED Provider Notes (Signed)
  Face-to-face evaluation   History: She presents for evaluation of shortness of breath, vaginal bleeding, burning with urination, and unprotected sex.  Physical exam: Alert female, appears older than stated age.  Nontoxic appearance.  Lungs-no respiratory distress.  Abdomen, soft and nontender.   Medical screening examination/treatment/procedure(s) were conducted as a shared visit with non-physician practitioner(s) and myself.  I personally evaluated the patient during the encounter    Mancel Bale, MD 09/01/17 [phone]

## 2017-08-30 NOTE — ED Triage Notes (Signed)
PT states chest tightness and sob x 4 days and vaginal bleeding, burning with urination and vaginal discharge x 2 days.  States 1 new sexual partner that she has had unprotected sex with.

## 2017-08-30 NOTE — ED Notes (Signed)
Back from xray

## 2017-08-30 NOTE — ED Notes (Signed)
Pelvic cart at bedside. 

## 2017-08-30 NOTE — ED Notes (Signed)
Walked patient to the bathroom patient did well 

## 2017-08-30 NOTE — ED Notes (Signed)
Patient given discharge instructions and verbalized understanding.  Patient stable to discharge at this time.  Patient is alert and oriented to baseline.  No distressed noted at this time.  All belongings taken with the patient at discharge.   

## 2017-08-30 NOTE — ED Notes (Signed)
Hooked patient back up to the monitor after going to the bathroom patient is resting with call bell in reach

## 2017-08-30 NOTE — ED Notes (Signed)
Left for xray

## 2017-08-31 LAB — GC/CHLAMYDIA PROBE AMP (~~LOC~~) NOT AT ARMC
Chlamydia: POSITIVE — AB
NEISSERIA GONORRHEA: NEGATIVE

## 2017-08-31 LAB — RPR: RPR: NONREACTIVE

## 2017-09-01 LAB — URINE CULTURE

## 2017-09-02 ENCOUNTER — Telehealth: Payer: Self-pay | Admitting: *Deleted

## 2017-09-02 NOTE — Telephone Encounter (Signed)
Post ED Visit - Positive Culture Follow-up  Culture report reviewed by antimicrobial stewardship pharmacist:  []  , Pharm.D. []  Enzo Bi, Pharm.D., BCPS AQ-ID []  , Pharm.D., BCPS [x]  Celedonio Miyamoto, .D., BCPS []  [place], .D., BCPS, AAHIVP []  Georgina Pillion, Pharm.D., BCPS, AAHIVP []  [address], PharmD, BCPS []  , PharmD []  [place], PharmD, BCPS  Positive urine culture Treated with cephalexin, organism sensitive to the same and no further patient follow-up is required at this time.  [address] [facility] 09/02/2017, 10:08 AM

## 2017-09-28 ENCOUNTER — Encounter: Payer: Self-pay | Admitting: Nurse Practitioner

## 2018-02-01 ENCOUNTER — Observation Stay (HOSPITAL_COMMUNITY): Payer: Medicare Other

## 2018-02-01 ENCOUNTER — Encounter (HOSPITAL_COMMUNITY): Payer: Self-pay | Admitting: *Deleted

## 2018-02-01 ENCOUNTER — Observation Stay (HOSPITAL_COMMUNITY)
Admission: EM | Admit: 2018-02-01 | Discharge: 2018-02-03 | Disposition: A | Discharge status: Home / Self Care | Payer: Medicare Other | Attending: Internal Medicine | Admitting: Internal Medicine

## 2018-02-01 ENCOUNTER — Other Ambulatory Visit: Payer: Self-pay

## 2018-02-01 ENCOUNTER — Emergency Department (HOSPITAL_COMMUNITY): Payer: Medicare Other

## 2018-02-01 DIAGNOSIS — N760 Acute vaginitis: Secondary | ICD-10-CM | POA: Diagnosis not present

## 2018-02-01 DIAGNOSIS — F1410 Cocaine abuse, uncomplicated: Secondary | ICD-10-CM | POA: Diagnosis present

## 2018-02-01 DIAGNOSIS — Z7982 Long term (current) use of aspirin: Secondary | ICD-10-CM | POA: Diagnosis not present

## 2018-02-01 DIAGNOSIS — M1990 Unspecified osteoarthritis, unspecified site: Secondary | ICD-10-CM | POA: Insufficient documentation

## 2018-02-01 DIAGNOSIS — Z8759 Personal history of other complications of pregnancy, childbirth and the puerperium: Secondary | ICD-10-CM | POA: Diagnosis not present

## 2018-02-01 DIAGNOSIS — I700 Atherosclerosis of aorta: Secondary | ICD-10-CM | POA: Insufficient documentation

## 2018-02-01 DIAGNOSIS — R55 Syncope and collapse: Secondary | ICD-10-CM | POA: Diagnosis present

## 2018-02-01 DIAGNOSIS — Z96642 Presence of left artificial hip joint: Secondary | ICD-10-CM | POA: Insufficient documentation

## 2018-02-01 DIAGNOSIS — Z9889 Other specified postprocedural states: Secondary | ICD-10-CM | POA: Insufficient documentation

## 2018-02-01 DIAGNOSIS — B9689 Other specified bacterial agents as the cause of diseases classified elsewhere: Secondary | ICD-10-CM | POA: Diagnosis present

## 2018-02-01 DIAGNOSIS — F250 Schizoaffective disorder, bipolar type: Secondary | ICD-10-CM | POA: Insufficient documentation

## 2018-02-01 DIAGNOSIS — Z79899 Other long term (current) drug therapy: Secondary | ICD-10-CM | POA: Diagnosis not present

## 2018-02-01 DIAGNOSIS — Z881 Allergy status to other antibiotic agents status: Secondary | ICD-10-CM | POA: Insufficient documentation

## 2018-02-01 DIAGNOSIS — Z7951 Long term (current) use of inhaled steroids: Secondary | ICD-10-CM | POA: Insufficient documentation

## 2018-02-01 DIAGNOSIS — F17210 Nicotine dependence, cigarettes, uncomplicated: Secondary | ICD-10-CM | POA: Insufficient documentation

## 2018-02-01 DIAGNOSIS — R079 Chest pain, unspecified: Secondary | ICD-10-CM | POA: Insufficient documentation

## 2018-02-01 DIAGNOSIS — R918 Other nonspecific abnormal finding of lung field: Secondary | ICD-10-CM | POA: Diagnosis not present

## 2018-02-01 LAB — BASIC METABOLIC PANEL
Anion gap: 12 (ref 5–15)
BUN: 15 mg/dL (ref 8–23)
CALCIUM: 9.1 mg/dL (ref 8.9–10.3)
CO2: 22 mmol/L (ref 22–32)
Chloride: 106 mmol/L (ref 98–111)
Creatinine, Ser: 0.91 mg/dL (ref 0.44–1.00)
GFR calc Af Amer: >60 mL/min (ref >60)
GLUCOSE: 98 mg/dL (ref 70–99)
Potassium: 4 mmol/L (ref 3.5–5.1)
SODIUM: 140 mmol/L (ref 135–145)

## 2018-02-01 LAB — I-STAT TROPONIN, ED: TROPONIN I, POC: 0 ng/mL (ref 0.00–0.08)

## 2018-02-01 LAB — CBC
HCT: 42.7 % (ref 36.0–46.0)
Hemoglobin: 14.1 g/dL (ref 12.0–15.0)
MCH: 31.2 pg (ref 26.0–34.0)
MCHC: 33 g/dL (ref 30.0–36.0)
MCV: 94.5 fL (ref 78.0–100.0)
PLATELETS: 187 10*3/uL (ref 150–400)
RBC: 4.52 MIL/uL (ref 3.87–5.11)
RDW: 16.8 % — AB (ref 11.5–15.5)
WBC: 4.8 10*3/uL (ref 4.0–10.5)

## 2018-02-01 LAB — WET PREP, GENITAL
Sperm: NONE SEEN
Trich, Wet Prep: NONE SEEN
Yeast Wet Prep HPF POC: NONE SEEN

## 2018-02-01 LAB — URINALYSIS, ROUTINE W REFLEX MICROSCOPIC
Bilirubin Urine: NEGATIVE
Glucose, UA: NEGATIVE mg/dL
Hgb urine dipstick: NEGATIVE
KETONES UR: NEGATIVE mg/dL
LEUKOCYTES UA: NEGATIVE
NITRITE: NEGATIVE
Protein, ur: NEGATIVE mg/dL
Specific Gravity, Urine: 1.014 (ref 1.005–1.030)
pH: 6 (ref 5.0–8.0)

## 2018-02-01 LAB — TROPONIN I: Troponin I: <0.03 ng/mL (ref <0.03)

## 2018-02-01 LAB — LIPASE, BLOOD: Lipase: 29 U/L (ref 11–51)

## 2018-02-01 MED ORDER — ALBUTEROL SULFATE (2.5 MG/3ML) 0.083% IN NEBU: 2.5000 mg | INHALATION_SOLUTION | Freq: Four times a day (QID) | RESPIRATORY_TRACT | Status: DC | PRN

## 2018-02-01 MED ORDER — CLONAZEPAM 0.5 MG PO TABS: 0.5000 mg | ORAL_TABLET | Freq: Two times a day (BID) | ORAL | Status: DC | PRN

## 2018-02-01 MED ORDER — NICOTINE 21 MG/24HR TD PT24
21.0000 mg | MEDICATED_PATCH | Freq: Every day | TRANSDERMAL | Status: DC
  Administered 2018-02-02 – 2018-02-03 (×2): 21 mg via TRANSDERMAL
  Filled 2018-02-01 (×2): qty 1

## 2018-02-01 MED ORDER — METRONIDAZOLE 500 MG PO TABS
500.0000 mg | ORAL_TABLET | Freq: Once | ORAL | Status: AC
  Administered 2018-02-01: 500 mg via ORAL
  Filled 2018-02-01: qty 1

## 2018-02-01 MED ORDER — NAPROXEN 250 MG PO TABS
500.0000 mg | ORAL_TABLET | Freq: Three times a day (TID) | ORAL | Status: DC | PRN
  Administered 2018-02-01: 500 mg via ORAL
  Filled 2018-02-01 (×2): qty 2

## 2018-02-01 MED ORDER — GABAPENTIN 300 MG PO CAPS
300.0000 mg | ORAL_CAPSULE | Freq: Three times a day (TID) | ORAL | Status: DC
  Administered 2018-02-01 – 2018-02-03 (×5): 300 mg via ORAL
  Filled 2018-02-01 (×5): qty 1

## 2018-02-01 MED ORDER — METRONIDAZOLE 500 MG PO TABS
500.0000 mg | ORAL_TABLET | Freq: Two times a day (BID) | ORAL | Status: DC
  Administered 2018-02-02 – 2018-02-03 (×3): 500 mg via ORAL
  Filled 2018-02-01 (×3): qty 1

## 2018-02-01 MED ORDER — SODIUM CHLORIDE 0.9% FLUSH
3.0000 mL | Freq: Two times a day (BID) | INTRAVENOUS | Status: DC
  Administered 2018-02-02 – 2018-02-03 (×2): 3 mL via INTRAVENOUS

## 2018-02-01 MED ORDER — ASPIRIN EC 325 MG PO TBEC
325.0000 mg | DELAYED_RELEASE_TABLET | Freq: Two times a day (BID) | ORAL | Status: DC
  Administered 2018-02-02 – 2018-02-03 (×3): 325 mg via ORAL
  Filled 2018-02-01 (×3): qty 1

## 2018-02-01 MED ORDER — FLUTICASONE PROPIONATE 50 MCG/ACT NA SUSP: 1.0000 | Freq: Every day | NASAL | Status: DC | PRN

## 2018-02-01 MED ORDER — ENOXAPARIN SODIUM 40 MG/0.4ML ~~LOC~~ SOLN
40.0000 mg | SUBCUTANEOUS | Status: DC
  Administered 2018-02-02 – 2018-02-03 (×2): 40 mg via SUBCUTANEOUS
  Filled 2018-02-01 (×3): qty 0.4

## 2018-02-01 MED ORDER — ADULT MULTIVITAMIN W/MINERALS CH
1.0000 | ORAL_TABLET | Freq: Every day | ORAL | Status: DC
  Administered 2018-02-01 – 2018-02-03 (×3): 1 via ORAL
  Filled 2018-02-01 (×3): qty 1

## 2018-02-01 MED ORDER — RISPERIDONE 1 MG PO TABS
1.0000 mg | ORAL_TABLET | Freq: Two times a day (BID) | ORAL | Status: DC
  Administered 2018-02-01 – 2018-02-03 (×4): 1 mg via ORAL
  Filled 2018-02-01 (×4): qty 1

## 2018-02-01 MED ORDER — OXCARBAZEPINE 300 MG PO TABS
300.0000 mg | ORAL_TABLET | Freq: Three times a day (TID) | ORAL | Status: DC
  Administered 2018-02-01 – 2018-02-03 (×5): 300 mg via ORAL
  Filled 2018-02-01 (×6): qty 1

## 2018-02-01 MED ORDER — MOMETASONE FURO-FORMOTEROL FUM 200-5 MCG/ACT IN AERO
2.0000 | INHALATION_SPRAY | Freq: Two times a day (BID) | RESPIRATORY_TRACT | Status: DC
  Administered 2018-02-02 (×2): 2 via RESPIRATORY_TRACT
  Filled 2018-02-01: qty 8.8

## 2018-02-01 NOTE — ED Triage Notes (Signed)
Pt in c/o chest pain and abdominal pain, states she had palpations yesterday, abdominal pain for two weeks with vaginal discharge

## 2018-02-01 NOTE — ED Provider Notes (Signed)
[facility] EMERGENCY DEPARTMENT Provider Note   CSN: [id number] Arrival date & time: 02/01/18  1152     History   Chief Complaint Chief Complaint  Patient presents with  . Abdominal Pain  . Chest Pain    HPI Terry Potter is a 62 y.o. female.  62 year old female presents with syncope x2.  Had one episode a week ago when she was doing crack cocaine.  Denies any chest pain associated with that.  Was also [place].  Patient possibly has seizure activity then as well as happened again today when she was sitting down and awoke after having a another syncopal episode.  Denies any prodromal symptoms.  States that she may have bit her tongue.  Denied any loss of bowel or bladder function.  Possible postictal phase.  Patient also notes epigastric discomfort which is been nonexertional and rating to her chest without diaphoresis or dyspnea.  She also notes vaginal discharge as well as protected sex.  Denies any dysuria.  States that her abdominal discomfort is not related to her vaginal discharge.  No fever chills or vomiting.  No flank pain.     Past Medical History:  Diagnosis Date  . Arthritis   . Bipolar disorder (HCC)   . Ectopic pregnancy     Patient Active Problem List   Diagnosis Date Noted  . Hip fracture, unspecified laterality, closed, initial encounter (HCC) 01/01/2017  . Hip fracture (HCC) 01/01/2017  . Displaced fracture of left femoral neck (HCC) 01/01/2017  . Schizoaffective disorder, bipolar type (HCC) 03/19/2015    Class: Chronic    Past Surgical History:  Procedure Laterality Date  . ABDOMINAL SURGERY    . 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 TUMOR REMOVAL     Uterine tumor removed     OB History   None      Home Medications    Prior to Admission medications   Medication Sig  Start Date End Date Taking? Authorizing Provider  aspirin EC 325 MG EC tablet Take 1 tablet (325 mg total) by mouth 2 (two) times daily with a meal. 01/03/17   Constable, Amber, PA-C  clonazePAM (KLONOPIN) 0.5 MG tablet Take 0.5 mg by mouth as needed. 08/03/17   [provider]  fluticasone (FLONASE) 50 MCG/ACT nasal spray Place 1 spray into both nostrils daily as needed for rhinitis.    [provider]  gabapentin (NEURONTIN) 300 MG capsule Take 300 mg by mouth 3 (three) times daily.    [provider]  HYDROcodone-acetaminophen (NORCO/VICODIN) 5-325 MG tablet Take 1-2 tablets by mouth every 6 (six) hours as needed for moderate pain. 01/03/17   Constable, Amber, PA-C  naproxen (NAPROSYN) 500 MG tablet Take 500 mg by mouth as needed. 08/19/17   [provider]  Oxcarbazepine (TRILEPTAL) 300 MG tablet Take 300 mg by mouth 3 (three) times daily.    [provider]  polyethylene glycol (MIRALAX / GLYCOLAX) packet Take 17 g by mouth daily as needed for mild constipation. 01/04/17   Rhetta Mura, MD  PROAIR HFA 108 (90 BASE) MCG/ACT inhaler Inhale 1-2 puffs into the lungs every 4 (four) hours as needed for wheezing or shortness of breath.  04/03/14   [provider]  risperiDONE (RISPERDAL) 1 MG tablet Take 1 mg by mouth 2 (two) times daily.    [provider]  senna (SENOKOT) 8.6 MG TABS tablet Take 1 tablet (8.6 mg total) by mouth 2 (two) times daily. Patient not taking: Reported on 08/30/2017 01/04/17   Rhetta Mura, MD  terconazole (TERAZOL 3) 80 MG vaginal suppository Place 1 suppository vaginally daily. 08/30/17   [provider]    Family History History reviewed. No pertinent family history.  Social History Social History   Tobacco Use  . Smoking status: Current Every Day Smoker    Packs/day: 0.25    Types: Cigarettes, Cigars  . Smokeless tobacco: Never Used  Substance Use Topics  . Alcohol use: Yes     Comment: occ  . Drug use: Yes    Types: Cocaine     Allergies   Tetracyclines & related   Review of Systems Review of Systems  All other systems reviewed and are negative.    Physical Exam Updated Vital Signs BP 123/77   Pulse 72   Temp 98.5 F (36.9 C) (Oral)   Resp (!) 24   SpO2 98%   Physical Exam  Constitutional: She is oriented to person, place, and time. She appears well-developed and well-nourished.  Non-toxic appearance. No distress.  HENT:  Head: Normocephalic and atraumatic.  Eyes: Pupils are equal, round, and reactive to light. Conjunctivae, EOM and lids are normal.  Neck: Normal range of motion. Neck supple. No tracheal deviation present. No thyroid mass present.  Cardiovascular: Normal rate, regular rhythm and normal heart sounds. Exam reveals no gallop.  No murmur heard. Pulmonary/Chest: Effort normal and breath sounds normal. No stridor. No respiratory distress. She has no decreased breath sounds. She has no wheezes. She has no rhonchi. She has no rales.  Abdominal: Soft. Normal appearance and bowel sounds are normal. She exhibits no distension. There is no tenderness. There is no rebound and no CVA tenderness.  Genitourinary: No tenderness or bleeding in the vagina. No vaginal discharge found.  Musculoskeletal: Normal range of motion. She exhibits no edema or tenderness.  Neurological: She is alert and oriented to person, place, and time. She has normal strength. No cranial nerve deficit or sensory deficit. GCS eye subscore is 4. GCS verbal subscore is 5. GCS motor subscore is 6.  Skin: Skin is warm and dry. No abrasion and no rash noted.  Psychiatric: She has a normal mood and affect. Her speech is normal and behavior is normal.  Nursing note and vitals reviewed.    ED Treatments / Results  Labs (all labs ordered are listed, but only abnormal results are displayed) Labs Reviewed  CBC - Abnormal; Notable for the following components:      Result Value     RDW 16.8 (*)    All other components within normal limits  WET PREP, GENITAL  BASIC METABOLIC PANEL  LIPASE, BLOOD  RPR  HIV ANTIBODY (ROUTINE TESTING)  I-STAT TROPONIN, ED  GC/CHLAMYDIA PROBE AMP ([place]) NOT AT [facility]    EKG EKG Interpretation  Date/Time:  Tuesday February 01 2018 12:04:04 EDT Ventricular Rate:  92 PR Interval:  134 QRS Duration: 68 QT Interval:  350 QTC Calculation: 432 R Axis:   90 Text Interpretation:  Normal sinus rhythm Rightward axis Anterior infarct , age undetermined Abnormal ECG No significant change since last tracing Confirmed by Lorre Nick ([id number]) on 02/01/2018 3:58:50 PM   Radiology Dg Chest 2 View  Result Date: 02/01/2018 CLINICAL DATA:  Chest pain EXAM: CHEST - 2 VIEW COMPARISON:  CT chest 08/30/2017 FINDINGS: The heart size and mediastinal contours  are within normal limits. Both lungs are clear. The visualized skeletal structures are unremarkable. IMPRESSION: No active cardiopulmonary disease. Electronically Signed   By: Elige Ko   On: 02/01/2018 12:37    Procedures Procedures (including critical care time)  Medications Ordered in ED Medications - No data to display   Initial Impression / Assessment and Plan / ED Course  I have reviewed the triage vital signs and the nursing notes.  Pertinent labs & imaging results that were available during my care of the patient were reviewed by me and considered in my medical decision making (see chart for details).     Presents after having 2 syncopal episodes.  No prodromal.  Prior to the events.  Questionable seizure activity but head CT negative.  Patient states one episode about a week ago and happen after she had used cocaine.  She had no prodromal symptoms.  Second 1 happened again yesterday.  Patient had another complaint of having chest discomfort but she says this began over several months is more like a chest soreness that is associated with increased stress and anxiety.  She also  has evidence of bacterial vaginosis.  Because of her recurrent syncope will consult hospitalist for admission  Final Clinical Impressions(s) / ED Diagnoses   Final diagnoses:  None    ED Discharge Orders    None       Lorre Nick, MD 02/01/18 1911

## 2018-02-01 NOTE — ED Notes (Signed)
Patient transported to CT 

## 2018-02-01 NOTE — H&P (Signed)
History and Physical    Terry Potter LPF:[id number] DOB: 1955/07/05 DOA: 02/01/2018  PCP: Gilda Crease, MD  Patient coming from: Home  I have personally briefly reviewed patient's old medical records in [facility]  Chief Complaint: Syncope  HPI: Terry Potter is a 62 y.o. female with medical history significant of ongoing cocaine abuse.  Patient presents with 2 episodes of syncope.  First episode a week ago while doing crack cocaine.  No CP associated with that episode.  Patient possibly has seizure activity then as well as happened again today when she was sitting down and awoke after having a another syncopal episode.  Denies any prodromal symptoms.  States that she may have bit her tongue.  Denied any loss of bowel or bladder function.  Possible postictal phase.  Patient also notes epigastric discomfort which is been nonexertional and rating to her chest without diaphoresis or dyspnea.   ED Course: EKG unchanged, trop neg.  Also has BV diagnosed by EDP.   Review of Systems: As per HPI otherwise 10 point review of systems negative.   Past Medical History:  Diagnosis Date  . Arthritis   . Bipolar disorder (HCC)   . Ectopic pregnancy     Past Surgical History:  Procedure Laterality Date  . ABDOMINAL SURGERY    . 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 TUMOR REMOVAL     Uterine tumor removed     reports that she has been smoking cigarettes and cigars. She has been smoking about 0.25 packs per day. She has never used smokeless tobacco. She reports that she drinks alcohol. She reports that she has current or past drug history. Drug: Cocaine.  Allergies  Allergen Reactions  . Tetracyclines & Related Hives    Family History  Problem Relation Age of Onset  . Cancer Mother      Prior to Admission  medications   Medication Sig Start Date End Date Taking? Authorizing Provider  albuterol (PROAIR HFA) 108 (90 Base) MCG/ACT inhaler Inhale 2 puffs into the lungs every 6 (six) hours as needed for wheezing or shortness of breath.   Yes [provider]  aspirin EC 325 MG EC tablet Take 1 tablet (325 mg total) by mouth 2 (two) times daily with a meal. 01/03/17  Yes Constable, Amber, PA-C  budesonide-formoterol (SYMBICORT) 160-4.5 MCG/ACT inhaler Inhale 2 puffs into the lungs 2 (two) times daily.   Yes [provider]  clonazePAM (KLONOPIN) 0.5 MG tablet Take 0.5 mg by mouth 2 (two) times daily as needed for anxiety.  08/03/17  Yes [provider]  fluticasone (FLONASE) 50 MCG/ACT nasal spray Place 1 spray into both nostrils daily as needed for rhinitis.   Yes [provider]  gabapentin (NEURONTIN) 300 MG capsule Take 300 mg by mouth 3 (three) times daily.   Yes [provider]  Multiple Vitamin (MULTIVITAMIN WITH MINERALS) TABS tablet Take 1 tablet by mouth daily.   Yes [provider]  naproxen (NAPROSYN) 500 MG tablet Take 500 mg by mouth 3 (three) times daily as needed (pain).  08/19/17  Yes [provider]  nicotine (NICODERM CQ - DOSED IN MG/24 HOURS) 21 mg/24hr patch Place 21 mg onto the skin daily.   Yes [provider]  Oxcarbazepine (TRILEPTAL) 300 MG tablet Take 300 mg by mouth 3 (three) times daily.  Yes [provider]  risperiDONE (RISPERDAL) 1 MG tablet Take 1 mg by mouth 2 (two) times daily.   Yes [provider]  HYDROcodone-acetaminophen (NORCO/VICODIN) 5-325 MG tablet Take 1-2 tablets by mouth every 6 (six) hours as needed for moderate pain. 01/03/17   Constable, Amber, PA-C  polyethylene glycol (MIRALAX / GLYCOLAX) packet Take 17 g by mouth daily as needed for mild constipation. 01/04/17   Rhetta Mura, MD  PROAIR HFA 108 (90 BASE) MCG/ACT inhaler Inhale 1-2 puffs into the lungs every 4 (four)  hours as needed for wheezing or shortness of breath.  04/03/14   [provider]  senna (SENOKOT) 8.6 MG TABS tablet Take 1 tablet (8.6 mg total) by mouth 2 (two) times daily. Patient not taking: Reported on 08/30/2017 01/04/17   Rhetta Mura, MD  terconazole (TERAZOL 3) 80 MG vaginal suppository Place 1 suppository vaginally daily. 08/30/17   [provider]    Physical Exam: Vitals:   02/01/18 1630 02/01/18 1700 02/01/18 1730 02/01/18 1903  BP: (!) 133/55 (!) 102/58 110/68 99/75  Pulse: 85 64  77  Resp: 17 14 18 20   Temp:      TempSrc:      SpO2: 96% 98% 99% 95%    Constitutional: NAD, calm, comfortable Eyes: PERRL, lids and conjunctivae normal ENMT: Mucous membranes are moist. Posterior pharynx clear of any exudate or lesions.Normal dentition.  Neck: normal, supple, no masses, no thyromegaly Respiratory: clear to auscultation bilaterally, no wheezing, no crackles. Normal respiratory effort. No accessory muscle use.  Cardiovascular: Regular rate and rhythm, no murmurs / rubs / gallops. No extremity edema. 2+ pedal pulses. No carotid bruits.  Abdomen: no tenderness, no masses palpated. No hepatosplenomegaly. Bowel sounds positive.  Musculoskeletal: no clubbing / cyanosis. No joint deformity upper and lower extremities. Good ROM, no contractures. Normal muscle tone.  Skin: no rashes, lesions, ulcers. No induration Neurologic: CN 2-12 grossly intact. Sensation intact, DTR normal. Strength 5/5 in all 4.  Psychiatric: Normal judgment and insight. Alert and oriented x 3. Normal mood.    Labs on Admission: I have personally reviewed following labs and imaging studies  CBC: Recent Labs  Lab 02/01/18 1206  WBC 4.8  HGB 14.1  HCT 42.7  MCV 94.5  PLT 187   Basic Metabolic Panel: Recent Labs  Lab 02/01/18 1206  NA 140  K 4.0  CL 106  CO2 22  GLUCOSE 98  BUN 15  CREATININE 0.91  CALCIUM 9.1   GFR: CrCl cannot be calculated (Unknown ideal  weight.). Liver Function Tests: No results for input(s): AST, ALT, ALKPHOS, BILITOT, PROT, ALBUMIN in the last 168 hours. Recent Labs  Lab 02/01/18 1206  LIPASE 29   No results for input(s): AMMONIA in the last 168 hours. Coagulation Profile: No results for input(s): INR, PROTIME in the last 168 hours. Cardiac Enzymes: No results for input(s): CKTOTAL, CKMB, CKMBINDEX, TROPONINI in the last 168 hours. BNP (last 3 results) No results for input(s): PROBNP in the last 8760 hours. HbA1C: No results for input(s): HGBA1C in the last 72 hours. CBG: No results for input(s): GLUCAP in the last 168 hours. Lipid Profile: No results for input(s): CHOL, HDL, LDLCALC, TRIG, CHOLHDL, LDLDIRECT in the last 72 hours. Thyroid Function Tests: No results for input(s): TSH, T4TOTAL, FREET4, T3FREE, THYROIDAB in the last 72 hours. Anemia Panel: No results for input(s): VITAMINB12, FOLATE, FERRITIN, TIBC, IRON, RETICCTPCT in the last 72 hours. Urine analysis:    Component Value Date/Time  COLORURINE YELLOW 02/01/2018 1612   APPEARANCEUR CLEAR 02/01/2018 1612   LABSPEC 1.014 02/01/2018 1612   PHURINE 6.0 02/01/2018 1612   GLUCOSEU NEGATIVE 02/01/2018 1612   HGBUR NEGATIVE 02/01/2018 1612   BILIRUBINUR NEGATIVE 02/01/2018 1612   KETONESUR NEGATIVE 02/01/2018 1612   PROTEINUR NEGATIVE 02/01/2018 1612   UROBILINOGEN 1.0 12/16/2014 1325   NITRITE NEGATIVE 02/01/2018 1612   LEUKOCYTESUR NEGATIVE 02/01/2018 1612    Radiological Exams on Admission: Dg Chest 2 View  Result Date: 02/01/2018 CLINICAL DATA:  Chest pain EXAM: CHEST - 2 VIEW COMPARISON:  CT chest 08/30/2017 FINDINGS: The heart size and mediastinal contours are within normal limits. Both lungs are clear. The visualized skeletal structures are unremarkable. IMPRESSION: No active cardiopulmonary disease. Electronically Signed   By: Elige Ko   On: 02/01/2018 12:37   Ct Head Wo Contrast  Result Date: 02/01/2018 CLINICAL DATA:  Recurrent  syncopal episodes. EXAM: CT HEAD WITHOUT CONTRAST TECHNIQUE: Contiguous axial images were obtained from the base of the skull through the vertex without intravenous contrast. COMPARISON:  Head CT scan 01/01/2017. FINDINGS: Brain: No evidence of acute infarction, hemorrhage, hydrocephalus, extra-axial collection or mass lesion/mass effect. Vascular: Atherosclerosis noted. Skull: Intact.  No focal lesion. Sinuses/Orbits: Negative. Other: None. IMPRESSION: No acute abnormality. Atherosclerosis. Electronically Signed   By: Drusilla Kanner M.D.   On: 02/01/2018 17:20    EKG: Independently reviewed.  Assessment/Plan Principal Problem:   Syncope Active Problems:   Bacterial vaginosis   Cocaine abuse (HCC)   Pulmonary nodules    1. Syncope - with cocaine use, also episodic chest pain 1. Syncope pathway 2. Serial trops 3. Tele monitor 4. 2d echo 2. Cocaine abuse - 1. Discussed concern that cocaine may be causing CP, syncope, and/or other heart damage with patient, recommended strongly to quit! 3. Tobacco abuse - 1. Nicotine patch 4. Pulmonary nodules - 1. Due for repeat CT chest, will go ahead and get this ordered 5. BV - Flagyl 500mg  PO BID  DVT prophylaxis: Lovenox Code Status: Full Family Communication: No family in room Disposition Plan: Home after admit Consults called: None Admission status: Place in obs   GARDNER, JARED M. DO [facility] Pager [phone] Only works nights!  If 7AM-7PM, please contact the primary day team physician taking care of patient  [email or web address] Password [facility]  02/01/2018, 7:57 PM

## 2018-02-02 ENCOUNTER — Observation Stay (HOSPITAL_COMMUNITY): Payer: Medicare Other

## 2018-02-02 DIAGNOSIS — R55 Syncope and collapse: Secondary | ICD-10-CM

## 2018-02-02 DIAGNOSIS — N760 Acute vaginitis: Secondary | ICD-10-CM | POA: Diagnosis not present

## 2018-02-02 DIAGNOSIS — F1410 Cocaine abuse, uncomplicated: Secondary | ICD-10-CM

## 2018-02-02 DIAGNOSIS — R918 Other nonspecific abnormal finding of lung field: Secondary | ICD-10-CM

## 2018-02-02 DIAGNOSIS — B9689 Other specified bacterial agents as the cause of diseases classified elsewhere: Secondary | ICD-10-CM

## 2018-02-02 LAB — GC/CHLAMYDIA PROBE AMP (~~LOC~~) NOT AT ARMC
Chlamydia: NEGATIVE
Neisseria Gonorrhea: NEGATIVE

## 2018-02-02 LAB — TROPONIN I

## 2018-02-02 LAB — RPR: RPR Ser Ql: NONREACTIVE

## 2018-02-02 LAB — HIV ANTIBODY (ROUTINE TESTING W REFLEX): HIV Screen 4th Generation wRfx: NONREACTIVE

## 2018-02-02 NOTE — Progress Notes (Signed)
Patient refused bed alarm. Will continue to monitor patient. 

## 2018-02-02 NOTE — Progress Notes (Addendum)
PROGRESS NOTE        PATIENT DETAILS Name: Terry Potter Age: 62 y.o. Sex: female Date of Birth: 1956/06/05 Admit Date: 02/01/2018 Admitting Physician Hillary Bow, DO LGX:[id number], Duke Salvia, MD  Brief Narrative: Patient is a 62 y.o. female with history of bipolar disorder-who presented to the hospital for evaluation of 2 spots where she claims she lost consciousness (?  Syncope).  Patient acknowledges using cocaine in excessive alcohol last week-she was celebrating her birthday.  Subsequently admitted for further evaluation and treatment.    Subjective: No chest pain or shortness of breath.   Assessment/Plan: ?Syncope: Unsure whether these are actually syncopal episodes-patient describes these episodes where she is sitting and then wakes up 15 minutes later-she does not have recollection of these events but is not sure whether she actually just went to sleep and dozed of-she has not lost posture and still sitting when she comes through.  Denies urinary incontinence or tongue bite.  Denies any confusion after these events.  She claims that she and her friend had a similar episode last week.  She has no prior history of syncope, she has no prior cardiac history.  These so-called syncopal episodes were in a setting of excessive cocaine and alcohol use last week.  Telemetry without any arrhythmias-EEG negative for seizures, CT head without any acute abnormalities.  Await echocardiogram-continue telemetry monitoring.  If work-up unrevealing, probably will need outpatient event monitor/Holter monitor if these episodes continue.  Cocaine use: Counseled  Bipolar disorder: Appears to be stable and at baseline-continue Risperdal, Trileptal and Klonopin.  Bacterial vaginosis: Continue metronidazole  History of pulmonary nodules: Awaiting repeat CT chest.  DVT Prophylaxis: Prophylactic Lovenox   Code Status: Full code  Family Communication: None at  bedside  Disposition Plan: Remain inpatient-home when work-up complete.  Antimicrobial agents: Anti-infectives (From admission, onward)   Start     Dose/Rate Route Frequency Ordered Stop   02/02/18 1000  metroNIDAZOLE (FLAGYL) tablet 500 mg     500 mg Oral 2 times daily 02/01/18 1935     02/01/18 1915  metroNIDAZOLE (FLAGYL) tablet 500 mg     500 mg Oral  Once 02/01/18 1912 02/01/18 2014      Procedures: None  CONSULTS:  None  Time spent: 25- minutes-Greater than 50% of this time was spent in counseling, explanation of diagnosis, planning of further management, and coordination of care.  MEDICATIONS: Scheduled Meds: . aspirin  325 mg Oral BID WC  . enoxaparin (LOVENOX) injection  40 mg Subcutaneous Q24H  . gabapentin  300 mg Oral TID  . metroNIDAZOLE  500 mg Oral BID  . mometasone-formoterol  2 puff Inhalation BID  . multivitamin with minerals  1 tablet Oral Daily  . nicotine  21 mg Transdermal Daily  . Oxcarbazepine  300 mg Oral TID  . risperiDONE  1 mg Oral BID  . sodium chloride flush  3 mL Intravenous Q12H   Continuous Infusions: PRN Meds:.albuterol, clonazePAM, naproxen   PHYSICAL EXAM: Vital signs: Vitals:   02/02/18 0423 02/02/18 0900 02/02/18 1208 02/02/18 1630  BP: 134/81  100/60 113/67  Pulse: (!) 59  62 60  Resp: 18     Temp: 98.1 F (36.7 C)  (!) 97.5 F (36.4 C) 97.7 F (36.5 C)  TempSrc: Oral  Oral Oral  SpO2: 97% 98% 96% 100%  Weight: 58.5  kg     Height:       Filed Weights   02/01/18 2222 02/02/18 0423  Weight: 58.5 kg 58.5 kg   Body mass index is 22.13 kg/m.   General appearance :Awake, alert, not in any distress.  Eyes:Pink conjunctiva HEENT: Atraumatic and Normocephalic Neck: supple, no JVD. No cervical lymphadenopathy.  Resp:Good air entry bilaterally, no added sounds  CVS: S1 S2 regular, no murmurs.  GI: Bowel sounds present, Non tender and not distended with no gaurding, rigidity or rebound.No organomegaly Extremities: B/L  Lower Ext shows no edema, both legs are warm to touch Neurology:  speech clear,Non focal, sensation is grossly intact. Musculoskeletal:No digital cyanosis Skin:No Rash, warm and dry Wounds:N/A  I have personally reviewed following labs and imaging studies  LABORATORY DATA: CBC: Recent Labs  Lab 02/01/18 1206  WBC 4.8  HGB 14.1  HCT 42.7  MCV 94.5  PLT 187    Basic Metabolic Panel: Recent Labs  Lab 02/01/18 1206  NA 140  K 4.0  CL 106  CO2 22  GLUCOSE 98  BUN 15  CREATININE 0.91  CALCIUM 9.1    GFR: Estimated Creatinine Clearance: 54.6 mL/min (by C-G formula based on SCr of 0.91 mg/dL).  Liver Function Tests: No results for input(s): AST, ALT, ALKPHOS, BILITOT, PROT, ALBUMIN in the last 168 hours. Recent Labs  Lab 02/01/18 1206  LIPASE 29   No results for input(s): AMMONIA in the last 168 hours.  Coagulation Profile: No results for input(s): INR, PROTIME in the last 168 hours.  Cardiac Enzymes: Recent Labs  Lab 02/01/18 2020 02/02/18 0137 02/02/18 0650  TROPONINI <0.03 <0.03 <0.03    BNP (last 3 results) No results for input(s): PROBNP in the last 8760 hours.  HbA1C: No results for input(s): HGBA1C in the last 72 hours.  CBG: No results for input(s): GLUCAP in the last 168 hours.  Lipid Profile: No results for input(s): CHOL, HDL, LDLCALC, TRIG, CHOLHDL, LDLDIRECT in the last 72 hours.  Thyroid Function Tests: No results for input(s): TSH, T4TOTAL, FREET4, T3FREE, THYROIDAB in the last 72 hours.  Anemia Panel: No results for input(s): VITAMINB12, FOLATE, FERRITIN, TIBC, IRON, RETICCTPCT in the last 72 hours.  Urine analysis:    Component Value Date/Time   COLORURINE YELLOW 02/01/2018 1612   APPEARANCEUR CLEAR 02/01/2018 1612   LABSPEC 1.014 02/01/2018 1612   PHURINE 6.0 02/01/2018 1612   GLUCOSEU NEGATIVE 02/01/2018 1612   HGBUR NEGATIVE 02/01/2018 1612   BILIRUBINUR NEGATIVE 02/01/2018 1612   KETONESUR NEGATIVE 02/01/2018 1612    PROTEINUR NEGATIVE 02/01/2018 1612   UROBILINOGEN 1.0 12/16/2014 1325   NITRITE NEGATIVE 02/01/2018 1612   LEUKOCYTESUR NEGATIVE 02/01/2018 1612    Sepsis Labs: Lactic Acid, Venous No results found for: LATICACIDVEN  MICROBIOLOGY: Recent Results (from the past 240 hour(s))  Wet prep, genital     Status: Abnormal   Collection Time: 02/01/18  5:28 PM  Result Value Ref Range Status   Yeast Wet Prep HPF POC NONE SEEN NONE SEEN Final   Trich, Wet Prep NONE SEEN NONE SEEN Final   Clue Cells Wet Prep HPF POC PRESENT (A) NONE SEEN Final   WBC, Wet Prep HPF POC MANY (A) NONE SEEN Final   Sperm NONE SEEN  Final    Comment: Performed at [facility], [address], [place], [place] [address]    RADIOLOGY STUDIES/RESULTS: Dg Chest 2 View  Result Date: 02/01/2018 CLINICAL DATA:  Chest pain EXAM: CHEST - 2 VIEW  COMPARISON:  CT chest 08/30/2017 FINDINGS: The heart size and mediastinal contours are within normal limits. Both lungs are clear. The visualized skeletal structures are unremarkable. IMPRESSION: No active cardiopulmonary disease. Electronically Signed   By: Elige Ko   On: 02/01/2018 12:37   Ct Head Wo Contrast  Result Date: 02/01/2018 CLINICAL DATA:  Recurrent syncopal episodes. EXAM: CT HEAD WITHOUT CONTRAST TECHNIQUE: Contiguous axial images were obtained from the base of the skull through the vertex without intravenous contrast. COMPARISON:  Head CT scan 01/01/2017. FINDINGS: Brain: No evidence of acute infarction, hemorrhage, hydrocephalus, extra-axial collection or mass lesion/mass effect. Vascular: Atherosclerosis noted. Skull: Intact.  No focal lesion. Sinuses/Orbits: Negative. Other: None. IMPRESSION: No acute abnormality. Atherosclerosis. Electronically Signed   By: Drusilla Kanner M.D.   On: 02/01/2018 17:20     LOS: 0 days   Jeoffrey Massed, MD  [facility]  If 7PM-7AM, please contact night-coverage  Please page via [email or web address]-click on  MD name and type text message  02/02/2018, 5:09 PM

## 2018-02-02 NOTE — Progress Notes (Signed)
Patient resting comfortably during shift report. Denies complaints.  

## 2018-02-02 NOTE — Progress Notes (Signed)
EEG complete - results pending 

## 2018-02-02 NOTE — Procedures (Signed)
Date of recording 02/02/2018  Referring physician Ghimire  Reason for the study syncope  Technical Digital EEG recording using 10-20 international electrode system  Description of the recording When awake posterior dominant rhythm is 8-9Hz  symmetrical reactive Mild slowing during drowsiness Non REM stage II sleep was not seen Epileptiform features were not seen during this recording  Impression The EEG isbnormal in awake and drowsy states

## 2018-02-03 ENCOUNTER — Observation Stay (HOSPITAL_COMMUNITY): Payer: Medicare Other

## 2018-02-03 DIAGNOSIS — N760 Acute vaginitis: Secondary | ICD-10-CM | POA: Diagnosis not present

## 2018-02-03 DIAGNOSIS — F1410 Cocaine abuse, uncomplicated: Secondary | ICD-10-CM | POA: Diagnosis not present

## 2018-02-03 DIAGNOSIS — R918 Other nonspecific abnormal finding of lung field: Secondary | ICD-10-CM | POA: Diagnosis not present

## 2018-02-03 DIAGNOSIS — R55 Syncope and collapse: Secondary | ICD-10-CM | POA: Diagnosis not present

## 2018-02-03 LAB — ECHOCARDIOGRAM COMPLETE
Height: 64 in
WEIGHTICAEL: 2129.6 [oz_av]

## 2018-02-03 MED ORDER — METRONIDAZOLE 500 MG PO TABS: 500.0000 mg | ORAL_TABLET | Freq: Two times a day (BID) | ORAL | 0 refills | Status: DC

## 2018-02-03 NOTE — Discharge Summary (Signed)
PATIENT DETAILS Name: Terry Potter Age: 62 y.o. Sex: female Date of Birth: Jun 07, 1956 MRN: [id number]. Admitting Physician: Hillary Bow, DO [id number], Duke Salvia, MD  Admit Date: 02/01/2018 Discharge date: 02/03/2018  Recommendations for Outpatient Follow-up:  1. Follow up with PCP in 1-2 weeks 2. Please obtain BMP/CBC in one week 3. Encourage abstinence from cocaine use 4. CT chest for nodule follow-up pending-please follow.   Admitted From:  Home   Disposition: Home   Home Health: No  Equipment/Devices: None  Discharge Condition: Stable  CODE STATUS: FULL CODE  Diet recommendation:  Regular   Brief Summary: See H&P, Labs, Consult and Test reports for all details in brief, Patient is a 62 y.o. female with history of bipolar disorder-who presented to the hospital for evaluation of 2 spots where she claims she lost consciousness (?  Syncope).  Patient acknowledges using cocaine in excessive alcohol last week-she was celebrating her birthday.  Subsequently admitted for further evaluation and treatment.    Brief Hospital Course: ?Syncope: Unsure whether these are actually syncopal episodes-patient describes these episodes where she is sitting and then wakes up 15 minutes later-she does not have recollection of these events but is not sure whether she actually just went to sleep and dozed of-she did not loose posture and still sitting when she came through.  Denies urinary incontinence or tongue bite.  Denies any confusion after these events.  She claims that she and her friend had a similar episode last week.  She has no prior history of syncope, she has no prior cardiac history.  These so-called syncopal episodes were in a setting of excessive cocaine and alcohol use last week.  Telemetry without any arrhythmias-EEG negative for seizures, CT head without any acute abnormalities.    Echocardiogram without any major abnormalities.  If these episodes recur-may need  outpatient Holter/event monitor.  Patient clearly instructed by this MD not to drive (see instructions below) until she is cleared by her primary care practitioner.  Cocaine use: Counseled-not sure if she has any intention to quit at this time.  Bipolar disorder: Appears to be stable and at baseline-continue Risperdal, Trileptal and Klonopin.  Bacterial vaginosis: Continue metronidazole for 5 more days to complete a 7-day course.  History of pulmonary nodules:Repeat CT chest showed report pending-patient aware that she needs to follow-up these results with her PCP.  She is aware of the risk of malignancy.  Procedures/Studies: None  Discharge Diagnoses:  Principal Problem:   Syncope Active Problems:   Bacterial vaginosis   Cocaine abuse (HCC)   Pulmonary nodules   Discharge Instructions:  Activity:  As tolerated  Discharge Instructions    Diet general   Complete by:  As directed    Discharge instructions   Complete by:  As directed    Follow with Primary MD  Pavelock, Duke Salvia, MD in 1 week  Please get a complete blood count and chemistry panel checked by your Primary MD at your next visit, and again as instructed by your Primary MD.  Get Medicines reviewed and adjusted: Please take all your medications with you for your next visit with your Primary MD  Laboratory/radiological data: Please request your Primary MD to go over all hospital tests and procedure/radiological results at the follow up, please ask your Primary MD to get all Hospital records sent to his/her office.  In some cases, they will be blood work, cultures and biopsy results pending at the time of your discharge. Please request that your  primary care M.D. follows up on these results.  Also Note the following: If you experience worsening of your admission symptoms, develop shortness of breath, life threatening emergency, suicidal or homicidal thoughts you must seek medical attention immediately by  calling 911 or calling your MD immediately  if symptoms less severe.  You must read complete instructions/literature along with all the possible adverse reactions/side effects for all the Medicines you take and that have been prescribed to you. Take any new Medicines after you have completely understood and accpet all the possible adverse reactions/side effects.   Do not drive when taking Pain medications or sleeping medications (Benzodaizepines)  Do not take more than prescribed Pain, Sleep and Anxiety Medications. It is not advisable to combine anxiety,sleep and pain medications without talking with your primary care practitioner  Special Instructions: If you have smoked or chewed Tobacco  in the last 2 yrs please stop smoking, stop any regular Alcohol  and or any Recreational drug use.  Wear Seat belts while driving.  Please note: You were cared for by a hospitalist during your hospital stay. Once you are discharged, your primary care physician will handle any further medical issues. Please note that NO REFILLS for any discharge medications will be authorized once you are discharged, as it is imperative that you return to your primary care physician (or establish a relationship with a primary care physician if you do not have one) for your post hospital discharge needs so that they can reassess your need for medications and monitor your lab values.   Driving Restrictions   Complete by:  As directed    Please do not driveuntil you have been cleared by your primary care practitioner.. Use caution when using heavy equipment or power tools. Avoid working on ladders or at heights. Take showers instead of baths. Ensure the water temperature is not too high on the home water heater. Do not go swimming alone. When caring for infants or small children, sit down when holding, feeding, or changing them to minimize risk of injury to the child in the event you have a seizure.   Increase activity slowly    Complete by:  As directed      Allergies as of 02/03/2018      Reactions   Tetracyclines & Related Hives      Medication List    TAKE these medications   aspirin 325 MG EC tablet Take 1 tablet (325 mg total) by mouth 2 (two) times daily with a meal.   budesonide-formoterol 160-4.5 MCG/ACT inhaler Commonly known as:  SYMBICORT Inhale 2 puffs into the lungs 2 (two) times daily.   clonazePAM 0.5 MG tablet Commonly known as:  KLONOPIN Take 0.5 mg by mouth 2 (two) times daily as needed for anxiety.   fluticasone 50 MCG/ACT nasal spray Commonly known as:  FLONASE Place 1 spray into both nostrils daily as needed for rhinitis.   gabapentin 300 MG capsule Commonly known as:  NEURONTIN Take 300 mg by mouth 3 (three) times daily.   metroNIDAZOLE 500 MG tablet Commonly known as:  FLAGYL Take 1 tablet (500 mg total) by mouth 2 (two) times daily.   multivitamin with minerals Tabs tablet Take 1 tablet by mouth daily.   naproxen 500 MG tablet Commonly known as:  NAPROSYN Take 500 mg by mouth 3 (three) times daily as needed (pain).   nicotine 21 mg/24hr patch Commonly known as:  NICODERM CQ - dosed in mg/24 hours Place 21 mg onto the skin  daily.   Oxcarbazepine 300 MG tablet Commonly known as:  TRILEPTAL Take 300 mg by mouth 3 (three) times daily.   PROAIR HFA 108 (90 Base) MCG/ACT inhaler Generic drug:  albuterol Inhale 2 puffs into the lungs every 6 (six) hours as needed for wheezing or shortness of breath.   risperiDONE 1 MG tablet Commonly known as:  RISPERDAL Take 1 mg by mouth 2 (two) times daily.      Follow-up Information    Pavelock, Duke Salvia, MD. Go on 02/04/2018.   Specialty:  Internal Medicine Why:  @11 :30am Contact information: [address] [place] [address] [phone]          Allergies  Allergen Reactions  . Tetracyclines & Related Hives    Consultations:   None  Other Procedures/Studies: Dg Chest 2 View  Result  Date: 02/01/2018 CLINICAL DATA:  Chest pain EXAM: CHEST - 2 VIEW COMPARISON:  CT chest 08/30/2017 FINDINGS: The heart size and mediastinal contours are within normal limits. Both lungs are clear. The visualized skeletal structures are unremarkable. IMPRESSION: No active cardiopulmonary disease. Electronically Signed   By: Elige Ko   On: 02/01/2018 12:37   Ct Head Wo Contrast  Result Date: 02/01/2018 CLINICAL DATA:  Recurrent syncopal episodes. EXAM: CT HEAD WITHOUT CONTRAST TECHNIQUE: Contiguous axial images were obtained from the base of the skull through the vertex without intravenous contrast. COMPARISON:  Head CT scan 01/01/2017. FINDINGS: Brain: No evidence of acute infarction, hemorrhage, hydrocephalus, extra-axial collection or mass lesion/mass effect. Vascular: Atherosclerosis noted. Skull: Intact.  No focal lesion. Sinuses/Orbits: Negative. Other: None. IMPRESSION: No acute abnormality. Atherosclerosis. Electronically Signed   By: Drusilla Kanner M.D.   On: 02/01/2018 17:20     TODAY-DAY OF DISCHARGE:  Subjective:   Terry Potter today has no headache,no chest abdominal pain,no new weakness tingling or numbness, feels much better wants to go home today.   Objective:   Blood pressure 93/60, pulse 82, temperature 98.1 F (36.7 C), temperature source Oral, resp. rate 18, height 5\' 4"  (1.626 m), weight 60.4 kg, SpO2 99 %.  Intake/Output Summary (Last 24 hours) at 02/03/2018 1329 Last data filed at 02/03/2018 0913 Gross per 24 hour  Intake 702 ml  Output 1900 ml  Net -1198 ml   Filed Weights   02/01/18 2222 02/02/18 0423 02/03/18 0403  Weight: 58.5 kg 58.5 kg 60.4 kg    Exam: Awake Alert, Oriented *3, No new F.N deficits, Normal affect [place].AT,PERRAL Supple Neck,No JVD, No cervical lymphadenopathy appriciated.  Symmetrical Chest wall movement, Good air movement bilaterally, CTAB RRR,No Gallops,Rubs or new Murmurs, No Parasternal Heave +ve B.Sounds, Abd Soft, Non tender,  No organomegaly appriciated, No rebound -guarding or rigidity. No Cyanosis, Clubbing or edema, No new Rash or bruise   PERTINENT RADIOLOGIC STUDIES: Dg Chest 2 View  Result Date: 02/01/2018 CLINICAL DATA:  Chest pain EXAM: CHEST - 2 VIEW COMPARISON:  CT chest 08/30/2017 FINDINGS: The heart size and mediastinal contours are within normal limits. Both lungs are clear. The visualized skeletal structures are unremarkable. IMPRESSION: No active cardiopulmonary disease. Electronically Signed   By: Elige Ko   On: 02/01/2018 12:37   Ct Head Wo Contrast  Result Date: 02/01/2018 CLINICAL DATA:  Recurrent syncopal episodes. EXAM: CT HEAD WITHOUT CONTRAST TECHNIQUE: Contiguous axial images were obtained from the base of the skull through the vertex without intravenous contrast. COMPARISON:  Head CT scan 01/01/2017. FINDINGS: Brain: No evidence of acute infarction, hemorrhage, hydrocephalus, extra-axial collection or mass  lesion/mass effect. Vascular: Atherosclerosis noted. Skull: Intact.  No focal lesion. Sinuses/Orbits: Negative. Other: None. IMPRESSION: No acute abnormality. Atherosclerosis. Electronically Signed   By: Drusilla Kanner M.D.   On: 02/01/2018 17:20     PERTINENT LAB RESULTS: CBC: Recent Labs    02/01/18 1206  WBC 4.8  HGB 14.1  HCT 42.7  PLT 187   CMET CMP     Component Value Date/Time   NA 140 02/01/2018 1206   K 4.0 02/01/2018 1206   CL 106 02/01/2018 1206   CO2 22 02/01/2018 1206   GLUCOSE 98 02/01/2018 1206   BUN 15 02/01/2018 1206   CREATININE 0.91 02/01/2018 1206   CALCIUM 9.1 02/01/2018 1206   PROT 5.0 (L) 01/02/2017 0423   ALBUMIN 2.9 (L) 01/02/2017 0423   AST 31 01/02/2017 0423   ALT 14 01/02/2017 0423   ALKPHOS 49 01/02/2017 0423   BILITOT 0.5 01/02/2017 0423   GFRNONAA >60 02/01/2018 1206   GFRAA >60 02/01/2018 1206    GFR Estimated Creatinine Clearance: 54.6 mL/min (by C-G formula based on SCr of 0.91 mg/dL). Recent Labs    02/01/18 1206    LIPASE 29   Recent Labs    02/01/18 2020 02/02/18 0137 02/02/18 0650  TROPONINI <0.03 <0.03 <0.03   Invalid input(s): POCBNP No results for input(s): DDIMER in the last 72 hours. No results for input(s): HGBA1C in the last 72 hours. No results for input(s): CHOL, HDL, LDLCALC, TRIG, CHOLHDL, LDLDIRECT in the last 72 hours. No results for input(s): TSH, T4TOTAL, T3FREE, THYROIDAB in the last 72 hours.  Invalid input(s): FREET3 No results for input(s): VITAMINB12, FOLATE, FERRITIN, TIBC, IRON, RETICCTPCT in the last 72 hours. Coags: No results for input(s): INR in the last 72 hours.  Invalid input(s): PT Microbiology: Recent Results (from the past 240 hour(s))  Wet prep, genital     Status: Abnormal   Collection Time: 02/01/18  5:28 PM  Result Value Ref Range Status   Yeast Wet Prep HPF POC NONE SEEN NONE SEEN Final   Trich, Wet Prep NONE SEEN NONE SEEN Final   Clue Cells Wet Prep HPF POC PRESENT (A) NONE SEEN Final   WBC, Wet Prep HPF POC MANY (A) NONE SEEN Final   Sperm NONE SEEN  Final    Comment: Performed at [facility], [address], [place], [place] [address]    FURTHER DISCHARGE INSTRUCTIONS:  Get Medicines reviewed and adjusted: Please take all your medications with you for your next visit with your Primary MD  Laboratory/radiological data: Please request your Primary MD to go over all hospital tests and procedure/radiological results at the follow up, please ask your Primary MD to get all Hospital records sent to his/her office.  In some cases, they will be blood work, cultures and biopsy results pending at the time of your discharge. Please request that your primary care M.D. goes through all the records of your hospital data and follows up on these results.  Also Note the following: If you experience worsening of your admission symptoms, develop shortness of breath, life threatening emergency, suicidal or homicidal thoughts you must seek medical  attention immediately by calling 911 or calling your MD immediately  if symptoms less severe.  You must read complete instructions/literature along with all the possible adverse reactions/side effects for all the Medicines you take and that have been prescribed to you. Take any new Medicines after you have completely understood and accpet all the possible adverse reactions/side effects.  Do not drive when taking Pain medications or sleeping medications (Benzodaizepines)  Do not take more than prescribed Pain, Sleep and Anxiety Medications. It is not advisable to combine anxiety,sleep and pain medications without talking with your primary care practitioner  Special Instructions: If you have smoked or chewed Tobacco  in the last 2 yrs please stop smoking, stop any regular Alcohol  and or any Recreational drug use.  Wear Seat belts while driving.  Please note: You were cared for by a hospitalist during your hospital stay. Once you are discharged, your primary care physician will handle any further medical issues. Please note that NO REFILLS for any discharge medications will be authorized once you are discharged, as it is imperative that you return to your primary care physician (or establish a relationship with a primary care physician if you do not have one) for your post hospital discharge needs so that they can reassess your need for medications and monitor your lab values.  Total Time spent coordinating discharge including counseling, education and face to face time equals 30 minutes.  SignedJeoffrey Massed 02/03/2018 1:29 PM

## 2018-02-03 NOTE — Plan of Care (Signed)
  Problem: Activity: Goal: Risk for activity intolerance will decrease Outcome: Completed/Met   Problem: Nutrition: Goal: Adequate nutrition will be maintained Outcome: Completed/Met   Problem: Coping: Goal: Level of anxiety will decrease Outcome: Completed/Met   Problem: Elimination: Goal: Will not experience complications related to bowel motility Outcome: Completed/Met Goal: Will not experience complications related to urinary retention Outcome: Completed/Met   Problem: Pain Managment: Goal: General experience of comfort will improve Outcome: Completed/Met   Problem: Safety: Goal: Ability to remain free from injury will improve Outcome: Completed/Met   Problem: Skin Integrity: Goal: Risk for impaired skin integrity will decrease Outcome: Completed/Met

## 2018-02-03 NOTE — Progress Notes (Signed)
  Echocardiogram 2D Echocardiogram has been performed.  Terry Potter 02/03/2018, 9:43 AM

## 2018-05-29 ENCOUNTER — Encounter (HOSPITAL_COMMUNITY): Payer: Self-pay | Admitting: Emergency Medicine

## 2018-05-29 ENCOUNTER — Emergency Department (HOSPITAL_COMMUNITY): Payer: Medicare Other

## 2018-05-29 ENCOUNTER — Emergency Department (HOSPITAL_COMMUNITY)
Admission: EM | Admit: 2018-05-29 | Discharge: 2018-05-29 | Disposition: A | Discharge status: Home / Self Care | Payer: Medicare Other | Attending: Emergency Medicine | Admitting: Emergency Medicine

## 2018-05-29 ENCOUNTER — Other Ambulatory Visit: Payer: Self-pay

## 2018-05-29 DIAGNOSIS — R0789 Other chest pain: Secondary | ICD-10-CM | POA: Diagnosis not present

## 2018-05-29 DIAGNOSIS — R197 Diarrhea, unspecified: Secondary | ICD-10-CM | POA: Diagnosis not present

## 2018-05-29 DIAGNOSIS — Z79899 Other long term (current) drug therapy: Secondary | ICD-10-CM | POA: Diagnosis not present

## 2018-05-29 DIAGNOSIS — Z96641 Presence of right artificial hip joint: Secondary | ICD-10-CM | POA: Insufficient documentation

## 2018-05-29 DIAGNOSIS — F17210 Nicotine dependence, cigarettes, uncomplicated: Secondary | ICD-10-CM | POA: Diagnosis not present

## 2018-05-29 DIAGNOSIS — Z7982 Long term (current) use of aspirin: Secondary | ICD-10-CM | POA: Diagnosis not present

## 2018-05-29 DIAGNOSIS — R112 Nausea with vomiting, unspecified: Secondary | ICD-10-CM | POA: Diagnosis present

## 2018-05-29 LAB — COMPREHENSIVE METABOLIC PANEL
ALBUMIN: 3.3 g/dL — AB (ref 3.5–5.0)
ALT: 17 U/L (ref 0–44)
AST: 38 U/L (ref 15–41)
Alkaline Phosphatase: 59 U/L (ref 38–126)
Anion gap: 11 (ref 5–15)
BILIRUBIN TOTAL: 1.3 mg/dL — AB (ref 0.3–1.2)
BUN: 14 mg/dL (ref 8–23)
CHLORIDE: 106 mmol/L (ref 98–111)
CO2: 21 mmol/L — ABNORMAL LOW (ref 22–32)
Calcium: 8.5 mg/dL — ABNORMAL LOW (ref 8.9–10.3)
Creatinine, Ser: 0.77 mg/dL (ref 0.44–1.00)
GFR calc Af Amer: >60 mL/min (ref >60)
GFR calc non Af Amer: >60 mL/min (ref >60)
GLUCOSE: 105 mg/dL — AB (ref 70–99)
POTASSIUM: 4.4 mmol/L (ref 3.5–5.1)
Sodium: 138 mmol/L (ref 135–145)
Total Protein: 6.3 g/dL — ABNORMAL LOW (ref 6.5–8.1)

## 2018-05-29 LAB — I-STAT TROPONIN, ED: TROPONIN I, POC: 0.01 ng/mL (ref 0.00–0.08)

## 2018-05-29 LAB — CBC WITH DIFFERENTIAL/PLATELET
ABS IMMATURE GRANULOCYTES: 0.01 10*3/uL (ref 0.00–0.07)
Basophils Absolute: 0 10*3/uL (ref 0.0–0.1)
Basophils Relative: 0 %
Eosinophils Absolute: 0 10*3/uL (ref 0.0–0.5)
Eosinophils Relative: 1 %
HCT: 41.5 % (ref 36.0–46.0)
HEMOGLOBIN: 13.3 g/dL (ref 12.0–15.0)
Immature Granulocytes: 0 %
Lymphocytes Relative: 27 %
Lymphs Abs: 1 10*3/uL (ref 0.7–4.0)
MCH: 31.3 pg (ref 26.0–34.0)
MCHC: 32 g/dL (ref 30.0–36.0)
MCV: 97.6 fL (ref 80.0–100.0)
MONO ABS: 0.2 10*3/uL (ref 0.1–1.0)
MONOS PCT: 5 %
NEUTROS ABS: 2.5 10*3/uL (ref 1.7–7.7)
Neutrophils Relative %: 67 %
Platelets: 194 10*3/uL (ref 150–400)
RBC: 4.25 MIL/uL (ref 3.87–5.11)
RDW: 15 % (ref 11.5–15.5)
WBC: 3.7 10*3/uL — ABNORMAL LOW (ref 4.0–10.5)
nRBC: 0 % (ref 0.0–0.2)

## 2018-05-29 LAB — LIPASE, BLOOD: Lipase: 33 U/L (ref 11–51)

## 2018-05-29 MED ORDER — ONDANSETRON HCL 4 MG/2ML IJ SOLN
4.0000 mg | Freq: Once | INTRAMUSCULAR | Status: AC
  Administered 2018-05-29: 4 mg via INTRAVENOUS
  Filled 2018-05-29: qty 2

## 2018-05-29 MED ORDER — SODIUM CHLORIDE 0.9 % IV BOLUS
500.0000 mL | Freq: Once | INTRAVENOUS | Status: AC
  Administered 2018-05-29: 500 mL via INTRAVENOUS

## 2018-05-29 MED ORDER — KETOROLAC TROMETHAMINE 15 MG/ML IJ SOLN
15.0000 mg | Freq: Once | INTRAMUSCULAR | Status: AC
  Administered 2018-05-29: 15 mg via INTRAVENOUS
  Filled 2018-05-29: qty 1

## 2018-05-29 NOTE — ED Notes (Signed)
Pt verbalized understanding of discharge instructions and denies any further questions at this time.   

## 2018-05-29 NOTE — ED Notes (Signed)
ED Provider at bedside. 

## 2018-05-29 NOTE — ED Triage Notes (Signed)
Pt with multiple complaints. Reports most importantly she has been experiencing vomiting and diarrhea x2 days. Denies fever but reports chills at times. Reports chest pain from throwing up. Swarm in progress with EDP.

## 2018-05-29 NOTE — ED Provider Notes (Signed)
[facility] EMERGENCY DEPARTMENT Provider Note   CSN: [id number] Arrival date & time: 05/29/18  9892     History   Chief Complaint Chief Complaint  Patient presents with  . Emesis  . Diarrhea    HPI Terry Potter is a 62 y.o. female.  62 year old female with prior medical history as detailed below presents for evaluation of multiple complaints.  Her primary complaints appear to be nausea, vomiting, and diarrhea.  The symptoms started over the last 2 to 3 days.  She reports vague anterior chest discomfort when she does vomit.  She denies associated diaphoresis or shortness of breath.  She denies fever.  She does report some chills.  She denies associated abdominal pain.  She reports probable use of cocaine and marijuana last Thursday at a Christmas party.  She denies recent alcohol use.  The history is provided by the patient and medical records.  Emesis   This is a new problem. The current episode started more than 2 days ago. The problem occurs 2 to 4 times per day. The problem has not changed since onset.There has been no fever. Associated symptoms include chills and diarrhea. Pertinent negatives include no abdominal pain.    Past Medical History:  Diagnosis Date  . Arthritis   . Bipolar disorder (HCC)   . Ectopic pregnancy     Patient Active Problem List   Diagnosis Date Noted  . Bacterial vaginosis 02/01/2018  . Cocaine abuse (HCC) 02/01/2018  . Pulmonary nodules 02/01/2018  . Syncope 02/01/2018  . Hip fracture, unspecified laterality, closed, initial encounter (HCC) 01/01/2017  . Hip fracture (HCC) 01/01/2017  . Displaced fracture of left femoral neck (HCC) 01/01/2017  . Schizoaffective disorder, bipolar type (HCC) 03/19/2015    Class: Chronic    Past Surgical History:  Procedure Laterality Date  . ABDOMINAL SURGERY    . 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 TUMOR REMOVAL     Uterine tumor removed     OB History   No obstetric history on file.      Home Medications    Prior to Admission medications   Medication Sig Start Date End Date Taking? Authorizing Provider  albuterol (PROAIR HFA) 108 (90 Base) MCG/ACT inhaler Inhale 2 puffs into the lungs every 6 (six) hours as needed for wheezing or shortness of breath.    [provider]  aspirin EC 325 MG EC tablet Take 1 tablet (325 mg total) by mouth 2 (two) times daily with a meal. 01/03/17   Constable, Amber, PA-C  budesonide-formoterol (SYMBICORT) 160-4.5 MCG/ACT inhaler Inhale 2 puffs into the lungs 2 (two) times daily.    [provider]  clonazePAM (KLONOPIN) 0.5 MG tablet Take 0.5 mg by mouth 2 (two) times daily as needed for anxiety.  08/03/17   [provider]  fluticasone (FLONASE) 50 MCG/ACT nasal spray Place 1 spray into both nostrils daily as needed for rhinitis.    [provider]  gabapentin (NEURONTIN) 300 MG capsule Take 300 mg by mouth 3 (three) times daily.    [provider]  metroNIDAZOLE (FLAGYL) 500 MG tablet Take 1 tablet (500 mg total) by mouth 2 (two) times daily. 02/03/18   Ghimire, Werner Lean, MD  Multiple Vitamin (MULTIVITAMIN WITH MINERALS) TABS tablet Take 1 tablet by mouth daily.    [provider]  naproxen (NAPROSYN) 500 MG tablet Take 500 mg by mouth 3 (three) times daily as needed (pain).  08/19/17   [provider]  nicotine (NICODERM CQ - DOSED IN MG/24 HOURS) 21 mg/24hr patch Place 21 mg onto the skin daily.    [provider]  Oxcarbazepine (TRILEPTAL) 300 MG tablet Take 300 mg by mouth 3 (three) times daily.    [provider]  risperiDONE (RISPERDAL) 1 MG tablet Take 1 mg by mouth 2 (two) times daily.    [provider]    Family History Family History  Problem Relation Age of Onset  . Cancer  Mother     Social History Social History   Tobacco Use  . Smoking status: Current Every Day Smoker    Packs/day: 0.25    Types: Cigarettes, Cigars  . Smokeless tobacco: Never Used  Substance Use Topics  . Alcohol use: Yes    Comment: occ  . Drug use: Yes    Types: Cocaine     Allergies   Tetracyclines & related   Review of Systems Review of Systems  Constitutional: Positive for chills.  Gastrointestinal: Positive for diarrhea and vomiting. Negative for abdominal pain.  All other systems reviewed and are negative.    Physical Exam Updated Vital Signs BP 104/76 (BP Location: Right Arm)   Pulse 65   Temp 97.7 F (36.5 C) (Oral)   Resp 20   Ht 5\' 4"  (1.626 m)   SpO2 96%   BMI 22.85 kg/m   Physical Exam Vitals signs and nursing note reviewed.  Constitutional:      General: She is not in acute distress.    Appearance: She is well-developed.  HENT:     Head: Normocephalic and atraumatic.  Eyes:     Conjunctiva/sclera: Conjunctivae normal.     Pupils: Pupils are equal, round, and reactive to light.  Neck:     Musculoskeletal: Normal range of motion and neck supple.  Cardiovascular:     Rate and Rhythm: Normal rate and regular rhythm.     Heart sounds: Normal heart sounds.  Pulmonary:     Effort: Pulmonary effort is normal. No respiratory distress.     Breath sounds: Normal breath sounds.  Abdominal:     General: There is no distension.     Palpations: Abdomen is soft.     Tenderness: There is no abdominal tenderness.  Musculoskeletal: Normal range of motion.        General: No deformity.  Skin:    General: Skin is warm and dry.  Neurological:     Mental Status: She is alert and oriented to person, place, and time.      ED Treatments / Results  Labs (all labs ordered are listed, but only abnormal results are displayed) Labs Reviewed  COMPREHENSIVE METABOLIC PANEL - Abnormal; Notable for the following components:      Result Value   CO2 21 (*)      Glucose, Bld 105 (*)    Calcium 8.5 (*)    Total Protein 6.3 (*)    Albumin 3.3 (*)    Total Bilirubin 1.3 (*)    All other components within normal limits  CBC WITH DIFFERENTIAL/PLATELET - Abnormal; Notable for the following components:   WBC 3.7 (*)    All other components within normal limits  LIPASE, BLOOD  I-STAT TROPONIN, ED    EKG EKG Interpretation  Date/Time:  Sunday May 29 2018 09:52:38 EST Ventricular Rate:  76 PR Interval:  QRS Duration: 79 QT Interval:  376 QTC Calculation: 423 R Axis:   28 Text Interpretation:  Sinus rhythm Probable left atrial enlargement Probable left ventricular hypertrophy Confirmed by Kristine Royal [phone]) on 05/29/2018 9:59:25 AM   Radiology Dg Chest Port 1 View  Result Date: 05/29/2018 CLINICAL DATA:  Vomiting and diarrhea. Chills. No fever. Chest pain. EXAM: PORTABLE CHEST 1 VIEW COMPARISON:  February 01, 2018 FINDINGS: The heart size and mediastinal contours are within normal limits. Both lungs are clear. The visualized skeletal structures are unremarkable. IMPRESSION: No active disease. Electronically Signed   By: Gerome Sam III M.D   On: 05/29/2018 10:29    Procedures Procedures (including critical care time)  Medications Ordered in ED Medications  ondansetron (ZOFRAN) injection 4 mg (has no administration in time range)  sodium chloride 0.9 % bolus 500 mL (has no administration in time range)  ketorolac (TORADOL) 15 MG/ML injection 15 mg (has no administration in time range)     Initial Impression / Assessment and Plan / ED Course  I have reviewed the triage vital signs and the nursing notes.  Pertinent labs & imaging results that were available during my care of the patient were reviewed by me and considered in my medical decision making (see chart for details).    MDM  Screen complete  Patient is presenting for evaluation of nausea, vomiting, and diarrhea.  Symptoms are most likely secondary to likely  viral syndrome.  Patient's screening labs in the ED are without significant abnormality.  She does feel improved following IV fluids and treatment in the ED.  Of note, the patient's reported chest discomfort is highly atypical.  EKG is without significant abnormality.  Screening troponin is negative.  Her discomfort is been present for the last 2 to 3 days constantly.  It is worse with vomiting.  I suspect that some degree of her chest discomfort is musculoskeletal related to her episodes of vomiting.  Importance of close follow-up was stressed.  Strict return precautions given and understood.   Final Clinical Impressions(s) / ED Diagnoses   Final diagnoses:  Diarrhea, unspecified type    ED Discharge Orders    None       Wynetta Fines, MD 05/29/18 1249

## 2018-05-29 NOTE — Discharge Instructions (Addendum)
Please return for any problem.  Follow-up with your regular care provider as instructed.  You may apply Desitin cream to the area around your rectum to relieve irritation secondary to your diarrhea.  Please wash herself thoroughly after every round of diarrhea.  Also, please refrain from using cocaine in the future.

## 2019-03-27 ENCOUNTER — Encounter (HOSPITAL_COMMUNITY): Payer: Self-pay | Admitting: Emergency Medicine

## 2019-03-27 ENCOUNTER — Emergency Department (HOSPITAL_COMMUNITY)
Admission: EM | Admit: 2019-03-27 | Discharge: 2019-03-27 | Disposition: A | Discharge status: Home / Self Care | Payer: Medicare Other | Attending: Emergency Medicine | Admitting: Emergency Medicine

## 2019-03-27 ENCOUNTER — Other Ambulatory Visit: Payer: Self-pay

## 2019-03-27 DIAGNOSIS — N390 Urinary tract infection, site not specified: Secondary | ICD-10-CM | POA: Insufficient documentation

## 2019-03-27 DIAGNOSIS — F1410 Cocaine abuse, uncomplicated: Secondary | ICD-10-CM | POA: Insufficient documentation

## 2019-03-27 DIAGNOSIS — Z7982 Long term (current) use of aspirin: Secondary | ICD-10-CM | POA: Insufficient documentation

## 2019-03-27 DIAGNOSIS — Z79899 Other long term (current) drug therapy: Secondary | ICD-10-CM | POA: Insufficient documentation

## 2019-03-27 DIAGNOSIS — R319 Hematuria, unspecified: Secondary | ICD-10-CM | POA: Diagnosis present

## 2019-03-27 DIAGNOSIS — F17210 Nicotine dependence, cigarettes, uncomplicated: Secondary | ICD-10-CM | POA: Insufficient documentation

## 2019-03-27 LAB — URINALYSIS, ROUTINE W REFLEX MICROSCOPIC
Bilirubin Urine: NEGATIVE
Glucose, UA: NEGATIVE mg/dL
Ketones, ur: NEGATIVE mg/dL
Nitrite: NEGATIVE
Protein, ur: 100 mg/dL — AB
Specific Gravity, Urine: 1.017 (ref 1.005–1.030)
WBC, UA: >50 WBC/hpf — ABNORMAL HIGH (ref 0–5)
pH: 5 (ref 5.0–8.0)

## 2019-03-27 MED ORDER — CEPHALEXIN 500 MG PO CAPS: 500.0000 mg | ORAL_CAPSULE | Freq: Two times a day (BID) | ORAL | 0 refills | Status: DC

## 2019-03-27 NOTE — ED Notes (Signed)
Patient verbalizes understanding of discharge instructions. Opportunity for questioning and answers were provided. Armband removed by staff, pt discharged from ED ambulatory to home.  

## 2019-03-27 NOTE — ED Triage Notes (Signed)
Pt reports 3-4 days of hematuria, dysuria. Denies recent fevers.

## 2019-03-27 NOTE — ED Provider Notes (Signed)
[facility] EMERGENCY DEPARTMENT Provider Note   CSN: [id number] Arrival date & time: 03/27/19  0932     History   Chief Complaint Chief Complaint  Patient presents with  . Hematuria    HPI Terry Potter is a 63 y.o. female.     HPI   63 year old female presents today with complaints of dysuria and hematuria.  Patient notes symptoms started 3 days ago with burning with urination.  She denies any fever back pain abdominal pain nausea or vomiting.  She notes this feels similar to previous urinary tract infection that she has had several years ago no recent infections.  She denies any vaginal discharge or bleeding.    Past Medical History:  Diagnosis Date  . Arthritis   . Bipolar disorder ([place])   . Ectopic pregnancy     Patient Active Problem List   Diagnosis Date Noted  . Bacterial vaginosis 02/01/2018  . Cocaine abuse ([place]) 02/01/2018  . Pulmonary nodules 02/01/2018  . Syncope 02/01/2018  . Hip fracture, unspecified laterality, closed, initial encounter ([place]) 01/01/2017  . Hip fracture ([place]) 01/01/2017  . Displaced fracture of left femoral neck ([place]) 01/01/2017  . Schizoaffective disorder, bipolar type ([place]) 03/19/2015    Class: Chronic    Past Surgical History:  Procedure Laterality Date  . ABDOMINAL SURGERY    . 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 TUMOR REMOVAL     Uterine tumor removed     OB History   No obstetric history on file.      Home Medications    Prior to Admission medications   Medication Sig Start Date End Date Taking? Authorizing Provider  albuterol (PROAIR HFA) 108 (90 Base) MCG/ACT inhaler Inhale 2 puffs into the lungs every 6 (six) hours as needed for wheezing or shortness of breath.    [provider]  aspirin EC 325 MG EC tablet Take 1 tablet (325 mg  total) by mouth 2 (two) times daily with a meal. 01/03/17   Constable, Amber, PA-C  budesonide-formoterol (SYMBICORT) 160-4.5 MCG/ACT inhaler Inhale 2 puffs into the lungs 2 (two) times daily.    [provider]  cephALEXin (KEFLEX) 500 MG capsule Take 1 capsule (500 mg total) by mouth 2 (two) times daily. 03/27/19   Jill Stopka, Dellis Filbert, PA-C  clonazePAM (KLONOPIN) 0.5 MG tablet Take 0.5 mg by mouth 2 (two) times daily as needed for anxiety.  08/03/17   [provider]  fluticasone (FLONASE) 50 MCG/ACT nasal spray Place 1 spray into both nostrils daily as needed for rhinitis.    [provider]  gabapentin (NEURONTIN) 300 MG capsule Take 300 mg by mouth 3 (three) times daily.    [provider]  metroNIDAZOLE (FLAGYL) 500 MG tablet Take 1 tablet (500 mg total) by mouth 2 (two) times daily. Patient not taking: Reported on 05/29/2018 02/03/18   Jonetta Osgood, MD  Multiple Vitamin (MULTIVITAMIN WITH MINERALS) TABS tablet Take 1 tablet by mouth daily.    [provider]  naproxen (NAPROSYN) 500 MG tablet Take 500 mg by mouth 3 (three) times daily as needed (pain).  08/19/17   [provider]  nicotine (NICODERM CQ - DOSED IN MG/24 HOURS) 21 mg/24hr patch Place 21 mg onto the skin daily.    [provider]  Oxcarbazepine (TRILEPTAL) 300 MG tablet Take 300 mg by  mouth 3 (three) times daily.    [provider]  risperiDONE (RISPERDAL) 1 MG tablet Take 1 mg by mouth 2 (two) times daily.    [provider]    Family History Family History  Problem Relation Age of Onset  . Cancer Mother     Social History Social History   Tobacco Use  . Smoking status: Current Every Day Smoker    Packs/day: 0.25    Types: Cigarettes, Cigars  . Smokeless tobacco: Never Used  Substance Use Topics  . Alcohol use: Yes    Comment: occ  . Drug use: Yes    Types: Cocaine     Allergies   Tetracyclines & related   Review of Systems  Review of Systems  All other systems reviewed and are negative.   Physical Exam Updated Vital Signs BP 119/69 (BP Location: Right Arm)   Pulse 72   Temp 98.1 F (36.7 C) (Oral)   Resp 16   SpO2 100%   Physical Exam Vitals signs and nursing note reviewed.  Constitutional:      Appearance: She is well-developed.  HENT:     Head: Normocephalic and atraumatic.  Eyes:     General: No scleral icterus.       Right eye: No discharge.        Left eye: No discharge.     Conjunctiva/sclera: Conjunctivae normal.     Pupils: Pupils are equal, round, and reactive to light.  Neck:     Musculoskeletal: Normal range of motion.     Vascular: No JVD.     Trachea: No tracheal deviation.  Pulmonary:     Effort: Pulmonary effort is normal.     Breath sounds: No stridor.  Abdominal:     General: There is no distension.     Palpations: Abdomen is soft.     Tenderness: There is no abdominal tenderness.  Neurological:     Mental Status: She is alert and oriented to person, place, and time.     Coordination: Coordination normal.  Psychiatric:        Behavior: Behavior normal.        Thought Content: Thought content normal.        Judgment: Judgment normal.     ED Treatments / Results  Labs (all labs ordered are listed, but only abnormal results are displayed) Labs Reviewed  URINALYSIS, ROUTINE W REFLEX MICROSCOPIC - Abnormal; Notable for the following components:      Result Value   APPearance CLOUDY (*)    Hgb urine dipstick LARGE (*)    Protein, ur 100 (*)    Leukocytes,Ua LARGE (*)    WBC, UA >50 (*)    Bacteria, UA RARE (*)    All other components within normal limits  URINE CULTURE    EKG None  Radiology No results found.  Procedures Procedures (including critical care time)  Medications Ordered in ED Medications - No data to display   Initial Impression / Assessment and Plan / ED Course  I have reviewed the triage vital signs and the nursing notes.  Pertinent  labs & imaging results that were available during my care of the patient were reviewed by me and considered in my medical decision making (see chart for details).           Assessment/Plan: 63 year old female presents today with uncomplicated urinary tract infection.  Patient discharged home with Keflex strict return precautions.  She verbalized understanding and agreement to today's plan  had no further questions or concerns at time of discharge.   Final Clinical Impressions(s) / ED Diagnoses   Final diagnoses:  Lower urinary tract infectious disease    ED Discharge Orders         Ordered    cephALEXin (KEFLEX) 500 MG capsule  2 times daily     03/27/19 1154           Eyvonne Mechanic, PA-C 03/27/19 1155    Margarita Grizzle, MD 03/27/19 [phone]

## 2019-03-27 NOTE — Discharge Instructions (Addendum)
Please read attached information. If you experience any new or worsening signs or symptoms please return to the emergency room for evaluation. Please follow-up with your primary care provider or specialist as discussed. Please use medication prescribed only as directed and discontinue taking if you have any concerning signs or symptoms.   °

## 2019-03-30 LAB — URINE CULTURE: Culture: >=100000 — AB

## 2019-03-31 ENCOUNTER — Telehealth: Payer: Self-pay | Admitting: *Deleted

## 2019-03-31 NOTE — Telephone Encounter (Signed)
Post ED Visit - Positive Culture Follow-up  Culture report reviewed by antimicrobial stewardship pharmacist: [facility] Team []  Elenor Quinones, Pharm.D. []  Heide Guile, Pharm.D., BCPS AQ-ID []  Parks Neptune, Pharm.D., BCPS []  Alycia Rossetti, Pharm.D., BCPS []  [place], [place].D., BCPS, AAHIVP []  Legrand Como, Pharm.D., BCPS, AAHIVP []  Salome Arnt, PharmD, BCPS []  Johnnette Gourd, PharmD, BCPS []  Hughes Better, PharmD, BCPS []  Leeroy Cha, PharmD []  Laqueta Linden, PharmD, BCPS []  Albertina Parr, PharmD  [place] Team []  Leodis Sias, PharmD []  Lindell Spar, PharmD []  Royetta Asal, PharmD []  Graylin Shiver, Rph []  Rema Fendt) Glennon Mac, PharmD []  Arlyn Dunning, PharmD []  Netta Cedars, PharmD []  Dia Sitter, PharmD []  Leone Haven, PharmD []  Gretta Arab, PharmD []  Theodis Shove, PharmD []  Peggyann Juba, PharmD []  Reuel Boom, PharmD   Positive urine culture Treated with Cephelexin, organism sensitive to the same and no further patient follow-up is required at this time.  Harlon Flor Asante [facility] 03/31/2019, 11:26 AM

## 2019-04-13 ENCOUNTER — Encounter (HOSPITAL_COMMUNITY): Payer: Self-pay | Admitting: Emergency Medicine

## 2019-04-13 ENCOUNTER — Emergency Department (HOSPITAL_COMMUNITY)
Admission: EM | Admit: 2019-04-13 | Discharge: 2019-04-13 | Disposition: A | Discharge status: Home / Self Care | Payer: Medicare Other | Attending: Emergency Medicine | Admitting: Emergency Medicine

## 2019-04-13 DIAGNOSIS — Z79899 Other long term (current) drug therapy: Secondary | ICD-10-CM | POA: Diagnosis not present

## 2019-04-13 DIAGNOSIS — N3090 Cystitis, unspecified without hematuria: Secondary | ICD-10-CM | POA: Insufficient documentation

## 2019-04-13 DIAGNOSIS — Z7982 Long term (current) use of aspirin: Secondary | ICD-10-CM | POA: Diagnosis not present

## 2019-04-13 DIAGNOSIS — R300 Dysuria: Secondary | ICD-10-CM | POA: Diagnosis present

## 2019-04-13 DIAGNOSIS — F17210 Nicotine dependence, cigarettes, uncomplicated: Secondary | ICD-10-CM | POA: Insufficient documentation

## 2019-04-13 LAB — CBC
HCT: 38.4 % (ref 36.0–46.0)
Hemoglobin: 12.9 g/dL (ref 12.0–15.0)
MCH: 32.1 pg (ref 26.0–34.0)
MCHC: 33.6 g/dL (ref 30.0–36.0)
MCV: 95.5 fL (ref 80.0–100.0)
Platelets: 179 10*3/uL (ref 150–400)
RBC: 4.02 MIL/uL (ref 3.87–5.11)
RDW: 14.7 % (ref 11.5–15.5)
WBC: 9.3 10*3/uL (ref 4.0–10.5)
nRBC: 0 % (ref 0.0–0.2)

## 2019-04-13 LAB — URINALYSIS, ROUTINE W REFLEX MICROSCOPIC
Bilirubin Urine: NEGATIVE
Glucose, UA: NEGATIVE mg/dL
Ketones, ur: NEGATIVE mg/dL
Nitrite: NEGATIVE
Protein, ur: 100 mg/dL — AB
Specific Gravity, Urine: 1.016 (ref 1.005–1.030)
WBC, UA: >50 WBC/hpf — ABNORMAL HIGH (ref 0–5)
pH: 6 (ref 5.0–8.0)

## 2019-04-13 LAB — BASIC METABOLIC PANEL
Anion gap: 11 (ref 5–15)
BUN: 25 mg/dL — ABNORMAL HIGH (ref 8–23)
CO2: 23 mmol/L (ref 22–32)
Calcium: 8.5 mg/dL — ABNORMAL LOW (ref 8.9–10.3)
Chloride: 99 mmol/L (ref 98–111)
Creatinine, Ser: 1.42 mg/dL — ABNORMAL HIGH (ref 0.44–1.00)
GFR calc Af Amer: 45 mL/min — ABNORMAL LOW (ref >60)
GFR calc non Af Amer: 39 mL/min — ABNORMAL LOW (ref >60)
Glucose, Bld: 125 mg/dL — ABNORMAL HIGH (ref 70–99)
Potassium: 3.5 mmol/L (ref 3.5–5.1)
Sodium: 133 mmol/L — ABNORMAL LOW (ref 135–145)

## 2019-04-13 MED ORDER — SODIUM CHLORIDE 0.9 % IV SOLN
1.0000 g | Freq: Once | INTRAVENOUS | Status: AC
  Administered 2019-04-13: 1 g via INTRAVENOUS
  Filled 2019-04-13: qty 10

## 2019-04-13 MED ORDER — KETOROLAC TROMETHAMINE 30 MG/ML IJ SOLN
15.0000 mg | Freq: Once | INTRAMUSCULAR | Status: AC
  Administered 2019-04-13: 15 mg via INTRAVENOUS
  Filled 2019-04-13: qty 1

## 2019-04-13 MED ORDER — TRAMADOL HCL 50 MG PO TABS: 50.0000 mg | ORAL_TABLET | Freq: Four times a day (QID) | ORAL | 0 refills | Status: DC | PRN

## 2019-04-13 MED ORDER — SODIUM CHLORIDE 0.9 % IV BOLUS (SEPSIS)
2000.0000 mL | Freq: Once | INTRAVENOUS | Status: AC
  Administered 2019-04-13: 2000 mL via INTRAVENOUS

## 2019-04-13 MED ORDER — SULFAMETHOXAZOLE-TRIMETHOPRIM 800-160 MG PO TABS: 1.0000 | ORAL_TABLET | Freq: Two times a day (BID) | ORAL | 0 refills | Status: AC

## 2019-04-13 MED ORDER — ONDANSETRON 4 MG PO TBDP: ORAL_TABLET | ORAL | 0 refills | Status: DC

## 2019-04-13 NOTE — ED Provider Notes (Signed)
[facility] EMERGENCY DEPARTMENT Provider Note   CSN: [id number] Arrival date & time: 04/13/19  0957     History   Chief Complaint Chief Complaint  Patient presents with  . Dysuria    HPI Terry Potter is a 63 y.o. female.     Patient complains of dysuria.   No fever chills  The history is provided by the patient. No language interpreter was used.  Dysuria Pain quality:  Aching Pain severity:  Mild Onset quality:  Sudden Timing:  Constant Progression:  Worsening Chronicity:  New Recent urinary tract infections: no   Relieved by:  Nothing Associated symptoms: no abdominal pain     Past Medical History:  Diagnosis Date  . Arthritis   . Bipolar disorder (HCC)   . Ectopic pregnancy     Patient Active Problem List   Diagnosis Date Noted  . Bacterial vaginosis 02/01/2018  . Cocaine abuse (HCC) 02/01/2018  . Pulmonary nodules 02/01/2018  . Syncope 02/01/2018  . Hip fracture, unspecified laterality, closed, initial encounter (HCC) 01/01/2017  . Hip fracture (HCC) 01/01/2017  . Displaced fracture of left femoral neck (HCC) 01/01/2017  . Schizoaffective disorder, bipolar type (HCC) 03/19/2015    Class: Chronic    Past Surgical History:  Procedure Laterality Date  . ABDOMINAL SURGERY    . 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 TUMOR REMOVAL     Uterine tumor removed     OB History   No obstetric history on file.      Home Medications    Prior to Admission medications   Medication Sig Start Date End Date Taking? Authorizing Provider  albuterol (PROAIR HFA) 108 (90 Base) MCG/ACT inhaler Inhale 2 puffs into the lungs every 6 (six) hours as needed for wheezing or shortness of breath.    [provider]  aspirin EC 325 MG EC tablet Take 1 tablet (325 mg total) by mouth 2 (two)  times daily with a meal. 01/03/17   Constable, Amber, PA-C  budesonide-formoterol (SYMBICORT) 160-4.5 MCG/ACT inhaler Inhale 2 puffs into the lungs 2 (two) times daily.    [provider]  clonazePAM (KLONOPIN) 0.5 MG tablet Take 0.5 mg by mouth 2 (two) times daily as needed for anxiety.  08/03/17   [provider]  fluticasone (FLONASE) 50 MCG/ACT nasal spray Place 1 spray into both nostrils daily as needed for rhinitis.    [provider]  gabapentin (NEURONTIN) 300 MG capsule Take 300 mg by mouth 3 (three) times daily.    [provider]  metroNIDAZOLE (FLAGYL) 500 MG tablet Take 1 tablet (500 mg total) by mouth 2 (two) times daily. Patient not taking: Reported on 05/29/2018 02/03/18   Maretta Bees, MD  Multiple Vitamin (MULTIVITAMIN WITH MINERALS) TABS tablet Take 1 tablet by mouth daily.    [provider]  naproxen (NAPROSYN) 500 MG tablet Take 500 mg by mouth 3 (three) times daily as needed (pain).  08/19/17   [provider]  nicotine (NICODERM CQ - DOSED IN MG/24 HOURS) 21 mg/24hr patch Place 21 mg onto the skin daily.    [provider]  ondansetron (ZOFRAN ODT) 4 MG disintegrating tablet 4mg  ODT q4 hours prn nausea/vomit 04/13/19   Bethann Berkshire, MD  Oxcarbazepine (TRILEPTAL) 300 MG tablet Take 300 mg by mouth 3 (three) times daily.  [provider]  risperiDONE (RISPERDAL) 1 MG tablet Take 1 mg by mouth 2 (two) times daily.    [provider]  sulfamethoxazole-trimethoprim (BACTRIM DS) 800-160 MG tablet Take 1 tablet by mouth 2 (two) times daily for 7 days. 04/13/19 04/20/19  Bethann Berkshire, MD  traMADol (ULTRAM) 50 MG tablet Take 1 tablet (50 mg total) by mouth every 6 (six) hours as needed. 04/13/19   Bethann Berkshire, MD    Family History Family History  Problem Relation Age of Onset  . Cancer Mother     Social History Social History   Tobacco Use  . Smoking status: Current Every Day Smoker     Packs/day: 0.25    Types: Cigarettes, Cigars  . Smokeless tobacco: Never Used  Substance Use Topics  . Alcohol use: Yes    Comment: occ  . Drug use: Yes    Types: Cocaine     Allergies   Tetracyclines & related   Review of Systems Review of Systems  Constitutional: Negative for appetite change and fatigue.  HENT: Negative for congestion, ear discharge and sinus pressure.   Eyes: Negative for discharge.  Respiratory: Negative for cough.   Cardiovascular: Negative for chest pain.  Gastrointestinal: Negative for abdominal pain and diarrhea.  Genitourinary: Positive for dysuria. Negative for frequency and hematuria.  Musculoskeletal: Negative for back pain.  Skin: Negative for rash.  Neurological: Negative for seizures and headaches.  Psychiatric/Behavioral: Negative for hallucinations.  Patient compliant   Physical Exam Updated Vital Signs BP 104/68 (BP Location: Right Arm)   Pulse 84   Temp 99.5 F (37.5 C) (Oral)   Resp 16   SpO2 94%   Physical Exam Vitals signs and nursing note reviewed.  Constitutional:      Appearance: She is well-developed.  HENT:     Head: Normocephalic.     Nose: Nose normal.  Eyes:     General: No scleral icterus.    Conjunctiva/sclera: Conjunctivae normal.  Neck:     Musculoskeletal: Neck supple.     Thyroid: No thyromegaly.  Cardiovascular:     Rate and Rhythm: Normal rate and regular rhythm.     Heart sounds: No murmur. No friction rub. No gallop.   Pulmonary:     Breath sounds: No stridor. No wheezing or rales.  Chest:     Chest wall: No tenderness.  Abdominal:     General: There is no distension.     Tenderness: There is no abdominal tenderness. There is no rebound.  Musculoskeletal: Normal range of motion.  Lymphadenopathy:     Cervical: No cervical adenopathy.  Skin:    Findings: No erythema or rash.  Neurological:     Mental Status: She is oriented to person, place, and time.     Motor: No abnormal muscle tone.      Coordination: Coordination normal.  Psychiatric:        Behavior: Behavior normal.      ED Treatments / Results  Labs (all labs ordered are listed, but only abnormal results are displayed) Labs Reviewed  URINALYSIS, ROUTINE W REFLEX MICROSCOPIC - Abnormal; Notable for the following components:      Result Value   Color, Urine AMBER (*)    APPearance CLOUDY (*)    Hgb urine dipstick LARGE (*)    Protein, ur 100 (*)    Leukocytes,Ua LARGE (*)    WBC, UA >50 (*)    Bacteria, UA MANY (*)    Non Squamous Epithelial 0-5 (*)  All other components within normal limits  BASIC METABOLIC PANEL - Abnormal; Notable for the following components:   Sodium 133 (*)    Glucose, Bld 125 (*)    BUN 25 (*)    Creatinine, Ser 1.42 (*)    Calcium 8.5 (*)    GFR calc non Af Amer 39 (*)    GFR calc Af Amer 45 (*)    All other components within normal limits  URINE CULTURE  CBC    EKG None  Radiology No results found.  Procedures Procedures (including critical care time)  Medications Ordered in ED Medications  sodium chloride 0.9 % bolus 2,000 mL (2,000 mLs Intravenous New Bag/Given 04/13/19 1417)  ketorolac (TORADOL) 30 MG/ML injection 15 mg (15 mg Intravenous Given 04/13/19 1406)  cefTRIAXone (ROCEPHIN) 1 g in sodium chloride 0.9 % 100 mL IVPB (1 g Intravenous New Bag/Given 04/13/19 1515)     Initial Impression / Assessment and Plan / ED Course  I have reviewed the triage vital signs and the nursing notes.  Pertinent labs & imaging results that were available during my care of the patient were reviewed by me and considered in my medical decision making (see chart for details).   Patient has a urinary tract infection will be treated with Bactrim Zofran and Ultram for discomfort.  She will follow-up with her doctor next week      Final Clinical Impressions(s) / ED Diagnoses   Final diagnoses:  Cystitis    ED Discharge Orders         Ordered    ondansetron (ZOFRAN ODT) 4  MG disintegrating tablet     04/13/19 1627    traMADol (ULTRAM) 50 MG tablet  Every 6 hours PRN     04/13/19 1627    sulfamethoxazole-trimethoprim (BACTRIM DS) 800-160 MG tablet  2 times daily     04/13/19 1627           Milton Ferguson, MD 04/17/19 1012

## 2019-04-13 NOTE — ED Triage Notes (Signed)
Pt to ER for evaluation of poor po intake, dysuria, and generalized weakness. Reports back pain. Poor historian.

## 2019-04-13 NOTE — Discharge Instructions (Addendum)
Follow-up with your doctor next week for recheck drink plenty of fluids 

## 2019-04-13 NOTE — ED Notes (Signed)
Patient Alert and oriented to baseline. Stable and ambulatory to baseline. Patient verbalized understanding of the discharge instructions.  Patient belongings were taken by the patient.   

## 2019-04-15 LAB — URINE CULTURE: Culture: >=100000 — AB

## 2019-04-16 NOTE — Progress Notes (Signed)
ED Antimicrobial Stewardship Positive Culture Follow Up   Terry Potter is an 63 y.o. female who presented to [facility] on 04/13/2019 with a chief complaint of  Chief Complaint  Patient presents with  . Dysuria   Presenting with dysuria and generalized weakness. UA positive with large leukocytes, many bacteria, and WBC>50.  Recent Results (from the past 720 hour(s))  Urine C&S     Status: Abnormal   Collection Time: 03/27/19 10:55 AM   Specimen: Urine, Clean Catch  Result Value Ref Range Status   Specimen Description URINE, CLEAN CATCH  Final   Special Requests   Final    NONE Performed at [facility], [address] [place], [place] [address]    Culture >=100,000 COLONIES/mL ESCHERICHIA COLI (A)  Final   Report Status 03/30/2019 FINAL  Final   Organism ID, Bacteria ESCHERICHIA COLI (A)  Final      Susceptibility   Escherichia coli - MIC*    AMPICILLIN >=32 RESISTANT Resistant     CEFAZOLIN <=4 SENSITIVE Sensitive     CEFTRIAXONE <=1 SENSITIVE Sensitive     CIPROFLOXACIN <=0.25 SENSITIVE Sensitive     GENTAMICIN <=1 SENSITIVE Sensitive     IMIPENEM <=0.25 SENSITIVE Sensitive     NITROFURANTOIN <=16 SENSITIVE Sensitive     TRIMETH/SULFA >=320 RESISTANT Resistant     AMPICILLIN/SULBACTAM 16 INTERMEDIATE Intermediate     PIP/TAZO <=4 SENSITIVE Sensitive     Extended ESBL NEGATIVE Sensitive     * >=100,000 COLONIES/mL ESCHERICHIA COLI  Urine Culture     Status: Abnormal   Collection Time: 04/13/19  2:02 PM   Specimen: Urine, Random  Result Value Ref Range Status   Specimen Description URINE, RANDOM  Final   Special Requests   Final    NONE Performed at [facility], [place] [address] [place], [place] [address]    Culture >=100,000 COLONIES/mL ESCHERICHIA COLI (A)  Final   Report Status 04/15/2019 FINAL  Final   Organism ID, Bacteria ESCHERICHIA COLI (A)  Final      Susceptibility   Escherichia coli - MIC*    AMPICILLIN >=32 RESISTANT Resistant    CEFAZOLIN <=4 SENSITIVE Sensitive     CEFTRIAXONE <=1 SENSITIVE Sensitive     CIPROFLOXACIN <=0.25 SENSITIVE Sensitive     GENTAMICIN <=1 SENSITIVE Sensitive     IMIPENEM <=0.25 SENSITIVE Sensitive     NITROFURANTOIN <=16 SENSITIVE Sensitive     TRIMETH/SULFA >=320 RESISTANT Resistant     AMPICILLIN/SULBACTAM 16 INTERMEDIATE Intermediate     PIP/TAZO <=4 SENSITIVE Sensitive     Extended ESBL NEGATIVE Sensitive     * >=100,000 COLONIES/mL ESCHERICHIA COLI    [x]  Treated with Bactrim, organism resistant to prescribed antimicrobial []  Patient discharged originally without antimicrobial agent and treatment is now indicated  New antibiotic prescription: Discontinue Bactrim and start cephalexin 500 mg every 12 hours for 5 days  ED Provider: Delia Heady, PA-C   Antonietta Jewel, PharmD, [place] Pharmacist  04/16/2019, 10:07 AM Clinical Pharmacist Monday - Friday phone -  [phone] Saturday - Sunday phone - [phone]

## 2019-08-03 ENCOUNTER — Ambulatory Visit: Payer: Medicare Other

## 2019-08-07 ENCOUNTER — Ambulatory Visit: Payer: Medicare Other | Attending: Family

## 2019-10-02 ENCOUNTER — Encounter (HOSPITAL_COMMUNITY): Payer: Self-pay | Admitting: Emergency Medicine

## 2019-10-02 ENCOUNTER — Other Ambulatory Visit: Payer: Self-pay

## 2019-10-02 DIAGNOSIS — Z7982 Long term (current) use of aspirin: Secondary | ICD-10-CM | POA: Insufficient documentation

## 2019-10-02 DIAGNOSIS — Z20822 Contact with and (suspected) exposure to covid-19: Secondary | ICD-10-CM | POA: Insufficient documentation

## 2019-10-02 DIAGNOSIS — F1410 Cocaine abuse, uncomplicated: Secondary | ICD-10-CM | POA: Insufficient documentation

## 2019-10-02 DIAGNOSIS — R002 Palpitations: Secondary | ICD-10-CM | POA: Diagnosis not present

## 2019-10-02 DIAGNOSIS — Z79899 Other long term (current) drug therapy: Secondary | ICD-10-CM | POA: Insufficient documentation

## 2019-10-02 DIAGNOSIS — R0789 Other chest pain: Secondary | ICD-10-CM | POA: Diagnosis not present

## 2019-10-02 DIAGNOSIS — F17290 Nicotine dependence, other tobacco product, uncomplicated: Secondary | ICD-10-CM | POA: Insufficient documentation

## 2019-10-02 DIAGNOSIS — R0602 Shortness of breath: Secondary | ICD-10-CM | POA: Insufficient documentation

## 2019-10-02 DIAGNOSIS — R531 Weakness: Secondary | ICD-10-CM | POA: Diagnosis not present

## 2019-10-02 DIAGNOSIS — R05 Cough: Secondary | ICD-10-CM | POA: Diagnosis not present

## 2019-10-02 DIAGNOSIS — Z760 Encounter for issue of repeat prescription: Secondary | ICD-10-CM | POA: Diagnosis not present

## 2019-10-02 DIAGNOSIS — Z96642 Presence of left artificial hip joint: Secondary | ICD-10-CM | POA: Insufficient documentation

## 2019-10-02 DIAGNOSIS — M791 Myalgia, unspecified site: Secondary | ICD-10-CM | POA: Insufficient documentation

## 2019-10-02 DIAGNOSIS — R519 Headache, unspecified: Secondary | ICD-10-CM | POA: Insufficient documentation

## 2019-10-02 NOTE — ED Triage Notes (Signed)
Pt arriving via EMS form home needing refill on pysch meds. Pt reports she has not had her psych meds for 3 weeks. ETOH on board.

## 2019-10-03 ENCOUNTER — Emergency Department (HOSPITAL_COMMUNITY)
Admission: EM | Admit: 2019-10-03 | Discharge: 2019-10-03 | Disposition: A | Discharge status: Home / Self Care | Payer: Medicare Other | Attending: Emergency Medicine | Admitting: Emergency Medicine

## 2019-10-03 ENCOUNTER — Emergency Department (HOSPITAL_COMMUNITY): Payer: Medicare Other

## 2019-10-03 ENCOUNTER — Encounter (HOSPITAL_COMMUNITY): Payer: Self-pay

## 2019-10-03 DIAGNOSIS — R0789 Other chest pain: Secondary | ICD-10-CM | POA: Diagnosis not present

## 2019-10-03 DIAGNOSIS — F1410 Cocaine abuse, uncomplicated: Secondary | ICD-10-CM

## 2019-10-03 LAB — TROPONIN I (HIGH SENSITIVITY)
Troponin I (High Sensitivity): <2 ng/L (ref <18)
Troponin I (High Sensitivity): <2 ng/L (ref <18)

## 2019-10-03 LAB — URINALYSIS, ROUTINE W REFLEX MICROSCOPIC
Bilirubin Urine: NEGATIVE
Glucose, UA: NEGATIVE mg/dL
Hgb urine dipstick: NEGATIVE
Ketones, ur: NEGATIVE mg/dL
Nitrite: NEGATIVE
Protein, ur: NEGATIVE mg/dL
Specific Gravity, Urine: 1.031 — ABNORMAL HIGH (ref 1.005–1.030)
pH: 5 (ref 5.0–8.0)

## 2019-10-03 LAB — COMPREHENSIVE METABOLIC PANEL
ALT: 20 U/L (ref 0–44)
AST: 27 U/L (ref 15–41)
Albumin: 3.9 g/dL (ref 3.5–5.0)
Alkaline Phosphatase: 62 U/L (ref 38–126)
Anion gap: 6 (ref 5–15)
BUN: 23 mg/dL (ref 8–23)
CO2: 27 mmol/L (ref 22–32)
Calcium: 8.5 mg/dL — ABNORMAL LOW (ref 8.9–10.3)
Chloride: 104 mmol/L (ref 98–111)
Creatinine, Ser: 0.72 mg/dL (ref 0.44–1.00)
GFR calc Af Amer: >60 mL/min (ref >60)
GFR calc non Af Amer: >60 mL/min (ref >60)
Glucose, Bld: 78 mg/dL (ref 70–99)
Potassium: 3.5 mmol/L (ref 3.5–5.1)
Sodium: 137 mmol/L (ref 135–145)
Total Bilirubin: 0.3 mg/dL (ref 0.3–1.2)
Total Protein: 7 g/dL (ref 6.5–8.1)

## 2019-10-03 LAB — CBC
HCT: 42.8 % (ref 36.0–46.0)
Hemoglobin: 14.2 g/dL (ref 12.0–15.0)
MCH: 32.4 pg (ref 26.0–34.0)
MCHC: 33.2 g/dL (ref 30.0–36.0)
MCV: 97.7 fL (ref 80.0–100.0)
Platelets: 160 10*3/uL (ref 150–400)
RBC: 4.38 MIL/uL (ref 3.87–5.11)
RDW: 14.9 % (ref 11.5–15.5)
WBC: 4.3 10*3/uL (ref 4.0–10.5)
nRBC: 0 % (ref 0.0–0.2)

## 2019-10-03 LAB — ACETAMINOPHEN LEVEL: Acetaminophen (Tylenol), Serum: <10 ug/mL — ABNORMAL LOW (ref 10–30)

## 2019-10-03 LAB — SALICYLATE LEVEL: Salicylate Lvl: <7 mg/dL — ABNORMAL LOW (ref 7.0–30.0)

## 2019-10-03 LAB — ETHANOL: Alcohol, Ethyl (B): 60 mg/dL — ABNORMAL HIGH (ref <10)

## 2019-10-03 LAB — RAPID URINE DRUG SCREEN, HOSP PERFORMED
Amphetamines: NOT DETECTED
Barbiturates: NOT DETECTED
Benzodiazepines: NOT DETECTED
Cocaine: POSITIVE — AB
Opiates: NOT DETECTED
Tetrahydrocannabinol: NOT DETECTED

## 2019-10-03 LAB — SARS CORONAVIRUS 2 (TAT 6-24 HRS): SARS Coronavirus 2: NEGATIVE

## 2019-10-03 MED ORDER — LORAZEPAM 1 MG PO TABS
1.0000 mg | ORAL_TABLET | Freq: Once | ORAL | Status: AC
  Administered 2019-10-03: 1 mg via ORAL
  Filled 2019-10-03: qty 1

## 2019-10-03 MED ORDER — PRAZOSIN HCL 2 MG PO CAPS: 2.0000 mg | ORAL_CAPSULE | Freq: Every day | ORAL | 0 refills | Status: DC

## 2019-10-03 MED ORDER — RISPERIDONE 1 MG PO TABS: 1.0000 mg | ORAL_TABLET | Freq: Two times a day (BID) | ORAL | 0 refills | Status: DC

## 2019-10-03 MED ORDER — ALBUTEROL SULFATE HFA 108 (90 BASE) MCG/ACT IN AERS: 2.0000 | INHALATION_SPRAY | Freq: Four times a day (QID) | RESPIRATORY_TRACT | 0 refills | Status: DC | PRN

## 2019-10-03 MED ORDER — GABAPENTIN 300 MG PO CAPS: 300.0000 mg | ORAL_CAPSULE | Freq: Three times a day (TID) | ORAL | 0 refills | Status: DC

## 2019-10-03 MED ORDER — IOHEXOL 350 MG/ML SOLN
100.0000 mL | Freq: Once | INTRAVENOUS | Status: AC | PRN
  Administered 2019-10-03: 80 mL via INTRAVENOUS

## 2019-10-03 MED ORDER — SODIUM CHLORIDE (PF) 0.9 % IJ SOLN
INTRAMUSCULAR | Status: AC
  Filled 2019-10-03: qty 50

## 2019-10-03 NOTE — ED Notes (Signed)
Pt is asked when did pt eat, pt states "I was eating candy a few minutes ago." This NT informed patient that pt cannot eat per RN.

## 2019-10-03 NOTE — ED Notes (Signed)
Pt on room air ambulated around the room 98-100%.

## 2019-10-03 NOTE — ED Notes (Signed)
Pt to CT before moving from Hallway bed to Room 23.

## 2019-10-03 NOTE — ED Notes (Signed)
Pt ambulatory with steady gait to bathroom

## 2019-10-03 NOTE — ED Notes (Signed)
Asked patient twice to stop eating candy. Patient denied any chest pain/sob when in triage and when in hallway bed.

## 2019-10-03 NOTE — ED Provider Notes (Signed)
[facility] DEPT Provider Note   CSN: [id number] Arrival date & time: 10/02/19  2313     History Chief Complaint  Patient presents with  . Medical Clearance  . Alcohol Intoxication    Terry Potter is a 64 y.o. female.  Patient with a history of cocaine abuse, bipolar disorder, arthritis here with multiple complaints.  She told EMS she needs refills on her psych medications.  She states she has been having shortness of breath for the past 2 weeks became acutely worse today and she felt short of breath while sitting on her porch.  She continues to smoke cigars.  She had palpitations and chest pain all day as well.  Admits to cocaine use today but states she was having chest pain before she used cocaine.  The pain is constant and worse with palpation.  She does feel short of breath.  She has a cough productive of clear mucus but no fever.  She denies any abdominal pain, nausea or vomiting.  States she has bipolar disorder and has not had her medications for several weeks.  She is not suicidal homicidal.  She admits to using alcohol today as well as cocaine.  She denies hearing any voices.  She states she is never had a heart attack or any stents in her heart.  States she uses cocaine on a regular basis  The history is provided by the patient.       Past Medical History:  Diagnosis Date  . Arthritis   . Bipolar disorder ([place])   . Ectopic pregnancy     Patient Active Problem List   Diagnosis Date Noted  . Bacterial vaginosis 02/01/2018  . Cocaine abuse ([place]) 02/01/2018  . Pulmonary nodules 02/01/2018  . Syncope 02/01/2018  . Hip fracture, unspecified laterality, closed, initial encounter ([place]) 01/01/2017  . Hip fracture ([place]) 01/01/2017  . Displaced fracture of left femoral neck ([place]) 01/01/2017  . Schizoaffective disorder, bipolar type ([place]) 03/19/2015    Class: Chronic    Past Surgical History:  Procedure Laterality Date  . ABDOMINAL  SURGERY    . 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   Procedure: TOTAL HIP ARTHROPLASTY ANTERIOR APPROACH;  Surgeon: Rod Can, MD;  Location: [facility];  Service: Orthopedics;  Laterality: Left;  . TUMOR REMOVAL     Uterine tumor removed     OB History   No obstetric history on file.     Family History  Problem Relation Age of Onset  . Cancer Mother     Social History   Tobacco Use  . Smoking status: Current Every Day Smoker    Packs/day: 0.25    Types: Cigarettes, Cigars  . Smokeless tobacco: Never Used  Substance Use Topics  . Alcohol use: Yes    Comment: occ  . Drug use: Yes    Types: Cocaine    Home Medications Prior to Admission medications   Medication Sig Start Date End Date Taking? Authorizing Provider  albuterol (PROAIR HFA) 108 (90 Base) MCG/ACT inhaler Inhale 2 puffs into the lungs every 6 (six) hours as needed for wheezing or shortness of breath.    [provider]  aspirin EC 325 MG EC tablet Take 1 tablet (325 mg total) by mouth 2 (two) times daily with a meal. 01/03/17   Constable, Amber, PA-C  budesonide-formoterol (SYMBICORT) 160-4.5 MCG/ACT inhaler Inhale 2 puffs into the lungs 2 (two) times daily.    [provider]  clonazePAM (KLONOPIN) 0.5 MG tablet  Take 0.5 mg by mouth 2 (two) times daily as needed for anxiety.  08/03/17   [provider]  fluticasone (FLONASE) 50 MCG/ACT nasal spray Place 1 spray into both nostrils daily as needed for rhinitis.    [provider]  gabapentin (NEURONTIN) 300 MG capsule Take 300 mg by mouth 3 (three) times daily.    [provider]  metroNIDAZOLE (FLAGYL) 500 MG tablet Take 1 tablet (500 mg total) by mouth 2 (two) times daily. Patient not taking: Reported on 05/29/2018 02/03/18   Maretta Bees, MD  Multiple Vitamin (MULTIVITAMIN WITH MINERALS) TABS tablet Take 1 tablet by mouth daily.    [provider]  naproxen (NAPROSYN) 500 MG tablet Take 500 mg by mouth 3  (three) times daily as needed (pain).  08/19/17   [provider]  nicotine (NICODERM CQ - DOSED IN MG/24 HOURS) 21 mg/24hr patch Place 21 mg onto the skin daily.    [provider]  ondansetron (ZOFRAN ODT) 4 MG disintegrating tablet 4mg  ODT q4 hours prn nausea/vomit 04/13/19   04/15/19, MD  Oxcarbazepine (TRILEPTAL) 300 MG tablet Take 300 mg by mouth 3 (three) times daily.    [provider]  risperiDONE (RISPERDAL) 1 MG tablet Take 1 mg by mouth 2 (two) times daily.    [provider]  traMADol (ULTRAM) 50 MG tablet Take 1 tablet (50 mg total) by mouth every 6 (six) hours as needed. 04/13/19   04/15/19, MD    Allergies    Tetracyclines & related  Review of Systems   Review of Systems  Constitutional: Negative for activity change, appetite change, fatigue and fever.  HENT: Negative for congestion and rhinorrhea.   Eyes: Negative for visual disturbance.  Respiratory: Positive for chest tightness and shortness of breath.   Cardiovascular: Positive for chest pain and palpitations.  Gastrointestinal: Negative for abdominal pain, nausea and vomiting.  Genitourinary: Negative for dysuria and hematuria.  Musculoskeletal: Positive for arthralgias and myalgias.  Neurological: Positive for weakness. Negative for headaches.   all other systems are negative except as noted in the HPI and PMH.    Physical Exam Updated Vital Signs BP (!) 92/51 (BP Location: Right Arm)   Pulse 73   Temp 97.8 F (36.6 C) (Oral)   Resp 16   SpO2 97%   Physical Exam Vitals and nursing note reviewed.  Constitutional:      General: She is not in acute distress.    Appearance: She is well-developed.     Comments: Chronically ill-appearing  HENT:     Head: Normocephalic and atraumatic.     Mouth/Throat:     Pharynx: No oropharyngeal exudate.  Eyes:     Conjunctiva/sclera: Conjunctivae normal.     Pupils: Pupils are equal, round, and reactive to light.  Neck:      Comments: No meningismus. Cardiovascular:     Rate and Rhythm: Normal rate and regular rhythm.     Heart sounds: Normal heart sounds. No murmur.     Comments: Equal radial pulses and grip strength bilaterally Pulmonary:     Effort: Pulmonary effort is normal. No respiratory distress.     Breath sounds: Normal breath sounds.     Comments: Chest pain is reproducible Chest:     Chest wall: Tenderness present.  Abdominal:     Palpations: Abdomen is soft.     Tenderness: There is no abdominal tenderness. There is no guarding or rebound.  Musculoskeletal:  General: No tenderness. Normal range of motion.     Cervical back: Normal range of motion and neck supple.  Skin:    General: Skin is warm.  Neurological:     Mental Status: She is alert and oriented to person, place, and time.     Cranial Nerves: No cranial nerve deficit.     Motor: No abnormal muscle tone.     Coordination: Coordination normal.     Comments:  5/5 strength throughout. CN 2-12 intact.Equal grip strength.   Psychiatric:        Behavior: Behavior normal.     ED Results / Procedures / Treatments   Labs (all labs ordered are listed, but only abnormal results are displayed) Labs Reviewed  COMPREHENSIVE METABOLIC PANEL - Abnormal; Notable for the following components:      Result Value   Calcium 8.5 (*)    All other components within normal limits  ETHANOL - Abnormal; Notable for the following components:   Alcohol, Ethyl (B) 60 (*)    All other components within normal limits  RAPID URINE DRUG SCREEN, HOSP PERFORMED - Abnormal; Notable for the following components:   Cocaine POSITIVE (*)    All other components within normal limits  URINALYSIS, ROUTINE W REFLEX MICROSCOPIC - Abnormal; Notable for the following components:   Color, Urine AMBER (*)    APPearance HAZY (*)    Specific Gravity, Urine 1.031 (*)    Leukocytes,Ua LARGE (*)    Bacteria, UA RARE (*)    All other components within normal limits    ACETAMINOPHEN LEVEL - Abnormal; Notable for the following components:   Acetaminophen (Tylenol), Serum <10 (*)    All other components within normal limits  SALICYLATE LEVEL - Abnormal; Notable for the following components:   Salicylate Lvl <7.0 (*)    All other components within normal limits  SARS CORONAVIRUS 2 (TAT 6-24 HRS)  CBC  TROPONIN I (HIGH SENSITIVITY)  TROPONIN I (HIGH SENSITIVITY)    EKG EKG Interpretation  Date/Time:  Tuesday October 03 2019 03:20:49 EDT Ventricular Rate:  77 PR Interval:    QRS Duration: 71 QT Interval:  388 QTC Calculation: 440 R Axis:   57 Text Interpretation: Sinus rhythm Anterior infarct, old No significant change was found Confirmed by Glynn Octave [phone]) on 10/03/2019 3:23:17 AM   Radiology DG Chest 2 View  Result Date: 10/03/2019 CLINICAL DATA:  Chest pain EXAM: CHEST - 2 VIEW COMPARISON:  05/29/2018 FINDINGS: Heart and mediastinal contours are within normal limits. No focal opacities or effusions. No acute bony abnormality. IMPRESSION: No active cardiopulmonary disease. Electronically Signed   By: Charlett Nose M.D.   On: 10/03/2019 03:08   CT Head Wo Contrast  Result Date: 10/03/2019 CLINICAL DATA:  Headache. Alcohol and cocaine use. EXAM: CT HEAD WITHOUT CONTRAST TECHNIQUE: Contiguous axial images were obtained from the base of the skull through the vertex without intravenous contrast. COMPARISON:  CT head without contrast 02/01/18 FINDINGS: Brain: Scattered foci of subcortical hypoattenuation is similar to the prior exam. Basal ganglia are intact. No acute infarct, hemorrhage, or mass lesion is present. The ventricles are of normal size. No significant extraaxial fluid collection is present. The brainstem and cerebellum are within normal limits. Vascular: Atherosclerotic calcifications are present within the cavernous internal carotid arteries bilaterally. No hyperdense vessel is present. Skull: Calvarium is intact. No focal lytic or  blastic lesions are present. No significant extracranial soft tissue lesion is present. Sinuses/Orbits: The paranasal sinuses and mastoid air cells  are clear. The globes and orbits are within normal limits. IMPRESSION: 1. No acute intracranial abnormality or significant interval change. 2. Stable mild white matter disease. This likely reflects the sequela of chronic microvascular ischemia. Electronically Signed   By: Marin Roberts M.D.   On: 10/03/2019 04:16   CT Angio Chest/Abd/Pel for Dissection W and/or Wo Contrast  Result Date: 10/03/2019 CLINICAL DATA:  Chest pain and shortness of breath. Alcohol and cocaine use. Acute aortic syndrome suspected. EXAM: CT ANGIOGRAPHY CHEST, ABDOMEN AND PELVIS TECHNIQUE: Non-contrast CT of the chest was initially obtained. Multidetector CT imaging through the chest, abdomen and pelvis was performed using the standard protocol during bolus administration of intravenous contrast. Multiplanar reconstructed images and MIPs were obtained and reviewed to evaluate the vascular anatomy. CONTRAST:  61mL OMNIPAQUE IOHEXOL 350 MG/ML SOLN COMPARISON:  CT chest 02/01/2018. CTA chest and CT abdomen and pelvis 08/30/2017 FINDINGS: CTA CHEST FINDINGS Cardiovascular: The heart size is normal. Atherosclerotic calcifications are present at the aortic arch in origin of the left subclavian artery. No displaced calcifications are present. CTA images demonstrate a 3 vessel arch configuration. Great vessels are within normal limits apart from the calcifications at the origin of the left subclavian. No aneurysm or dissection is present in chest. Arteries are unremarkable. Mediastinum/Nodes: No significant mediastinal, axillary, or hilar adenopathy is present. Thoracic inlet is within normal limits. Esophagus is unremarkable. Lungs/Pleura: Centrilobular and paraseptal emphysematous changes are noted. 7.5 mm pleural based 4 mm right upper lobe pleural-based pulmonary nodule is stable. No new  nodule, mass, or airspace disease is present. No significant pleural effusion or pneumothorax is present. Right middle lobe pulmonary nodule is stable. Musculoskeletal: The vertebral body heights and alignment are normal. No focal lytic or blastic lesions are evident. Review of the MIP images confirms the above findings. CTA ABDOMEN AND PELVIS FINDINGS VASCULAR Aorta: Mild atherosclerotic changes are present without aneurysm or dissection. Celiac: Minimal irregularity is present at the origin without a significant stenosis. Branch vessels are within normal limits. SMA: Patent without evidence of aneurysm, dissection, vasculitis or significant stenosis. Renals: Both renal arteries are patent without evidence of aneurysm, dissection, vasculitis, fibromuscular dysplasia or significant stenosis. IMA: Patent without evidence of aneurysm, dissection, vasculitis or significant stenosis. Inflow: Atherosclerotic changes are present in the proximal iliac arteries without significant stenosis or aneurysm. Veins: No obvious venous abnormality within the limitations of this arterial phase study. Review of the MIP images confirms the above findings. NON-VASCULAR Hepatobiliary: No focal liver abnormality is seen. No gallstones, gallbladder wall thickening, or biliary dilatation. Pancreas: Unremarkable. No pancreatic ductal dilatation or surrounding inflammatory changes. Spleen: Normal in size without focal abnormality. Adrenals/Urinary Tract: Adrenal glands are normal bilaterally. Arterial phase imaging of the kidneys is within normal limits. Ureters are not well visualized. Urinary bladder is mostly collapsed. Stomach/Bowel: The stomach and duodenum are within normal limits. Small bowel is unremarkable. Moderate stool is present throughout the colon. Lymphatic: No significant retroperitoneal adenopathy is present. Reproductive: Status post hysterectomy. No adnexal masses. Other: Significant free fluid or free air is present.  Musculoskeletal: Grade 1 degenerative anterolisthesis is present at L4-5 and L5-S1 with advanced facet arthropathy. Central and foraminal stenosis is present at both levels. Endplate changes are present without other focal lytic or blastic lesions. Degenerative S side joint changes are noted bilaterally. Left total hip arthroplasty is present. Right hip is unremarkable. Review of the MIP images confirms the above findings. IMPRESSION: 1. No aortic dissection or aneurysm. 2. Stable right middle lobe and  upper lobe pulmonary nodules over 2 years. No follow-up necessary. 3. Aortic Atherosclerosis (ICD10-I70.0) and Emphysema (ICD10-J43.9). 4. No acute or focal lesion to explain chest pain or shortness of breath. Electronically Signed   By: Marin Roberts M.D.   On: 10/03/2019 05:09    Procedures Procedures (including critical care time)  Medications Ordered in ED Medications - No data to display  ED Course  I have reviewed the triage vital signs and the nursing notes.  Pertinent labs & imaging results that were available during my care of the patient were reviewed by me and considered in my medical decision making (see chart for details).    MDM Rules/Calculators/A&P                      Patient here with palpitations, shortness of breath and chest pain.  States she has been short of breath for the past 2 weeks and develop palpitations and chest pain today.  Does admit to cocaine use.  EKG is sinus tachycardia without evidence of acute ST changes.  Chest pain is reproducible.  Patient is not wheezing.  She does admit to cocaine use earlier today with troponin is negative.  She received p.o. Ativan as well as a albuterol treatment.  Work-up shows no acute pathology.  Troponins remain negative in setting of several hours of constant pain.  Low suspicion for ACS, PE, or dissection.  Patient able to ambulate without desaturation.  She is not suicidal or homicidal..  She is instructed to stop  using cocaine. She is requesting refills of her chronic medications which were provided.  Followup with PCP.  Return precautions discussed.  Final Clinical Impression(s) / ED Diagnoses Final diagnoses:  Atypical chest pain  Cocaine abuse [facility])    Rx / DC Orders ED Discharge Orders    None       Emileigh Kellett, Jeannett Senior, MD 10/03/19 1626

## 2019-10-03 NOTE — Discharge Instructions (Signed)
Stop using cocaine.  Take medications as prescribed.  There is no evidence of heart attack or blood clot in the lung.  Follow-up with your primary doctor.  Return to the ED develop new or worsening symptoms

## 2020-02-06 ENCOUNTER — Encounter (HOSPITAL_COMMUNITY): Payer: Self-pay

## 2020-02-06 ENCOUNTER — Other Ambulatory Visit: Payer: Self-pay

## 2020-02-06 DIAGNOSIS — R197 Diarrhea, unspecified: Secondary | ICD-10-CM | POA: Diagnosis not present

## 2020-02-06 DIAGNOSIS — Z87891 Personal history of nicotine dependence: Secondary | ICD-10-CM | POA: Diagnosis not present

## 2020-02-06 DIAGNOSIS — R1084 Generalized abdominal pain: Secondary | ICD-10-CM | POA: Diagnosis present

## 2020-02-06 DIAGNOSIS — Z7982 Long term (current) use of aspirin: Secondary | ICD-10-CM | POA: Diagnosis not present

## 2020-02-06 DIAGNOSIS — R112 Nausea with vomiting, unspecified: Secondary | ICD-10-CM | POA: Insufficient documentation

## 2020-02-06 DIAGNOSIS — Z79899 Other long term (current) drug therapy: Secondary | ICD-10-CM | POA: Insufficient documentation

## 2020-02-06 LAB — CBC
HCT: 42.5 % (ref 36.0–46.0)
Hemoglobin: 14.1 g/dL (ref 12.0–15.0)
MCH: 31.6 pg (ref 26.0–34.0)
MCHC: 33.2 g/dL (ref 30.0–36.0)
MCV: 95.3 fL (ref 80.0–100.0)
Platelets: 259 10*3/uL (ref 150–400)
RBC: 4.46 MIL/uL (ref 3.87–5.11)
RDW: 14.3 % (ref 11.5–15.5)
WBC: 5.6 10*3/uL (ref 4.0–10.5)
nRBC: 0 % (ref 0.0–0.2)

## 2020-02-06 LAB — COMPREHENSIVE METABOLIC PANEL WITH GFR
ALT: 27 U/L (ref 0–44)
AST: 30 U/L (ref 15–41)
Albumin: 3.8 g/dL (ref 3.5–5.0)
Alkaline Phosphatase: 53 U/L (ref 38–126)
Anion gap: 9 (ref 5–15)
BUN: 21 mg/dL (ref 8–23)
CO2: 24 mmol/L (ref 22–32)
Calcium: 8.8 mg/dL — ABNORMAL LOW (ref 8.9–10.3)
Chloride: 106 mmol/L (ref 98–111)
Creatinine, Ser: 0.79 mg/dL (ref 0.44–1.00)
GFR calc Af Amer: >60 mL/min
GFR calc non Af Amer: >60 mL/min
Glucose, Bld: 92 mg/dL (ref 70–99)
Potassium: 3.7 mmol/L (ref 3.5–5.1)
Sodium: 139 mmol/L (ref 135–145)
Total Bilirubin: 0.6 mg/dL (ref 0.3–1.2)
Total Protein: 7.3 g/dL (ref 6.5–8.1)

## 2020-02-06 LAB — LIPASE, BLOOD: Lipase: 25 U/L (ref 11–51)

## 2020-02-06 NOTE — ED Triage Notes (Signed)
Pt arrives POV from home with complaints of abd pain beginning Friday and diarrhea,nausea,vomiting Saturday.

## 2020-02-07 ENCOUNTER — Emergency Department (HOSPITAL_COMMUNITY)
Admission: EM | Admit: 2020-02-07 | Discharge: 2020-02-07 | Disposition: A | Discharge status: Home / Self Care | Payer: Medicare Other | Attending: Emergency Medicine | Admitting: Emergency Medicine

## 2020-02-07 DIAGNOSIS — R197 Diarrhea, unspecified: Secondary | ICD-10-CM

## 2020-02-07 DIAGNOSIS — R109 Unspecified abdominal pain: Secondary | ICD-10-CM

## 2020-02-07 DIAGNOSIS — R112 Nausea with vomiting, unspecified: Secondary | ICD-10-CM

## 2020-02-07 MED ORDER — ONDANSETRON 4 MG PO TBDP: 4.0000 mg | ORAL_TABLET | Freq: Three times a day (TID) | ORAL | 0 refills | Status: AC | PRN

## 2020-02-07 NOTE — ED Provider Notes (Signed)
[facility]-EMERGENCY DEPT Provider Note  CSN: [id number] Arrival date & time: 02/06/20 1733  Chief Complaint(s) Abdominal Pain and Diarrhea  HPI Terry Potter is a 64 y.o. female   The history is provided by the patient.  Abdominal Pain Pain location:  Generalized Pain quality: cramping   Pain radiates to:  Does not radiate Pain severity:  Moderate Onset quality:  Gradual Duration:  1 week Timing:  Intermittent Progression:  Waxing and waning Chronicity:  Recurrent Context: not recent illness, not sick contacts and not suspicious food intake   Relieved by:  Nothing Worsened by:  Nothing Associated symptoms: diarrhea, nausea and vomiting   Associated symptoms: no anorexia, no chills, no constipation, no fever, no hematemesis and no hematochezia   Diarrhea Associated symptoms: abdominal pain and vomiting   Associated symptoms: no chills and no fever     Past Medical History Past Medical History:  Diagnosis Date  . Arthritis   . Bipolar disorder (HCC)   . Ectopic pregnancy    Patient Active Problem List   Diagnosis Date Noted  . Bacterial vaginosis 02/01/2018  . Cocaine abuse (HCC) 02/01/2018  . Pulmonary nodules 02/01/2018  . Syncope 02/01/2018  . Hip fracture, unspecified laterality, closed, initial encounter (HCC) 01/01/2017  . Hip fracture (HCC) 01/01/2017  . Displaced fracture of left femoral neck (HCC) 01/01/2017  . Schizoaffective disorder, bipolar type (HCC) 03/19/2015    Class: Chronic   Home Medication(s) Prior to Admission medications   Medication Sig Start Date End Date Taking? Authorizing Provider  albuterol (PROAIR HFA) 108 (90 Base) MCG/ACT inhaler Inhale 2 puffs into the lungs every 6 (six) hours as needed for wheezing or shortness of breath. 10/03/19   Glynn Octave, MD  aspirin EC 325 MG EC tablet Take 1 tablet (325 mg total) by mouth 2 (two) times daily with a meal. 01/03/17   Constable, Amber, PA-C    budesonide-formoterol (SYMBICORT) 160-4.5 MCG/ACT inhaler Inhale 2 puffs into the lungs 2 (two) times daily.    [provider]  fluticasone (FLONASE) 50 MCG/ACT nasal spray Place 1 spray into both nostrils daily as needed for rhinitis.    [provider]  gabapentin (NEURONTIN) 300 MG capsule Take 1 capsule (300 mg total) by mouth 3 (three) times daily. 10/03/19   Rancour, Jeannett Senior, MD  metroNIDAZOLE (FLAGYL) 500 MG tablet Take 1 tablet (500 mg total) by mouth 2 (two) times daily. Patient not taking: Reported on 05/29/2018 02/03/18   Maretta Bees, MD  naproxen (NAPROSYN) 500 MG tablet Take 500 mg by mouth 2 (two) times daily as needed for mild pain.    [provider]  ondansetron (ZOFRAN ODT) 4 MG disintegrating tablet 4mg  ODT q4 hours prn nausea/vomit Patient not taking: Reported on 10/03/2019 04/13/19   Bethann Berkshire, MD  Oxcarbazepine (TRILEPTAL) 300 MG tablet Take 300 mg by mouth 3 (three) times daily.    [provider]  prazosin (MINIPRESS) 2 MG capsule Take 1 capsule (2 mg total) by mouth at bedtime. 10/03/19   Rancour, Jeannett Senior, MD  risperiDONE (RISPERDAL) 1 MG tablet Take 1 tablet (1 mg total) by mouth 2 (two) times daily. 10/03/19   Rancour, Jeannett Senior, MD  traMADol (ULTRAM) 50 MG tablet Take 1 tablet (50 mg total) by mouth every 6 (six) hours as needed. Patient not taking: Reported on 10/03/2019 04/13/19   Bethann Berkshire, MD  Past Surgical History Past Surgical History:  Procedure Laterality Date  . ABDOMINAL SURGERY    . 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 TUMOR REMOVAL     Uterine tumor removed   Family History Family History  Problem Relation Age of Onset  . Cancer  Mother     Social History Social History   Tobacco Use  . Smoking status: Current Every Day Smoker    Packs/day: 0.25    Types: Cigarettes, Cigars  . Smokeless tobacco: Never Used  Substance Use Topics  . Alcohol use: Yes    Comment: occ  . Drug use: Yes    Types: Cocaine    Comment: occ   Allergies Tetracyclines & related  Review of Systems Review of Systems  Constitutional: Negative for chills and fever.  Gastrointestinal: Positive for abdominal pain, diarrhea, nausea and vomiting. Negative for anorexia, constipation, hematemesis and hematochezia.   All other systems are reviewed and are negative for acute change except as noted in the HPI  Physical Exam Vital Signs  I have reviewed the triage vital signs BP 123/74   Pulse 75   Temp 98.2 F (36.8 C)   Resp 18   Ht 5\' 4"  (1.626 m)   Wt 40.8 kg   SpO2 100%   BMI 15.45 kg/m   Physical Exam Vitals reviewed.  Constitutional:      General: She is not in acute distress.    Appearance: She is well-developed. She is not diaphoretic.  HENT:     Head: Normocephalic and atraumatic.     Right Ear: External ear normal.     Left Ear: External ear normal.     Nose: Nose normal.  Eyes:     General: No scleral icterus.    Conjunctiva/sclera: Conjunctivae normal.  Neck:     Trachea: Phonation normal.  Cardiovascular:     Rate and Rhythm: Normal rate and regular rhythm.  Pulmonary:     Effort: Pulmonary effort is normal. No respiratory distress.     Breath sounds: No stridor.  Abdominal:     General: There is no distension.     Tenderness: There is no abdominal tenderness. There is no guarding or rebound.  Musculoskeletal:        General: Normal range of motion.     Cervical back: Normal range of motion.  Neurological:     Mental Status: She is alert and oriented to person, place, and time.  Psychiatric:        Behavior: Behavior normal.     ED Results and Treatments Labs (all labs ordered are listed, but  only abnormal results are displayed) Labs Reviewed  COMPREHENSIVE METABOLIC PANEL - Abnormal; Notable for the following components:      Result Value   Calcium 8.8 (*)    All other components within normal limits  LIPASE, BLOOD  CBC  URINALYSIS, ROUTINE W REFLEX MICROSCOPIC  EKG  EKG Interpretation  Date/Time:    Ventricular Rate:    PR Interval:    QRS Duration:   QT Interval:    QTC Calculation:   R Axis:     Text Interpretation:        Radiology No results found.  Pertinent labs & imaging results that were available during my care of the patient were reviewed by me and considered in my medical decision making (see chart for details).  Medications Ordered in ED Medications - No data to display                                                                                                                                  Procedures Procedures  (including critical care time)  Medical Decision Making / ED Course I have reviewed the nursing notes for this encounter and the patient's prior records (if available in EHR or on provided paperwork).   Terry Potter was evaluated in Emergency Department on 02/07/2020 for the symptoms described in the history of present illness. She was evaluated in the context of the global COVID-19 pandemic, which necessitated consideration that the patient might be at risk for infection with the SARS-CoV-2 virus that causes COVID-19. Institutional protocols and algorithms that pertain to the evaluation of patients at risk for COVID-19 are in a state of rapid change based on information released by regulatory bodies including the CDC and federal and state organizations. These policies and algorithms were followed during the patient's care in the ED.  64 y.o. female presents with vomiting, diarrhea, and abdominal discomfort for  6-7 days. No known possible suspicious food intake.  decreased oral tolerance. Rest of history as above.  Patient appears well, not in distress, and with no signs of toxicity or dehydration. Abdomen benign.  Rest of the exam as above  Labs reassuring without leukocytosis or anemia.  No significant electrolyte derangement or renal insufficiency.  No evidence of biliary obstruction or pancreatitis.  Most consistent with viral gastroenteritis.   Doubt appendicitis, diverticulitis, severe colitis, dysentery.    Patient is requesting food. Able to tolerate oral intake in the ED w/o meds.  Discussed symptomatic treatment with the patient and they will follow closely with their PCP.       Final Clinical Impression(s) / ED Diagnoses Final diagnoses:  None    The patient appears reasonably screened and/or stabilized for discharge and I doubt any other medical condition or other [facility] requiring further screening, evaluation, or treatment in the ED at this time prior to discharge. Safe for discharge with strict return precautions.  Disposition: Discharge  Condition: Good  I have discussed the results, Dx and Tx plan with the patient/family who expressed understanding and agree(s) with the plan. Discharge instructions discussed at length. The patient/family was given strict return precautions who verbalized understanding of the instructions. No further questions at time of discharge.    ED Discharge Orders  Ordered    ondansetron (ZOFRAN ODT) 4 MG disintegrating tablet  Every 8 hours PRN        02/07/20 0151            Follow Up: Primary care provider  Schedule an appointment as soon as possible for a visit  As needed     This chart was dictated using voice recognition software.  Despite best efforts to proofread,  errors can occur which can change the documentation meaning.   Nira Conn, MD 02/07/20 [phone]

## 2020-05-21 ENCOUNTER — Emergency Department (HOSPITAL_COMMUNITY): Payer: Medicare Other

## 2020-05-21 ENCOUNTER — Emergency Department (HOSPITAL_COMMUNITY)
Admission: EM | Admit: 2020-05-21 | Discharge: 2020-05-21 | Disposition: A | Discharge status: Home / Self Care | Payer: Medicare Other | Attending: Emergency Medicine | Admitting: Emergency Medicine

## 2020-05-21 ENCOUNTER — Other Ambulatory Visit: Payer: Self-pay

## 2020-05-21 DIAGNOSIS — Z79899 Other long term (current) drug therapy: Secondary | ICD-10-CM | POA: Insufficient documentation

## 2020-05-21 DIAGNOSIS — R55 Syncope and collapse: Secondary | ICD-10-CM | POA: Insufficient documentation

## 2020-05-21 DIAGNOSIS — R791 Abnormal coagulation profile: Secondary | ICD-10-CM | POA: Insufficient documentation

## 2020-05-21 DIAGNOSIS — R072 Precordial pain: Secondary | ICD-10-CM | POA: Insufficient documentation

## 2020-05-21 DIAGNOSIS — Z7982 Long term (current) use of aspirin: Secondary | ICD-10-CM | POA: Insufficient documentation

## 2020-05-21 DIAGNOSIS — F17210 Nicotine dependence, cigarettes, uncomplicated: Secondary | ICD-10-CM | POA: Diagnosis not present

## 2020-05-21 DIAGNOSIS — R059 Cough, unspecified: Secondary | ICD-10-CM | POA: Diagnosis not present

## 2020-05-21 DIAGNOSIS — J449 Chronic obstructive pulmonary disease, unspecified: Secondary | ICD-10-CM | POA: Diagnosis not present

## 2020-05-21 DIAGNOSIS — R0602 Shortness of breath: Secondary | ICD-10-CM | POA: Diagnosis not present

## 2020-05-21 DIAGNOSIS — R42 Dizziness and giddiness: Secondary | ICD-10-CM | POA: Insufficient documentation

## 2020-05-21 DIAGNOSIS — F319 Bipolar disorder, unspecified: Secondary | ICD-10-CM | POA: Diagnosis not present

## 2020-05-21 DIAGNOSIS — F17290 Nicotine dependence, other tobacco product, uncomplicated: Secondary | ICD-10-CM | POA: Diagnosis not present

## 2020-05-21 DIAGNOSIS — R079 Chest pain, unspecified: Secondary | ICD-10-CM | POA: Diagnosis present

## 2020-05-21 LAB — URINALYSIS, ROUTINE W REFLEX MICROSCOPIC
Bacteria, UA: NONE SEEN
Bilirubin Urine: NEGATIVE
Glucose, UA: NEGATIVE mg/dL
Hgb urine dipstick: NEGATIVE
Ketones, ur: NEGATIVE mg/dL
Nitrite: NEGATIVE
Protein, ur: NEGATIVE mg/dL
Specific Gravity, Urine: >1.046 — ABNORMAL HIGH (ref 1.005–1.030)
pH: 5 (ref 5.0–8.0)

## 2020-05-21 LAB — CBC
HCT: 37.2 % (ref 36.0–46.0)
Hemoglobin: 12.4 g/dL (ref 12.0–15.0)
MCH: 31.9 pg (ref 26.0–34.0)
MCHC: 33.3 g/dL (ref 30.0–36.0)
MCV: 95.6 fL (ref 80.0–100.0)
Platelets: 204 10*3/uL (ref 150–400)
RBC: 3.89 MIL/uL (ref 3.87–5.11)
RDW: 13.9 % (ref 11.5–15.5)
WBC: 3.9 10*3/uL — ABNORMAL LOW (ref 4.0–10.5)
nRBC: 0 % (ref 0.0–0.2)

## 2020-05-21 LAB — BASIC METABOLIC PANEL
Anion gap: 11 (ref 5–15)
BUN: 20 mg/dL (ref 8–23)
CO2: 22 mmol/L (ref 22–32)
Calcium: 8.8 mg/dL — ABNORMAL LOW (ref 8.9–10.3)
Chloride: 106 mmol/L (ref 98–111)
Creatinine, Ser: 0.9 mg/dL (ref 0.44–1.00)
GFR, Estimated: >60 mL/min (ref >60)
Glucose, Bld: 100 mg/dL — ABNORMAL HIGH (ref 70–99)
Potassium: 3.8 mmol/L (ref 3.5–5.1)
Sodium: 139 mmol/L (ref 135–145)

## 2020-05-21 LAB — TROPONIN I (HIGH SENSITIVITY)
Troponin I (High Sensitivity): 2 ng/L (ref <18)
Troponin I (High Sensitivity): 3 ng/L (ref <18)

## 2020-05-21 LAB — RAPID URINE DRUG SCREEN, HOSP PERFORMED
Amphetamines: NOT DETECTED
Barbiturates: NOT DETECTED
Benzodiazepines: NOT DETECTED
Cocaine: POSITIVE — AB
Opiates: NOT DETECTED
Tetrahydrocannabinol: NOT DETECTED

## 2020-05-21 LAB — D-DIMER, QUANTITATIVE: D-Dimer, Quant: 0.66 ug/mL-FEU — ABNORMAL HIGH (ref 0.00–0.50)

## 2020-05-21 MED ORDER — IOHEXOL 350 MG/ML SOLN
60.0000 mL | Freq: Once | INTRAVENOUS | Status: AC | PRN
  Administered 2020-05-21: 60 mL via INTRAVENOUS

## 2020-05-21 MED ORDER — LACTATED RINGERS IV BOLUS
250.0000 mL | Freq: Once | INTRAVENOUS | Status: AC
  Administered 2020-05-21: 250 mL via INTRAVENOUS

## 2020-05-21 NOTE — Discharge Instructions (Addendum)
Please read and follow all provided instructions.  Your diagnoses today include:  1. Precordial pain   2. Syncope, unspecified syncope type    Your urine showed that you are dehydrated.  Please make sure you are drinking plenty of water.  Dehydration can make you more likely to pass out.  Tests performed today include: An EKG of your heart A chest x-ray Cardiac enzymes - a blood test for heart muscle damage Blood counts and electrolytes CT scan of your chest Vital signs. See below for your results today.   Medications prescribed:  None  Take any prescribed medications only as directed.  Follow-up instructions: Please follow-up with your primary care provider as soon as you can for further evaluation of your symptoms and to discuss chronic medications.   Return instructions:  SEEK IMMEDIATE MEDICAL ATTENTION IF: You have severe chest pain, especially if the pain is crushing or pressure-like and spreads to the arms, back, neck, or jaw, or if you have sweating, nausea (feeling sick to your stomach), or shortness of breath. THIS IS AN EMERGENCY. Don't wait to see if the pain will go away. Get medical help at once. Call 911 or 0 (operator). DO NOT drive yourself to the hospital.  Your chest pain gets worse and does not go away with rest.  You have an attack of chest pain lasting longer than usual, despite rest and treatment with the medications your caregiver has prescribed.  You wake from sleep with chest pain or shortness of breath. You feel dizzy or faint. You have chest pain not typical of your usual pain for which you originally saw your caregiver.  You have any other emergent concerns regarding your health.  Additional Information: Chest pain comes from many different causes. Your caregiver has diagnosed you as having chest pain that is not specific for one problem, but does not require admission.  You are at low risk for an acute heart condition or other serious illness.   Your  vital signs today were: BP (!) 102/55   Pulse 75   Temp 97.8 F (36.6 C) (Oral)   Resp (!) 23   SpO2 99%  If your blood pressure (BP) was elevated above 135/85 this visit, please have this repeated by your doctor within one month. --------------

## 2020-05-21 NOTE — ED Notes (Signed)
RN notified about vitals 

## 2020-05-21 NOTE — ED Provider Notes (Signed)
[facility] EMERGENCY DEPARTMENT Provider Note   CSN: [id number] Arrival date & time: 05/21/20  4098     History Chief Complaint  Patient presents with  . Chest Pain  . Loss of Consciousness    Terry Potter is a 64 y.o. female.  Patient with history of bipolar disorder, substance abuse, COPD -- presents with complaint of chest pain. She describes intermittent waves of sharp pain in her left chest for the past 3 weeks. She reports two episodes of syncope most recently 2 days ago.  She reports a prodrome of dizziness and then waking up on the ground.  No associated nausea, vomiting, diarrhea.  No lower extremity swelling.  She has had a productive cough over the past several days without fever.  No URI symptoms otherwise.  She reports being out of her bipolar meds for the past 4 months.  She states that she would have come to the emergency department sooner but has had to care for a sick family member.        Past Medical History:  Diagnosis Date  . Arthritis   . Bipolar disorder (HCC)   . Ectopic pregnancy     Patient Active Problem List   Diagnosis Date Noted  . Bacterial vaginosis 02/01/2018  . Cocaine abuse (HCC) 02/01/2018  . Pulmonary nodules 02/01/2018  . Syncope 02/01/2018  . Hip fracture, unspecified laterality, closed, initial encounter (HCC) 01/01/2017  . Hip fracture (HCC) 01/01/2017  . Displaced fracture of left femoral neck (HCC) 01/01/2017  . Schizoaffective disorder, bipolar type (HCC) 03/19/2015    Class: Chronic    Past Surgical History:  Procedure Laterality Date  . ABDOMINAL SURGERY    . 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 TUMOR REMOVAL     Uterine tumor removed     OB History   No obstetric history on file.     Family History  Problem Relation Age of Onset  . Cancer  Mother     Social History   Tobacco Use  . Smoking status: Current Every Day Smoker    Packs/day: 0.25    Types: Cigarettes, Cigars  . Smokeless tobacco: Never Used  Substance Use Topics  . Alcohol use: Yes    Comment: occ  . Drug use: Yes    Types: Cocaine    Comment: occ    Home Medications Prior to Admission medications   Medication Sig Start Date End Date Taking? Authorizing Provider  albuterol (PROAIR HFA) 108 (90 Base) MCG/ACT inhaler Inhale 2 puffs into the lungs every 6 (six) hours as needed for wheezing or shortness of breath. 10/03/19   Glynn Octave, MD  aspirin EC 325 MG EC tablet Take 1 tablet (325 mg total) by mouth 2 (two) times daily with a meal. 01/03/17   Constable, Amber, PA-C  budesonide-formoterol (SYMBICORT) 160-4.5 MCG/ACT inhaler Inhale 2 puffs into the lungs 2 (two) times daily.    [provider]  fluticasone (FLONASE) 50 MCG/ACT nasal spray Place 1 spray into both nostrils daily as needed for rhinitis.    [provider]  gabapentin (NEURONTIN) 300 MG capsule Take 1 capsule (300 mg total) by mouth 3 (three) times daily. 10/03/19   Rancour, Jeannett Senior, MD  metroNIDAZOLE (FLAGYL) 500 MG tablet Take 1 tablet (500 mg total) by mouth 2 (two) times daily. Patient not taking: Reported on  05/29/2018 02/03/18   Ghimire, Werner Lean, MD  naproxen (NAPROSYN) 500 MG tablet Take 500 mg by mouth 2 (two) times daily as needed for mild pain.    [provider]  Oxcarbazepine (TRILEPTAL) 300 MG tablet Take 300 mg by mouth 3 (three) times daily.    [provider]  prazosin (MINIPRESS) 2 MG capsule Take 1 capsule (2 mg total) by mouth at bedtime. 10/03/19   Rancour, Jeannett Senior, MD  risperiDONE (RISPERDAL) 1 MG tablet Take 1 tablet (1 mg total) by mouth 2 (two) times daily. 10/03/19   Rancour, Jeannett Senior, MD  traMADol (ULTRAM) 50 MG tablet Take 1 tablet (50 mg total) by mouth every 6 (six) hours as needed. Patient not taking: Reported on 10/03/2019  04/13/19   Bethann Berkshire, MD    Allergies    Tetracyclines & related  Review of Systems   Review of Systems  Constitutional: Negative for diaphoresis and fever.  HENT: Negative for rhinorrhea and sore throat.   Eyes: Negative for redness.  Respiratory: Positive for shortness of breath. Negative for cough.   Cardiovascular: Positive for chest pain. Negative for palpitations and leg swelling.  Gastrointestinal: Negative for abdominal pain, diarrhea, nausea and vomiting.  Genitourinary: Negative for dysuria, frequency, hematuria and urgency.  Musculoskeletal: Negative for back pain, myalgias and neck pain.  Skin: Negative for rash.  Neurological: Positive for syncope and light-headedness. Negative for headaches.  Psychiatric/Behavioral: The patient is not nervous/anxious.     Physical Exam Updated Vital Signs BP 98/63 (BP Location: Right Arm)   Pulse 72   Temp 97.8 F (36.6 C) (Oral)   Resp 20   SpO2 100%   Physical Exam Vitals and nursing note reviewed.  Constitutional:      General: She is not in acute distress.    Appearance: She is well-developed and underweight.  HENT:     Head: Normocephalic and atraumatic.     Right Ear: External ear normal.     Left Ear: External ear normal.     Nose: Nose normal.  Eyes:     Conjunctiva/sclera: Conjunctivae normal.  Cardiovascular:     Rate and Rhythm: Normal rate and regular rhythm.     Heart sounds: No murmur heard.   Pulmonary:     Effort: No respiratory distress.     Breath sounds: No wheezing, rhonchi or rales.  Abdominal:     Palpations: Abdomen is soft.     Tenderness: There is no abdominal tenderness. There is no guarding or rebound.  Musculoskeletal:     Cervical back: Normal range of motion and neck supple.     Right lower leg: No edema.     Left lower leg: No edema.  Skin:    General: Skin is warm and dry.     Findings: No rash.  Neurological:     General: No focal deficit present.     Mental Status: She  is alert. Mental status is at baseline.     Motor: No weakness.  Psychiatric:        Mood and Affect: Mood normal.     ED Results / Procedures / Treatments   Labs (all labs ordered are listed, but only abnormal results are displayed) Labs Reviewed  BASIC METABOLIC PANEL - Abnormal; Notable for the following components:      Result Value   Glucose, Bld 100 (*)    Calcium 8.8 (*)    All other components within normal limits  CBC - Abnormal; Notable for the  following components:   WBC 3.9 (*)    All other components within normal limits  D-DIMER, QUANTITATIVE (NOT AT [facility]) - Abnormal; Notable for the following components:   D-Dimer, Quant 0.66 (*)    All other components within normal limits  RAPID URINE DRUG SCREEN, HOSP PERFORMED  URINALYSIS, ROUTINE W REFLEX MICROSCOPIC  TROPONIN I (HIGH SENSITIVITY)  TROPONIN I (HIGH SENSITIVITY)    ED ECG REPORT   Date: 05/21/2020  Rate: 78  Rhythm: normal sinus rhythm  QRS Axis: right  Intervals: normal  ST/T Wave abnormalities: normal  Conduction Disutrbances:left posterior fascicular block  Narrative Interpretation:   Old EKG Reviewed: unchanged  I have personally reviewed the EKG tracing and agree with the computerized printout as noted.  Radiology DG Chest 2 View  Result Date: 05/21/2020 CLINICAL DATA:  Chest pain EXAM: CHEST - 2 VIEW COMPARISON:  10/03/2019 FINDINGS: There is hyperinflation of the lungs compatible with COPD. Heart and mediastinal contours are within normal limits. No focal opacities or effusions. No acute bony abnormality. IMPRESSION: COPD.  No active disease. Electronically Signed   By: Charlett Nose M.D.   On: 05/21/2020 11:21    Procedures Procedures (including critical care time)  Medications Ordered in ED Medications - No data to display  ED Course  I have reviewed the triage vital signs and the nursing notes.  Pertinent labs & imaging results that were available during my care of the patient were  reviewed by me and considered in my medical decision making (see chart for details).  Patient seen and examined. Work-up initiated.  Blood pressure low on arrival, improved on recheck.  Will obtain orthostatics.  Troponin is negative x1.  Chest x-ray without active disease.  Given reported episodes of syncope, D-dimer ordered.  Will ambulate patient given reported shortness of breath.  Despite low blood pressures, she is able to stand without difficulty and appears to be in no acute distress.  She is a very thin lady with likely an element of hypotension at baseline.  Vital signs reviewed and are as follows: BP 98/63 (BP Location: Right Arm)   Pulse 72   Temp 97.8 F (36.6 C) (Oral)   Resp 20   SpO2 100%   3:29 PM Patient has been seen by Dr. Judd Lien who agrees with plan.  D-dimer is mildly elevated awaiting CT imaging of the chest.  Blood pressures have improved from arrival without any treatments.  Patient rechecked.  She has received CT scan and we are currently awaiting results.   Signout to [employer] at shift change.  Plan: follow-up on imaging results. Would like orthostatics and ambulation.  Patient states that she is seen at the [facility].  She states that she has breathing medications at home but likely has a low supply.  She is strongly encouraged to follow-up with her primary care doctor in regards to her chronic medications.  Patient was counseled to return with severe chest pain, especially if the pain is crushing or pressure-like and spreads to the arms, back, neck, or jaw, or if they have sweating, nausea, or shortness of breath with the pain. They were encouraged to call 911 with these symptoms.   The patient verbalized understanding and agreed.     MDM Rules/Calculators/A&P                          Pt with CP, syncope.  Awaiting CT imaging of the chest however no significant PE  obvious.  Blood pressure was 81/47 on arrival, however has mainly been in the low  100s to upper 90s systolic during ED stay.  Work-up is otherwise unrevealing.  Patient strongly encouraged to follow-up with PCP.  Plan for discharge home if she is able to ambulate and does well with orthostatics.   Final Clinical Impression(s) / ED Diagnoses Final diagnoses:  Precordial pain  Syncope, unspecified syncope type    Rx / DC Orders ED Discharge Orders    None       Renne Crigler, PA-C 05/21/20 1546    Geoffery Lyons, MD 05/22/20 2221

## 2020-05-21 NOTE — ED Triage Notes (Signed)
Pt here with chest pain, back pain, and anxiety x 3.5 weeks. Had syncopal episode on Sunday, endorses R hip soreness from the fall. Also sts she is out of her bipolar medication x several months. Today endorses central chest pain with radiation to L arm.

## 2020-05-21 NOTE — ED Provider Notes (Signed)
I assumed care of patient at shift change from previous team, please see their note for full H&P. Briefly patient is here for evaluation of chest pain.  She has been seen by Dr. Judd Lien.  The plan is to follow-up on CTA PE study, and urine.  Patient is not having dysuria, frequency, or urgency.   Physical Exam  BP 119/67   Pulse 72   Temp 97.8 F (36.6 C) (Oral)   Resp (!) 23   SpO2 100%   DG Chest 2 View  Result Date: 05/21/2020 CLINICAL DATA:  Chest pain EXAM: CHEST - 2 VIEW COMPARISON:  10/03/2019 FINDINGS: There is hyperinflation of the lungs compatible with COPD. Heart and mediastinal contours are within normal limits. No focal opacities or effusions. No acute bony abnormality. IMPRESSION: COPD.  No active disease. Electronically Signed   By: Charlett Nose M.D.   On: 05/21/2020 11:21   CT Angio Chest PE W and/or Wo Contrast  Result Date: 05/21/2020 CLINICAL DATA:  64 year old with positive D-dimer.  Chest pain. EXAM: CT ANGIOGRAPHY CHEST WITH CONTRAST TECHNIQUE: Multidetector CT imaging of the chest was performed using the standard protocol during bolus administration of intravenous contrast. Multiplanar CT image reconstructions and MIPs were obtained to evaluate the vascular anatomy. CONTRAST:  46mL OMNIPAQUE IOHEXOL 350 MG/ML SOLN COMPARISON:  10/03/2019 and 08/30/2017 and 02/01/2018 FINDINGS: Cardiovascular: Satisfactory opacification of the pulmonary arteries to the segmental level. No evidence of pulmonary embolism. Normal heart size. No pericardial effusion. Normal caliber of the thoracic aorta with atherosclerotic calcifications. Mediastinum/Nodes: No mediastinal or hilar lymphadenopathy. No axillary lymph node enlargement. Lungs/Pleura: Trachea and mainstem bronchi are patent. Mild paraseptal and centrilobular emphysema. Small nodules in the right upper lobe on sequence 6, images 45 and 46 has not significantly changed since 2019. The largest pulmonary nodule measures up to 4 mm in this  area. Stable nodule in the anterior right middle lobe measuring at least 7 mm on sequence 9, image 40. Poorly defined parenchymal densities in the superior segment of the right lower lobe on sequence 6, image 78 is unchanged since 2019. Stable focal thickening or nodularity along the left major fissure on sequence 6, image 63. No significant airspace disease or consolidation in lungs. No pleural effusions. Upper Abdomen: Unremarkable Musculoskeletal: Degenerative changes in lower cervical spine. Review of the MIP images confirms the above findings. IMPRESSION: 1. Negative for a pulmonary embolism. 2. No acute chest abnormality. 3. Aortic Atherosclerosis (ICD10-I70.0) and Emphysema (ICD10-J43.9). 4. Stable pulmonary nodules.  No significant change since 2019. Electronically Signed   By: Richarda Overlie M.D.   On: 05/21/2020 15:56   Labs Reviewed  BASIC METABOLIC PANEL - Abnormal; Notable for the following components:      Result Value   Glucose, Bld 100 (*)    Calcium 8.8 (*)    All other components within normal limits  CBC - Abnormal; Notable for the following components:   WBC 3.9 (*)    All other components within normal limits  D-DIMER, QUANTITATIVE (NOT AT [facility]) - Abnormal; Notable for the following components:   D-Dimer, Quant 0.66 (*)    All other components within normal limits  RAPID URINE DRUG SCREEN, HOSP PERFORMED - Abnormal; Notable for the following components:   Cocaine POSITIVE (*)    All other components within normal limits  URINALYSIS, ROUTINE W REFLEX MICROSCOPIC - Abnormal; Notable for the following components:   Specific Gravity, Urine >1.046 (*)    Leukocytes,Ua LARGE (*)    All other  components within normal limits  TROPONIN I (HIGH SENSITIVITY)  TROPONIN I (HIGH SENSITIVITY)     Chart review shows that when seen in April of this year her blood pressure was 92/51 so the lower BP along with her low weight appears to be her baseline.   CTA PE study does not show evidence  of PE or other cause for patient's symptoms.  She was noted by nursing staff to be taking her monitors off and walking in the hallway to the bathroom multiple times without any difficulty.  She was able to ambulate on pulse oximetry without being symptomatic or becoming hypoxic.  I discussed with patient that with her urine showing significantly elevated specific gravity she appears to be dehydrated.  We discussed that soda and milk are not the best for rehydration and she should drink water.  When I mentioned her cocaine on her UDS she states that when she started having pain in her chest "the boys" brought her cocaine to try and help treat her chest pain and she smoked that.  We discussed that cocaine is not a recommended treatment for chest pain and can cause and exacerbate many cardiac conditions.     Return precautions were discussed with patient who states their understanding.  At the time of discharge patient denied any unaddressed complaints or concerns.  Patient is agreeable for discharge home.  Note: Portions of this report may have been transcribed using voice recognition software. Every effort was made to ensure accuracy; however, inadvertent computerized transcription errors may be present        Cristina Gong, PA-C 05/21/20 1843    Charlynne Pander, MD 05/21/20 2234

## 2020-07-23 ENCOUNTER — Encounter (HOSPITAL_COMMUNITY): Payer: Self-pay | Admitting: Registered Nurse

## 2020-07-23 ENCOUNTER — Ambulatory Visit (HOSPITAL_COMMUNITY)
Admission: EM | Admit: 2020-07-23 | Discharge: 2020-07-23 | Disposition: A | Discharge status: Home / Self Care | Payer: Medicare Other | Attending: Registered Nurse | Admitting: Registered Nurse

## 2020-07-23 ENCOUNTER — Other Ambulatory Visit: Payer: Self-pay

## 2020-07-23 DIAGNOSIS — Z9151 Personal history of suicidal behavior: Secondary | ICD-10-CM | POA: Insufficient documentation

## 2020-07-23 DIAGNOSIS — R45851 Suicidal ideations: Secondary | ICD-10-CM | POA: Diagnosis present

## 2020-07-23 DIAGNOSIS — Z9114 Patient's other noncompliance with medication regimen: Secondary | ICD-10-CM | POA: Diagnosis not present

## 2020-07-23 DIAGNOSIS — F1414 Cocaine abuse with cocaine-induced mood disorder: Secondary | ICD-10-CM | POA: Diagnosis not present

## 2020-07-23 DIAGNOSIS — F1420 Cocaine dependence, uncomplicated: Secondary | ICD-10-CM | POA: Diagnosis present

## 2020-07-23 DIAGNOSIS — F1424 Cocaine dependence with cocaine-induced mood disorder: Secondary | ICD-10-CM | POA: Diagnosis not present

## 2020-07-23 DIAGNOSIS — F250 Schizoaffective disorder, bipolar type: Secondary | ICD-10-CM | POA: Diagnosis not present
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
## 2020-07-23 NOTE — BH Assessment (Signed)
Comprehensive Clinical Assessment (CCA) Note  07/23/2020 Terry Potter [id number]   Patient is a 65 year old female with a history of Schizoaffective Disorder, bipolar type and Cocaine Use Disorder, moderate who presents voluntarily to [facility] for assessment.  Patient reports she isn't doing well and needs treatment.  She admits to being off of psychotropic medications for over 6 months and she is hoping to get back on medications.  Patient endorsed SI and HI with plans on arrival, however she recants and has no identified plans.  She makes vague statements of things being "too much" and she states, "If someone sets me off I might want to hurt them.  Also, I have thoughts about people that hurt me in the past."  She denies having an identified victim at this point.  She also denies a specific plan to harm herself.  She admits to past attempts, "too many to count" with most recent being an attempt to overdose on cocaine and alcohol two weeks ago.  She states if she could start treatment and start medications again, she would feel safe.  Patient states she lives alone in a boarding house, however feels her landlord is very supportive; as is her daughter.  She identifies protective factors as seeing her 4 grandchildren.  Patient requests referral information for CD-IOP programs.    Disposition: Per Assunta Found, NP patient does not meet criteria for inpatient treatment.  CD-IOP is recommended.  Patient would typically be referred to [facility] CD-IOP, however she has no transportation and no way to set up MCD transportation with no phone service at this time. Patient asked to be considered for starting CD-IOP here at [facility].  Everlene Balls has agreed to accept pt to [facility] CD-IOP.    Chief Complaint: No chief complaint on file.  Visit Diagnosis: Schizoaffective Disorder, Bipolar Type   CCA Screening, Triage and Referral (STR)  Patient Reported Information How did you hear about [place]?  Other (Comment) (Phreesia 07/23/2020)  Referral name: Willis Modena [facility] 07/23/2020)  Referral phone number: No data recorded  Whom do you see for routine medical problems? Hospital ER (Phreesia 07/23/2020)  Practice/Facility Name: [facility] 07/23/2020)  Practice/Facility Phone Number: No data recorded Name of Contact: No data recorded Contact Number: No data recorded Contact Fax Number: No data recorded Prescriber Name: NA (Phreesia 07/23/2020)  Prescriber Address (if known): NA (Phreesia 07/23/2020)   What Is the Reason for Your Visit/Call Today? Bipolar  (Phreesia 07/23/2020)  How Long Has This Been Causing You Problems? 1-6 months (Phreesia 07/23/2020)  What Do You Feel Would Help You the Most Today? Other (Comment) (Phreesia 07/23/2020)   Have You Recently Been in Any Inpatient Treatment (Hospital/Detox/Crisis Center/28-Day Program)? No (Phreesia 07/23/2020)  Name/Location of Program/Hospital:No data recorded How Long Were You There? No data recorded When Were You Discharged? No data recorded  Have You Ever Received Services From [facility] Before? Yes (Phreesia 07/23/2020)  Who Do You See at [facility]? Na (Phreesia 07/23/2020)   Have You Recently Had Any Thoughts About Hurting Yourself? Yes (Phreesia 07/23/2020)  Are You Planning to Commit Suicide/Harm Yourself At This time? Yes (Phreesia 07/23/2020)   Have you Recently Had Thoughts About Hurting Someone Karolee Ohs? Yes (Phreesia 07/23/2020)  Explanation: Im Hurt Angry Upset Scared (Phreesia 07/23/2020)   Have You Used Any Alcohol or Drugs in the Past 24 Hours? Yes (Phreesia 07/23/2020)  How Long Ago Did You Use Drugs or Alcohol? No data recorded What Did You  Use and How Much? Beer And Coke (Phreesia 07/23/2020)   Do You Currently Have a Therapist/Psychiatrist? No (Phreesia 07/23/2020)  Name of Therapist/Psychiatrist: No data recorded  Have You Been Recently Discharged From Any Office  Practice or Programs? No (Phreesia 07/23/2020)  Explanation of Discharge From Practice/Program: No data recorded    CCA Screening Triage Referral Assessment Type of Contact: Face-to-Face  Is this Initial or Reassessment? No data recorded Date Telepsych consult ordered in [facility]:  No data recorded Time Telepsych consult ordered in [facility]:  No data recorded  Patient Reported Information Reviewed? Yes  Patient Left Without Being Seen? No data recorded Reason for Not Completing Assessment: No data recorded  Collateral Involvement: N/A   Does Patient Have a Court Appointed Legal Guardian? No data recorded Name and Contact of Legal Guardian: No data recorded If Minor and Not Living with Parent(s), Who has Custody? No data recorded Is CPS involved or ever been involved? Never  Is APS involved or ever been involved? Never   Patient Determined To Be At Risk for Harm To Self or Others Based on Review of Patient Reported Information or Presenting Complaint? No  Method: No data recorded Availability of Means: No data recorded Intent: No data recorded Notification Required: No data recorded Additional Information for Danger to Others Potential: No data recorded Additional Comments for Danger to Others Potential: No data recorded Are There Guns or Other Weapons in Your Home? No data recorded Types of Guns/Weapons: No data recorded Are These Weapons Safely Secured?                            No data recorded Who Could Verify You Are Able To Have These Secured: No data recorded Do You Have any Outstanding Charges, Pending Court Dates, Parole/Probation? No data recorded Contacted To Inform of Risk of Harm To Self or Others: No data recorded  Location of Assessment: [facility] Assessment Services   Does Patient Present under Involuntary Commitment? No  IVC Papers Initial File Date: No data recorded  [place] of Residence: [place]   Patient Currently Receiving the Following Services: Not  Receiving Services   Determination of Need: Routine (7 days)   Options For Referral: Medication Management; Intensive Outpatient Therapy     CCA Biopsychosocial Intake/Chief Complaint:  Patient presents reporting worsening depression with fleeting SI and HI, feeling irritable often.  She is diagnosed Schizoaffective, bipolar type and has not been on medications for over 6 months.  Current Symptoms/Problems: Patient states she used cocaine last night and she states she wants help with getting back on her medications and off of substances.   Patient Reported Schizophrenia/Schizoaffective Diagnosis in Past: Yes   Strengths: Motivated towards treatment  Preferences: CD-IOP  Abilities: No data recorded  Type of Services Patient Feels are Needed: CD-IOP preferred   Initial Clinical Notes/Concerns: No data recorded  Mental Health Symptoms Depression:  Hopelessness; Irritability   Duration of Depressive symptoms: Greater than two weeks   Mania:  Irritability; Racing thoughts   Anxiety:   Sleep; Tension; Worrying   Psychosis:  Hallucinations   Duration of Psychotic symptoms: Less than six months   Trauma:  None   Obsessions:  None   Compulsions:  None   Inattention:  N/A   Hyperactivity/Impulsivity:  N/A   Oppositional/Defiant Behaviors:  N/A   Emotional Irregularity:  Chronic feelings of emptiness; Mood lability   Other Mood/Personality Symptoms:  No data recorded   Mental Status  Exam Appearance and self-care  Stature:  Average   Weight:  Thin   Clothing:  Neat/clean   Grooming:  Normal   Cosmetic use:  Age appropriate   Posture/gait:  Tense   Motor activity:  Restless   Sensorium  Attention:  Normal   Concentration:  Normal; Anxiety interferes   Orientation:  Object; Person; Place; Situation; Time; X5   Recall/memory:  Normal   Affect and Mood  Affect:  Anxious   Mood:  Depressed; Irritable   Relating  Eye contact:  Normal   Facial  expression:  Anxious; Depressed; Responsive   Attitude toward examiner:  Cooperative   Thought and Language  Speech flow: Clear and Coherent   Thought content:  Appropriate to Mood and Circumstances   Preoccupation:  None   Hallucinations:  Auditory   Organization:  No data recorded  [employer] of Knowledge:  Average   Intelligence:  Average   Abstraction:  Normal   Judgement:  Fair   Dance movement psychotherapist:  Adequate   Insight:  Fair   Decision Making:  Normal   Social Functioning  Social Maturity:  Responsible   Social Judgement:  Naive   Stress  Stressors:  Relationship   Coping Ability:  Deficient supports   Skill Deficits:  Self-control   Supports:  Friends/Service system; Family     Religion: Religion/Spirituality Are You A Religious Person?: No  Leisure/Recreation: Leisure / Recreation Do You Have Hobbies?: Yes Leisure and Hobbies: spending time with 4 grandchildren  Exercise/Diet: Exercise/Diet Do You Exercise?: No Have You Gained or Lost A Significant Amount of Weight in the Past Six Months?: No Do You Follow a Special Diet?: No Do You Have Any Trouble Sleeping?: Yes Explanation of Sleeping Difficulties: fearful of noises some nights and this keeps her awake.   CCA Employment/Education Employment/Work Situation: Employment / Work Situation Employment situation: On disability Why is patient on disability: mental health concerns How long has patient been on disability: "years" What is the longest time patient has a held a job?: N/A Where was the patient employed at that time?: N/A Has patient ever been in the [employer]?: No  Education: Education Is Patient Currently Attending School?: No Last Grade Completed:  (UTA) Did Garment/textile technologist From [employer]?:  (UTA) Did You Have An Individualized Education Program (IIEP): No Did You Have Any Difficulty At School?: No Patient's Education Has Been Impacted by Current Illness:  No   CCA Family/Childhood History Family and Relationship History: Family history Marital status: Single What is your sexual orientation?: UTA Has your sexual activity been affected by drugs, alcohol, medication, or emotional stress?: UTA Does patient have children?: Yes How many children?: 1 How is patient's relationship with their children?: Good relationship with daughter.  Childhood History:  Childhood History By whom was/is the patient raised?:  (UTA) Additional childhood history information: UTA Description of patient's relationship with caregiver when they were a child: UTA Patient's description of current relationship with people who raised him/her: UTA How were you disciplined when you got in trouble as a child/adolescent?: UTA Does patient have siblings?:  (UTA) Did patient suffer any verbal/emotional/physical/sexual abuse as a child?: No Did patient suffer from severe childhood neglect?: No Has patient ever been sexually abused/assaulted/raped as an adolescent or adult?: No Was the patient ever a victim of a crime or a disaster?: No Witnessed domestic violence?: No Has patient been affected by domestic violence as an adult?: No  Child/Adolescent Assessment:     CCA  Substance Use Alcohol/Drug Use: Alcohol / Drug Use Pain Medications: See MAR Prescriptions: See MAR Over the Counter: See MAR History of alcohol / drug use?: Yes Longest period of sobriety (when/how long): 3 yrs clean - recent relapse 6 mos ago Negative Consequences of Use: Personal relationships,Financial Withdrawal Symptoms: Irritability Substance #1 Name of Substance 1: Cocaine 1 - Age of First Use: Unknown 1 - Amount (size/oz): varies, sometimes binges 1 - Frequency: 1-2 times per week 1 - Duration: 6 months - tjhis episode 1 - Last Use / Amount: last night - amt unknown 1 - Method of Aquiring: UTA 1- Route of Use: smokes                       ASAM's:  Six Dimensions of  Multidimensional Assessment  Dimension 1:  Acute Intoxication and/or Withdrawal Potential:   Dimension 1:  Description of individual's past and current experiences of substance use and withdrawal: N/A - cocaine use  Dimension 2:  Biomedical Conditions and Complications:   Dimension 2:  Description of patient's biomedical conditions and  complications: limited due to age and some weakness  Dimension 3:  Emotional, Behavioral, or Cognitive Conditions and Complications:  Dimension 3:  Description of emotional, behavioral, or cognitive conditions and complications: underlying diagnosis of Schizoaffective Disorder, bipolar type - pt admits to self-medicating  Dimension 4:  Readiness to Change:  Dimension 4:  Description of Readiness to Change criteria: Open to CD-IOP treatment  Dimension 5:  Relapse, Continued use, or Continued Problem Potential:  Dimension 5:  Relapse, continued use, or continued problem potential critiera description: Patient has done well with sobriety at points she has been stable on medications  Dimension 6:  Recovery/Living Environment:  Dimension 6:  Recovery/Iiving environment criteria description: Some support from daughter  ASAM Severity Score: ASAM's Severity Rating Score: 7  ASAM Recommended Level of Treatment: ASAM Recommended Level of Treatment: Level II Intensive Outpatient Treatment   Substance use Disorder (SUD) Substance Use Disorder (SUD)  Checklist Symptoms of Substance Use: Continued use despite having a persistent/recurrent physical/psychological problem caused/exacerbated by use,Continued use despite persistent or recurrent social, interpersonal problems, caused or exacerbated by use  Recommendations for Services/Supports/Treatments: Recommendations for Services/Supports/Treatments Recommendations For Services/Supports/Treatments: CD-IOP Intensive Chemical Dependency Program  DSM5 Diagnoses: Patient Active Problem List   Diagnosis Date Noted  . Cocaine use  disorder, moderate, in controlled environment (HCC) 07/23/2020  . Cocaine abuse with cocaine-induced mood disorder (HCC) 07/23/2020  . Bacterial vaginosis 02/01/2018  . Cocaine abuse (HCC) 02/01/2018  . Pulmonary nodules 02/01/2018  . Syncope 02/01/2018  . Hip fracture, unspecified laterality, closed, initial encounter (HCC) 01/01/2017  . Hip fracture (HCC) 01/01/2017  . Displaced fracture of left femoral neck (HCC) 01/01/2017  . Schizoaffective disorder, bipolar type (HCC) 03/19/2015    Class: Chronic    Patient Centered Plan: Patient is on the following Treatment Plan(s):  Substance Abuse   Referrals to Alternative Service(s): Patient has been accepted to CD-IOP program at [facility] Appt info included in the AVS.   Yetta Glassman, [facility]

## 2020-07-23 NOTE — Progress Notes (Signed)
Terry Potter received her AVS, questions were answered, personal belongings retrieved and escorted to the lobby.

## 2020-07-30 ENCOUNTER — Telehealth (HOSPITAL_COMMUNITY): Payer: Self-pay | Admitting: General Practice

## 2020-07-30 NOTE — Telephone Encounter (Signed)
Care Management - Follow Up [facility] Discharges   Writer attempted to make contact with patient today and was unsuccessful.  Writer was able to leave a HIPPA compliant voice message and will await callback.   

## 2020-07-31 ENCOUNTER — Other Ambulatory Visit: Payer: Self-pay

## 2020-07-31 ENCOUNTER — Ambulatory Visit (INDEPENDENT_AMBULATORY_CARE_PROVIDER_SITE_OTHER): Payer: Medicare Other | Admitting: Behavioral Health

## 2020-07-31 DIAGNOSIS — F1414 Cocaine abuse with cocaine-induced mood disorder: Secondary | ICD-10-CM

## 2020-08-07 NOTE — Progress Notes (Signed)
Terry Potter is a 65 y.o. female client who presents to [facility] outpatient for a scheduled SAIOP intake appointment with this Clinical research associate. [name] self-reported that she smoked crack cocaine "last night". [name] was alert and oriented x4, while pleasant in her mood. [name] was often hyper-religious in her speech. She shared her current triggers are "being around the wrong people and my living environment". [name] currently lives alone in a boarding house. [name] has a limited support system. [name] reports she currently does not feel comfortable engaging in group therapy at this time and would like to start with individual therapy. [name] was scheduled to f/u with this writer 2 weeks from today's appointment.   Therapist offered encouragement and support.       Terry Potter, Counselor

## 2020-08-12 ENCOUNTER — Telehealth (HOSPITAL_COMMUNITY): Payer: Self-pay | Admitting: Behavioral Health

## 2020-08-13 ENCOUNTER — Encounter (HOSPITAL_COMMUNITY): Payer: Self-pay | Admitting: Psychiatry

## 2020-08-13 ENCOUNTER — Other Ambulatory Visit: Payer: Self-pay

## 2020-08-13 ENCOUNTER — Ambulatory Visit (INDEPENDENT_AMBULATORY_CARE_PROVIDER_SITE_OTHER): Payer: Medicare Other | Admitting: Psychiatry

## 2020-08-13 VITALS — BP 121/65 | HR 75 | Ht 64.0 in | Wt 95.8 lb

## 2020-08-13 DIAGNOSIS — F250 Schizoaffective disorder, bipolar type: Secondary | ICD-10-CM

## 2020-08-13 DIAGNOSIS — F4310 Post-traumatic stress disorder, unspecified: Secondary | ICD-10-CM

## 2020-08-13 MED ORDER — PRAZOSIN HCL 2 MG PO CAPS: 2.0000 mg | ORAL_CAPSULE | Freq: Every day | ORAL | 2 refills | Status: DC

## 2020-08-13 MED ORDER — RISPERIDONE 1 MG PO TABS: 1.0000 mg | ORAL_TABLET | Freq: Two times a day (BID) | ORAL | 2 refills | Status: DC

## 2020-08-13 MED ORDER — OXCARBAZEPINE 300 MG PO TABS: 300.0000 mg | ORAL_TABLET | Freq: Three times a day (TID) | ORAL | 2 refills | Status: DC

## 2020-08-13 MED ORDER — GABAPENTIN 300 MG PO CAPS: 300.0000 mg | ORAL_CAPSULE | Freq: Three times a day (TID) | ORAL | 2 refills | Status: DC

## 2020-08-13 MED ORDER — MIRTAZAPINE 15 MG PO TABS: 15.0000 mg | ORAL_TABLET | Freq: Every day | ORAL | 2 refills | Status: DC
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
## 2020-08-14 ENCOUNTER — Ambulatory Visit (INDEPENDENT_AMBULATORY_CARE_PROVIDER_SITE_OTHER): Payer: Medicare Other | Admitting: Behavioral Health

## 2020-08-14 DIAGNOSIS — F1420 Cocaine dependence, uncomplicated: Secondary | ICD-10-CM
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
## 2020-08-28 ENCOUNTER — Ambulatory Visit (INDEPENDENT_AMBULATORY_CARE_PROVIDER_SITE_OTHER): Payer: Medicare Other | Admitting: Behavioral Health

## 2020-08-28 ENCOUNTER — Other Ambulatory Visit: Payer: Self-pay

## 2020-08-28 DIAGNOSIS — F1414 Cocaine abuse with cocaine-induced mood disorder: Secondary | ICD-10-CM

## 2020-09-01 NOTE — Progress Notes (Signed)
   THERAPIST PROGRESS NOTE  Session Time: 3:00PM  Participation Level: Active  Behavioral Response: BizarreLethargicDepressed  Type of Therapy: Individual Therapy  Treatment Goals addressed: Diagnosis: Cocaine Use Disorder  Interventions: Motivational Interviewing  Summary: Terry Potter is a 65 y.o. female who presents to [facility] for a scheduled individual therapy session. [name] appeared to be lethargic and inattentive during session. She reports she has been under a lot of stress since she's been living with her mother "since Saturday". She reports her 91yo mother recently had a fall and she has taken on the responsibility to be her mother's caretaker. [name] rated her stress level at a "10" on a scale 0 to 10 with 10 being the highest. [name] reports recent substance use as she reports her increased stress I she trigger.   Suicidal/Homicidal: No  Therapist Response: Therapist offered encouragement and support. Therapist used motivation interviewing techniques to address client's ambivalence.   Plan: Return again in 2 weeks.  Diagnosis: Axis I: Substance Abuse    Axis II: No diagnosis    Mamie Nick, Counselor 09/01/2020

## 2020-09-11 ENCOUNTER — Ambulatory Visit (HOSPITAL_COMMUNITY): Payer: Medicare Other | Admitting: Behavioral Health

## 2020-09-11 ENCOUNTER — Other Ambulatory Visit: Payer: Self-pay

## 2020-09-25 ENCOUNTER — Ambulatory Visit (HOSPITAL_COMMUNITY): Payer: Medicare Other | Admitting: Behavioral Health

## 2020-09-30 ENCOUNTER — Ambulatory Visit (HOSPITAL_COMMUNITY): Payer: Medicare Other | Admitting: Behavioral Health

## 2020-10-08 ENCOUNTER — Ambulatory Visit (HOSPITAL_COMMUNITY): Payer: Medicare Other | Admitting: Behavioral Health

## 2020-10-08 ENCOUNTER — Other Ambulatory Visit: Payer: Self-pay

## 2020-10-08 NOTE — Progress Notes (Unsigned)
   THERAPIST PROGRESS NOTE  Session Time: ***  Participation Level: {BHH PARTICIPATION LEVEL:22264}  Behavioral Response: {Appearance:22683}{BHH LEVEL OF CONSCIOUSNESS:22305}{BHH MOOD:22306}  Type of Therapy: {CHL AMB BH Type of Therapy:21022741}  Treatment Goals addressed: {CHL AMB BH Treatment Goals Addressed:21022754}  Interventions: {CHL AMB BH Type of Intervention:21022753}  Summary: Terry Potter is a 65 y.o. female who presents with ***.   Suicidal/Homicidal: {BHH YES OR NO:22294}{yes/no/with/without intent/plan:22693}  Therapist Response: ***  Plan: Return again in *** weeks.  Diagnosis: Axis I: {psych axis 1:31909}    Axis II: {psych axis 2:31910}    Mamie Nick, Counselor 10/08/2020

## 2020-10-23 ENCOUNTER — Encounter (HOSPITAL_COMMUNITY): Payer: Self-pay | Admitting: Psychiatry

## 2020-10-23 ENCOUNTER — Other Ambulatory Visit: Payer: Self-pay

## 2020-10-23 ENCOUNTER — Telehealth (INDEPENDENT_AMBULATORY_CARE_PROVIDER_SITE_OTHER): Payer: Medicare Other | Admitting: Psychiatry

## 2020-10-23 ENCOUNTER — Ambulatory Visit (INDEPENDENT_AMBULATORY_CARE_PROVIDER_SITE_OTHER): Payer: Medicare Other | Admitting: Behavioral Health

## 2020-10-23 DIAGNOSIS — F4310 Post-traumatic stress disorder, unspecified: Secondary | ICD-10-CM

## 2020-10-23 DIAGNOSIS — F250 Schizoaffective disorder, bipolar type: Secondary | ICD-10-CM

## 2020-10-23 DIAGNOSIS — F1410 Cocaine abuse, uncomplicated: Secondary | ICD-10-CM

## 2020-10-23 MED ORDER — GABAPENTIN 300 MG PO CAPS: 300.0000 mg | ORAL_CAPSULE | Freq: Three times a day (TID) | ORAL | 2 refills | Status: DC

## 2020-10-23 MED ORDER — PRAZOSIN HCL 2 MG PO CAPS: 2.0000 mg | ORAL_CAPSULE | Freq: Every day | ORAL | 2 refills | Status: DC

## 2020-10-23 MED ORDER — MIRTAZAPINE 15 MG PO TABS: 15.0000 mg | ORAL_TABLET | Freq: Every day | ORAL | 2 refills | Status: DC

## 2020-10-23 MED ORDER — RISPERIDONE 1 MG PO TABS: 1.0000 mg | ORAL_TABLET | Freq: Two times a day (BID) | ORAL | 2 refills | Status: DC

## 2020-10-23 MED ORDER — OXCARBAZEPINE 300 MG PO TABS: 300.0000 mg | ORAL_TABLET | Freq: Three times a day (TID) | ORAL | 2 refills | Status: DC
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
## 2020-10-23 NOTE — Progress Notes (Signed)
   THERAPIST PROGRESS NOTE  Session Time: 35 minutes  Participation Level: Active  Behavioral Response: BizarreAlertPleasant  Type of Therapy: Individual Therapy  Treatment Goals addressed: Coping and Diagnosis: Cocaine Use Disorder, severe  Interventions: Motivational Interviewing  Summary: Terry Potter is a 65 y.o. female who presents to [facility] for a scheduled individual therapy session. [name] shared that she is no longer worried about her mother because she in the hospital and will be transferred to an assisted living facility. [name] shared that she has been trying to stay busy and rid herself of free time by cleaning and organizing her apartment. [name] reported "my neighbors bought me some VSOP (alcohol), a chicken plate, and a white dress". She then reported that last night an elder friend came over and and they drank this VSOP together. When asked by this writer, of any drug use since our last session, clt reported "yes". She states "it will be nasty dirty (referring to her urine drug screen)". [name] shared that she would be open to the idea of going into detox treatment. [name] was provided with the contact number to [facility] - [place], [place]. At this time, due to continue use of crack cocaine, clt is being recommended for a higher level of care (residential/detox). [name] expressed her understanding and agreeable to this plan.  Suicidal/Homicidal: No  Therapist Response: Therapist offered encouragement and support. Therapist discussed with clt her goal of establishing a healthy support system, by evaluating her current relationships with her family. Therapist discussed the option of detox/higher level of care.   Plan: [name] is being recommended for a higher level of care due to continued substance use.  Diagnosis: Axis I: Substance Abuse    Axis II: No diagnosis    Mamie Nick, Counselor 10/23/2020

## 2020-10-23 NOTE — Progress Notes (Signed)
BH MD/PA/NP OP Progress Note Virtual Visit via Telephone Note  I connected with Terry Potter on 10/23/20 at  1:00 PM EDT by telephone and verified that I am speaking with the correct person using two identifiers.  Location: Patient: home Provider: Clinic   I discussed the limitations, risks, security and privacy concerns of performing an evaluation and management service by telephone and the availability of in person appointments. I also discussed with the patient that there may be a patient responsible charge related to this service. The patient expressed understanding and agreed to proceed.   I provided 30 minutes of non-face-to-face time during this encounter.   10/23/2020 2:21 PM Terry Potter  MRN:  [id number]  Chief Complaint: "I feel so much better since starting the medications"  HPI: 65 year old female seen today for follow up psychiatric evaluation. She has a psychiatric history of Cocaine abuses, schizoaffective bipolar type, SA. She is currently managed on gabapentin 300 mg three time daily, mirtazapine 15 mg nightly, Trileptal 300 mg three times daily, Prazosin 2 mg nightly, and Risperdal 1 mg two timed daily. Today she notes her medications are effective in managing her psychiatric conditions.  Today patient was unable to login virtually so her exam was done over the phone. During exam she was pleasant, cooperative, and engaged in conversation. She notes that since restarting her medications she has felt mentally stable. She notes that she has minimal anxiety and depression. Provider conducted a GAD 7 and patient scored a 6, at her last visit she scored a 21. Patient noted at times she worries about her mother who is hospitalized. Provider also conducted a PHQ 9 and patient scored a 0, at her last visit she scored a 23. She endorses adequate sleep and appetite. Today she denies SI/HI/VAH, mania, or paranoia.  No medication changes made today. Patient agreeable to  continue all medications as prescribed. She will follow up with outpatient counseling for therapy. No other concerns noted at this time.  Visit Diagnosis:    ICD-10-CM   1. Schizoaffective disorder, bipolar type (HCC)  F25.0 gabapentin (NEURONTIN) 300 MG capsule    mirtazapine (REMERON) 15 MG tablet    Oxcarbazepine (TRILEPTAL) 300 MG tablet    prazosin (MINIPRESS) 2 MG capsule    risperiDONE (RISPERDAL) 1 MG tablet  2. PTSD (post-traumatic stress disorder)  F43.10 prazosin (MINIPRESS) 2 MG capsule    Past Psychiatric History: Cocaine abuses, schizoaffective bipolar type, SA  Past Medical History:  Past Medical History:  Diagnosis Date  . Arthritis   . Bipolar disorder (HCC)   . Ectopic pregnancy     Past Surgical History:  Procedure Laterality Date  . ABDOMINAL SURGERY    . 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 TUMOR REMOVAL     Uterine tumor removed    Family Psychiatric History: Notes that daughter has mental health issues but does not know which one, Mother anxiety and father bipolar disorder and anxiety  Family History:  Family History  Problem Relation Age of Onset  . Cancer Mother     Social History:  Social History   Socioeconomic History  . Marital status: Legally Separated    Spouse name: Not on file  . Number of children: Not on file  . Years of education: Not on file  . Highest education level: Not on file  Occupational History  . Not on file  Tobacco Use  . Smoking status: Current Every Day Smoker    Packs/day: 0.25    Types: Cigarettes, Cigars  . Smokeless tobacco: Never Used  Substance and Sexual Activity  . Alcohol use: Yes    Comment: occ  . Drug use: Yes    Types: Cocaine    Comment: occ  . Sexual activity: Not on file  Other Topics Concern  . Not on file  Social History Narrative  . Not on  file   Social Determinants of Health   Financial Resource Strain: Not on file  Food Insecurity: Not on file  Transportation Needs: Not on file  Physical Activity: Not on file  Stress: Not on file  Social Connections: Not on file    Allergies:  Allergies  Allergen Reactions  . Tetracyclines & Related Hives    Metabolic Disorder Labs: Lab Results  Component Value Date   HGBA1C 5.0 01/02/2017   MPG 97 01/02/2017   No results found for: PROLACTIN No results found for: CHOL, TRIG, HDL, CHOLHDL, VLDL, LDLCALC No results found for: TSH  Therapeutic Level Labs: No results found for: LITHIUM No results found for: VALPROATE No components found for:  CBMZ  Current Medications: Current Outpatient Medications  Medication Sig Dispense Refill  . albuterol (PROAIR HFA) 108 (90 Base) MCG/ACT inhaler Inhale 2 puffs into the lungs every 6 (six) hours as needed for wheezing or shortness of breath. 6.7 g 0  . aspirin EC 325 MG EC tablet Take 1 tablet (325 mg total) by mouth 2 (two) times daily with a meal. 30 tablet 0  . budesonide-formoterol (SYMBICORT) 160-4.5 MCG/ACT inhaler Inhale 2 puffs into the lungs 2 (two) times daily.    . fluticasone (FLONASE) 50 MCG/ACT nasal spray Place 1 spray into both nostrils daily as needed for rhinitis.    Marland Kitchen gabapentin (NEURONTIN) 300 MG capsule Take 1 capsule (300 mg total) by mouth 3 (three) times daily. 90 capsule 2  . mirtazapine (REMERON) 15 MG tablet Take 1 tablet (15 mg total) by mouth at bedtime. 30 tablet 2  . naproxen (NAPROSYN) 500 MG tablet Take 500 mg by mouth 2 (two) times daily as needed for mild pain.    . Oxcarbazepine (TRILEPTAL) 300 MG tablet Take 1 tablet (300 mg total) by mouth 3 (three) times daily. 90 tablet 2  . prazosin (MINIPRESS) 2 MG capsule Take 1 capsule (2 mg total) by mouth at bedtime. 30 capsule 2  . risperiDONE (RISPERDAL) 1 MG tablet Take 1 tablet (1 mg total) by mouth 2 (two) times daily. 30 tablet 2   No current  facility-administered medications for this visit.     Musculoskeletal: Strength & Muscle Tone: Unable to assess due to telephone visit Gait & Station: Unable to assess due to telephone visit Patient leans: N/A  Psychiatric Specialty Exam: Review of Systems  There were no vitals taken for this visit.There is no height or weight on file to calculate BMI.  General Appearance: Unable to assess due to telephone visit  Eye Contact:  Unable to assess due to telephone visit  Speech:  Clear and Coherent and Normal Rate  Volume:  Normal  Mood:  Euthymic  Affect:  Appropriate and Congruent  Thought Process:  Coherent, Goal Directed and Linear  Orientation:  Full (Time, Place, and Person)  Thought Content: WDL and Logical   Suicidal Thoughts:  No  Homicidal Thoughts:  No  Memory:  Immediate;   Good  Recent;   Good Remote;   Good  Judgement:  Good  Insight:  Good  Psychomotor Activity:  Normal  Concentration:  Concentration: Good and Attention Span: Good  Recall:  Good  Fund of Knowledge: Good  Language: Good  Akathisia:  No  Handed:  Right  AIMS (if indicated): Not done  Assets:  Communication Skills Desire for Improvement Financial Resources/Insurance Housing Leisure Time  ADL's:  Intact  Cognition: WNL  Sleep:  Good   Screenings: GAD-7   Flowsheet Row Video Visit from 10/23/2020 in [facility] Office Visit from 08/13/2020 in [facility]  Total GAD-7 Score 6 21    PHQ2-9   Flowsheet Row Video Visit from 10/23/2020 in [facility] Office Visit from 08/13/2020 in [facility] from 07/23/2020 in [facility]  PHQ-2 Total Score 0 6 6  PHQ-9 Total Score 0 23 22    Flowsheet Row Video Visit from 10/23/2020 in [facility] Office Visit from 08/13/2020 in [facility] ED from 07/23/2020 in  [facility]  C-SSRS RISK CATEGORY No Risk Error: Q3, 4, or 5 should not be populated when Q2 is No Low Risk       Assessment and Plan: Today patient note that she is doing well on her current medication regimen. No medication changes made today. Patient agreeable to continue all medications as prescribed.  1. Schizoaffective disorder, bipolar type (HCC)  Continue- gabapentin (NEURONTIN) 300 MG capsule; Take 1 capsule (300 mg total) by mouth 3 (three) times daily.  Dispense: 90 capsule; Refill: 2 Continue- mirtazapine (REMERON) 15 MG tablet; Take 1 tablet (15 mg total) by mouth at bedtime.  Dispense: 30 tablet; Refill: 2 Continue- Oxcarbazepine (TRILEPTAL) 300 MG tablet; Take 1 tablet (300 mg total) by mouth 3 (three) times daily.  Dispense: 90 tablet; Refill: 2 Continue- prazosin (MINIPRESS) 2 MG capsule; Take 1 capsule (2 mg total) by mouth at bedtime.  Dispense: 30 capsule; Refill: 2 Continue- risperiDONE (RISPERDAL) 1 MG tablet; Take 1 tablet (1 mg total) by mouth 2 (two) times daily.  Dispense: 30 tablet; Refill: 2  2. PTSD (post-traumatic stress disorder)  Continue- prazosin (MINIPRESS) 2 MG capsule; Take 1 capsule (2 mg total) by mouth at bedtime.  Dispense: 30 capsule; Refill: 2  Follow up in 3 months Follow up with therapy    Shanna Cisco, NP 10/23/2020, 2:21 PM

## 2020-12-05 ENCOUNTER — Other Ambulatory Visit: Payer: Self-pay

## 2020-12-05 ENCOUNTER — Emergency Department (HOSPITAL_COMMUNITY)
Admission: EM | Admit: 2020-12-05 | Discharge: 2020-12-05 | Discharge status: Against Medical Advice | Payer: Medicare Other | Attending: Emergency Medicine | Admitting: Emergency Medicine

## 2020-12-05 ENCOUNTER — Emergency Department (HOSPITAL_COMMUNITY): Payer: Medicare Other

## 2020-12-05 ENCOUNTER — Encounter (HOSPITAL_COMMUNITY): Payer: Self-pay | Admitting: Emergency Medicine

## 2020-12-05 DIAGNOSIS — R52 Pain, unspecified: Secondary | ICD-10-CM | POA: Insufficient documentation

## 2020-12-05 DIAGNOSIS — Z20822 Contact with and (suspected) exposure to covid-19: Secondary | ICD-10-CM | POA: Diagnosis not present

## 2020-12-05 DIAGNOSIS — Z5321 Procedure and treatment not carried out due to patient leaving prior to being seen by health care provider: Secondary | ICD-10-CM | POA: Insufficient documentation

## 2020-12-05 DIAGNOSIS — R059 Cough, unspecified: Secondary | ICD-10-CM | POA: Insufficient documentation

## 2020-12-05 LAB — URINALYSIS, ROUTINE W REFLEX MICROSCOPIC
Bilirubin Urine: NEGATIVE
Glucose, UA: NEGATIVE mg/dL
Hgb urine dipstick: NEGATIVE
Ketones, ur: NEGATIVE mg/dL
Leukocytes,Ua: NEGATIVE
Nitrite: NEGATIVE
Protein, ur: NEGATIVE mg/dL
Specific Gravity, Urine: 1.023 (ref 1.005–1.030)
pH: 5 (ref 5.0–8.0)

## 2020-12-05 NOTE — ED Provider Notes (Signed)
Emergency Medicine Provider Triage Evaluation Note  Terry Potter , a 65 y.o. female  was evaluated in triage.  Pt complains of cough.  Review of Systems  Positive: Body aches, cough, wound on L chest Negative: Fever, sob, cp, loss of taste or smell, n/v/d  Physical Exam  BP 110/67   Pulse 83   Temp 97.9 F (36.6 C) (Oral)   Resp 18   Ht 5\' 4"  (1.626 m)   SpO2 98%   BMI 16.44 kg/m  Gen:   Awake, no distress   Resp:  Normal effort  MSK:   Moves extremities without difficulty  Other:  L chest: an area of erythema and mild induration overlying an old wound, measuring approximately 1.5cm to L anterior chest, mildly tender. No fluctuance  Medical Decision Making  Medically screening exam initiated at 6:51 PM.  Appropriate orders placed.  Terry Potter was informed that the remainder of the evaluation will be completed by another provider, this initial triage assessment does not replace that evaluation, and the importance of remaining in the ED until their evaluation is complete.  Pt report aches and pain to body for several months, having a cough for the past few days, wanted to get checked out.  Also wants medication reconciliation, and to be set up with a PCP.  Have been covid vaccinated.  Denies SOB.   Terry Hahn, PA-C 12/05/20 1858    Terry Potter, 12/07/20, DO 12/05/20 2221

## 2020-12-05 NOTE — ED Triage Notes (Signed)
Pt states that she has had body aches x 2 months and a cough x 2 days. Wonders if her medications are not working properly. Alert and oriented.

## 2020-12-05 NOTE — ED Notes (Signed)
Pt called multiple times, attempted to locate pt. Pt not in waiting room.

## 2020-12-06 LAB — SARS CORONAVIRUS 2 (TAT 6-24 HRS): SARS Coronavirus 2: NEGATIVE

## 2021-01-23 ENCOUNTER — Other Ambulatory Visit: Payer: Self-pay

## 2021-01-23 ENCOUNTER — Encounter (HOSPITAL_COMMUNITY): Payer: Self-pay | Admitting: Psychiatry

## 2021-01-23 ENCOUNTER — Ambulatory Visit (INDEPENDENT_AMBULATORY_CARE_PROVIDER_SITE_OTHER): Payer: Medicare Other | Admitting: Psychiatry

## 2021-01-23 VITALS — BP 115/80 | HR 71 | Ht 64.0 in | Wt 107.0 lb

## 2021-01-23 DIAGNOSIS — F4321 Adjustment disorder with depressed mood: Secondary | ICD-10-CM | POA: Diagnosis not present

## 2021-01-23 DIAGNOSIS — F250 Schizoaffective disorder, bipolar type: Secondary | ICD-10-CM | POA: Diagnosis not present

## 2021-01-23 MED ORDER — RISPERIDONE 1 MG PO TABS: 1.0000 mg | ORAL_TABLET | Freq: Two times a day (BID) | ORAL | 3 refills | Status: DC

## 2021-01-23 MED ORDER — OXCARBAZEPINE 300 MG PO TABS: 300.0000 mg | ORAL_TABLET | Freq: Three times a day (TID) | ORAL | 3 refills | Status: DC

## 2021-01-23 MED ORDER — GABAPENTIN 300 MG PO CAPS: 300.0000 mg | ORAL_CAPSULE | Freq: Three times a day (TID) | ORAL | 3 refills | Status: DC

## 2021-01-23 MED ORDER — MIRTAZAPINE 15 MG PO TABS: 15.0000 mg | ORAL_TABLET | Freq: Every day | ORAL | 3 refills | Status: DC

## 2021-01-23 NOTE — Progress Notes (Signed)
BH MD/PA/NP OP Progress Note   01/23/2021 2:14 PM Terry Potter  MRN:  [id number]  Chief Complaint:  Chief Complaint   Medication Management   "It's been touch and go"  HPI: 65 year old female seen today for follow-up psychiatric evaluation. She has psychiatric history of cocaine dependence, schizoaffective disorder bipolar type and SA. She is currently managed on Gabapentin 300mg  TID, mirtazapine 15mg  nightly, Trileptal 300 TID, Prazosin 2mg  nightly, and Risperdal 1mg  BID. She notes her medications are effective in managing her psychiatric conditions. Patient states Prazosin was making her have nightmares, and she stopped taking this per recommendation of her PCP. She is having right hip pain 9/10 on pain scale.    Today, patient is well groomed, pleasant, cooperative, engaged in conversation, and maintaining eye contact. She informed provider that since her last visit, her mood has been touch and go. She lost her mom and stepfather as well as a few other family and friends recently. She tearfully states that what hurt her the most is that her daughter did not handle the cremation affairs well. Patient notes that she celebrated her 14th birthday early with friends recently and reports this was a very positive moment for her; she feels the people around her treat her specially in their own little ways and she will endure her grief from losing her mother.    Today, provider conducted a GAD-7 and patient scored a 11, at last visit scored a 6. Provider also conducted a PHQ-9 and patient scored a 14, at last visit scored a 0. She endorses sleeping irregular hours a day which patient endorses is better managed with her current medications. She endorses adequate appetite with slight weight gain. She denies SI, HI, VAH, mania, or paranoia.    Patient is agreeable to discontinuing Prazosin 2mg  and continuing Gabapentin 300mg  TID, mirtazapine 15mg  nightly, Trileptal 300 TID, and Risperdal 1mg  BID. She  declines the offer to meet with a therapist here at this time and understands she may set up a therapy appointment as needed at any time. No other concerns noted at this time.  Visit Diagnosis:    ICD-10-CM   1. Grief  F43.21     2. 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   Procedure: TOTAL HIP ARTHROPLASTY ANTERIOR APPROACH;  Surgeon: , MD;  Location: [facility] OR;  Service: Orthopedics;  Laterality: Left;   TUMOR REMOVAL     Uterine tumor removed    Family Psychiatric History: Notes that daughter has mental health issues but does not know which one, Mother anxiety and father bipolar disorder and anxiety  Family History:  Family History  Problem Relation Age of Onset   Cancer Mother     Social History:  Social History   Socioeconomic History   Marital status: Legally Separated    Spouse name: Not on file   Number of children: Not on file   Years of education: Not on file   Highest education  level: Not on file  Occupational History   Not on file  Tobacco Use   Smoking status: Every Day    Packs/day: 0.25    Types: Cigarettes, Cigars   Smokeless tobacco: Never  Substance and Sexual Activity   Alcohol use: Yes    Comment: occ   Drug use: Yes    Types: Cocaine    Comment: occ   Sexual activity: Not on file  Other Topics Concern   Not on file  Social History Narrative   Not on file   Social Determinants of Health   Financial Resource Strain: Not on file  Food  Insecurity: Not on file  Transportation Needs: Not on file  Physical Activity: Not on file  Stress: Not on file  Social Connections: Not on file    Allergies:  Allergies  Allergen Reactions   Tetracyclines & Related Hives    Metabolic Disorder Labs: Lab Results  Component Value Date   HGBA1C 5.0 01/02/2017   MPG 97 01/02/2017   No results found for: PROLACTIN No results found for: CHOL, TRIG, HDL, CHOLHDL, VLDL, LDLCALC No results found for: TSH  Therapeutic Level Labs: No results found for: LITHIUM No results found for: VALPROATE No components found for:  CBMZ  Current Medications: Current Outpatient Medications  Medication Sig Dispense Refill   albuterol (PROAIR HFA) 108 (90 Base) MCG/ACT inhaler Inhale 2 puffs into the lungs every 6 (six) hours as needed for wheezing or shortness of breath. 6.7 g 0   aspirin EC 325 MG EC tablet Take 1 tablet (325 mg total) by mouth 2 (two) times daily with a meal. 30 tablet 0   budesonide-formoterol (SYMBICORT) 160-4.5 MCG/ACT inhaler Inhale 2 puffs into the lungs 2 (two) times daily.     fluticasone (FLONASE) 50 MCG/ACT nasal spray Place 1 spray into both nostrils daily as needed for rhinitis.     gabapentin (NEURONTIN) 300 MG capsule Take 1 capsule (300 mg total) by mouth 3 (three) times daily. 90 capsule 3   mirtazapine (REMERON) 15 MG tablet Take 1 tablet (15 mg total) by mouth at bedtime. 30 tablet 3   naproxen (NAPROSYN) 500 MG tablet Take 500 mg by mouth 2 (two) times daily as needed for mild pain.     Oxcarbazepine (TRILEPTAL) 300 MG tablet Take 1 tablet (300 mg total) by mouth 3 (three) times daily. 90 tablet 3   risperiDONE (RISPERDAL) 1 MG tablet Take 1 tablet (1 mg total) by mouth 2 (two) times daily. 60 tablet 3   No current facility-administered medications for this visit.     Musculoskeletal: Strength & Muscle Tone: within normal limits Gait & Station: normal, Unable to assess due to telephone visit Patient leans:  N/A  Psychiatric Specialty Exam: Review of Systems  Blood pressure 115/80, pulse 71, height 5\' 4"  (1.626 m), weight 107 lb (48.5 kg), SpO2 100 %.Body mass index is 18.37 kg/m.  General Appearance: Well Groomed  Eye Contact:  Good  Speech:  Clear and Coherent and Normal Rate  Volume:  Normal  Mood:  Euthymic and notes that she has increased anxiety and depression due to recent loss of her mother and stepfather however she is able to cope with it  Affect:  Appropriate and Congruent  Thought Process:  Coherent, Goal Directed and Linear  Orientation:  Full (Time, Place, and Person)  Thought Content: WDL and Logical   Suicidal Thoughts:  No  Homicidal Thoughts:  No  Memory:  Immediate;   Good Recent;  Good Remote;   Good  Judgement:  Good  Insight:  Good  Psychomotor Activity:  Normal  Concentration:  Concentration: Good and Attention Span: Good  Recall:  Good  Fund of Knowledge: Good  Language: Good  Akathisia:  No  Handed:  Right  AIMS (if indicated): Not done  Assets:  Communication Skills Desire for Improvement Financial Resources/Insurance Housing Leisure Time  ADL's:  Intact  Cognition: WNL  Sleep:  Good   Screenings: GAD-7    Flowsheet Row Clinical Support from 01/23/2021 in [facility] Video Visit from 10/23/2020 in [facility] Office Visit from 08/13/2020 in [facility]  Total GAD-7 Score 11 6 21       PHQ2-9    Flowsheet Row Clinical Support from 01/23/2021 in [facility] Video Visit from 10/23/2020 in [facility] Office Visit from 08/13/2020 in [facility] ED from 07/23/2020 in [facility]  PHQ-2 Total Score 4 0 6 6  PHQ-9 Total Score 14 0 23 22      Flowsheet Row ED from 12/05/2020 in [facility] Video Visit from 10/23/2020 in  [facility] Office Visit from 08/13/2020 in [facility]  C-SSRS RISK CATEGORY No Risk No Risk Error: Q3, 4, or 5 should not be populated when Q2 is No        Assessment and Plan: Patient notes that she increase loss of her mother and stepfather.  She notes that her anxiety and depression has worsened since the loss however reports she is able to cope with it. Patient is agreeable to discontinuing Prazosin 2mg  she notes that it worsened her nightmares.  She will continue Gabapentin 300mg  TID, mirtazapine 15mg  nightly, Trileptal 300 TID, and Risperdal 1mg  BID. She declines the offer to meet with a therapist here at this time and understands she may set up a therapy appointment as needed at any time.  1. Schizoaffective disorder, bipolar type (HCC)  Continue- gabapentin (NEURONTIN) 300 MG capsule; Take 1 capsule (300 mg total) by mouth 3 (three) times daily.  Dispense: 90 capsule; Refill: 2 Continue- mirtazapine (REMERON) 15 MG tablet; Take 1 tablet (15 mg total) by mouth at bedtime.  Dispense: 30 tablet; Refill: 2 Continue- Oxcarbazepine (TRILEPTAL) 300 MG tablet; Take 1 tablet (300 mg total) by mouth 3 (three) times daily.  Dispense: 90 tablet; Refill: 2 Continue- prazosin (MINIPRESS) 2 MG capsule; Take 1 capsule (2 mg total) by mouth at bedtime.  Dispense: 30 capsule; Refill: 2 Continue- risperiDONE (RISPERDAL) 1 MG tablet; Take 1 tablet (1 mg total) by mouth 2 (two) times daily.  Dispense: 30 tablet; Refill: 2  2. Grief    Follow up in 3 months     Shanna Cisco, NP 01/23/2021, 2:14 PM

## 2021-02-25 ENCOUNTER — Ambulatory Visit (INDEPENDENT_AMBULATORY_CARE_PROVIDER_SITE_OTHER)
Admission: EM | Admit: 2021-02-25 | Discharge: 2021-02-25 | Disposition: A | Discharge status: Other Acute Inpatient Hospital | Payer: Medicare Other | Source: Home / Self Care

## 2021-02-25 ENCOUNTER — Emergency Department (HOSPITAL_COMMUNITY)
Admission: EM | Admit: 2021-02-25 | Discharge: 2021-02-26 | Disposition: A | Discharge status: Other Acute Inpatient Hospital | Payer: Medicare Other | Attending: Emergency Medicine | Admitting: Emergency Medicine

## 2021-02-25 ENCOUNTER — Encounter (HOSPITAL_COMMUNITY): Payer: Self-pay | Admitting: Emergency Medicine

## 2021-02-25 ENCOUNTER — Other Ambulatory Visit: Payer: Self-pay

## 2021-02-25 DIAGNOSIS — R45851 Suicidal ideations: Secondary | ICD-10-CM | POA: Insufficient documentation

## 2021-02-25 DIAGNOSIS — F32A Depression, unspecified: Secondary | ICD-10-CM | POA: Insufficient documentation

## 2021-02-25 DIAGNOSIS — T50902A Poisoning by unspecified drugs, medicaments and biological substances, intentional self-harm, initial encounter: Secondary | ICD-10-CM | POA: Insufficient documentation

## 2021-02-25 DIAGNOSIS — Z96642 Presence of left artificial hip joint: Secondary | ICD-10-CM | POA: Diagnosis not present

## 2021-02-25 DIAGNOSIS — Z7982 Long term (current) use of aspirin: Secondary | ICD-10-CM | POA: Insufficient documentation

## 2021-02-25 DIAGNOSIS — F329 Major depressive disorder, single episode, unspecified: Secondary | ICD-10-CM | POA: Diagnosis present

## 2021-02-25 DIAGNOSIS — Z79899 Other long term (current) drug therapy: Secondary | ICD-10-CM | POA: Insufficient documentation

## 2021-02-25 DIAGNOSIS — F250 Schizoaffective disorder, bipolar type: Secondary | ICD-10-CM | POA: Insufficient documentation

## 2021-02-25 DIAGNOSIS — F419 Anxiety disorder, unspecified: Secondary | ICD-10-CM | POA: Insufficient documentation

## 2021-02-25 DIAGNOSIS — Z634 Disappearance and death of family member: Secondary | ICD-10-CM | POA: Insufficient documentation

## 2021-02-25 DIAGNOSIS — Z20822 Contact with and (suspected) exposure to covid-19: Secondary | ICD-10-CM | POA: Diagnosis not present

## 2021-02-25 DIAGNOSIS — F17210 Nicotine dependence, cigarettes, uncomplicated: Secondary | ICD-10-CM | POA: Insufficient documentation

## 2021-02-25 LAB — COMPREHENSIVE METABOLIC PANEL
ALT: 18 U/L (ref 0–44)
AST: 25 U/L (ref 15–41)
Albumin: 3.5 g/dL (ref 3.5–5.0)
Alkaline Phosphatase: 62 U/L (ref 38–126)
Anion gap: 6 (ref 5–15)
BUN: 18 mg/dL (ref 8–23)
CO2: 26 mmol/L (ref 22–32)
Calcium: 8.7 mg/dL — ABNORMAL LOW (ref 8.9–10.3)
Chloride: 107 mmol/L (ref 98–111)
Creatinine, Ser: 0.69 mg/dL (ref 0.44–1.00)
GFR, Estimated: >60 mL/min (ref >60)
Glucose, Bld: 106 mg/dL — ABNORMAL HIGH (ref 70–99)
Potassium: 3.7 mmol/L (ref 3.5–5.1)
Sodium: 139 mmol/L (ref 135–145)
Total Bilirubin: 0.7 mg/dL (ref 0.3–1.2)
Total Protein: 6.8 g/dL (ref 6.5–8.1)

## 2021-02-25 LAB — SALICYLATE LEVEL: Salicylate Lvl: <7 mg/dL — ABNORMAL LOW (ref 7.0–30.0)

## 2021-02-25 LAB — CBC WITH DIFFERENTIAL/PLATELET
Abs Immature Granulocytes: 0.01 10*3/uL (ref 0.00–0.07)
Basophils Absolute: 0 10*3/uL (ref 0.0–0.1)
Basophils Relative: 1 %
Eosinophils Absolute: 0.1 10*3/uL (ref 0.0–0.5)
Eosinophils Relative: 2 %
HCT: 39.8 % (ref 36.0–46.0)
Hemoglobin: 13.1 g/dL (ref 12.0–15.0)
Immature Granulocytes: 0 %
Lymphocytes Relative: 35 %
Lymphs Abs: 1.7 10*3/uL (ref 0.7–4.0)
MCH: 32.2 pg (ref 26.0–34.0)
MCHC: 32.9 g/dL (ref 30.0–36.0)
MCV: 97.8 fL (ref 80.0–100.0)
Monocytes Absolute: 0.4 10*3/uL (ref 0.1–1.0)
Monocytes Relative: 8 %
Neutro Abs: 2.6 10*3/uL (ref 1.7–7.7)
Neutrophils Relative %: 54 %
Platelets: 195 10*3/uL (ref 150–400)
RBC: 4.07 MIL/uL (ref 3.87–5.11)
RDW: 14.6 % (ref 11.5–15.5)
WBC: 4.8 10*3/uL (ref 4.0–10.5)
nRBC: 0 % (ref 0.0–0.2)

## 2021-02-25 LAB — RAPID URINE DRUG SCREEN, HOSP PERFORMED
Amphetamines: NOT DETECTED
Barbiturates: NOT DETECTED
Benzodiazepines: NOT DETECTED
Cocaine: POSITIVE — AB
Opiates: NOT DETECTED
Tetrahydrocannabinol: NOT DETECTED

## 2021-02-25 LAB — RESP PANEL BY RT-PCR (FLU A&B, COVID) ARPGX2
Influenza A by PCR: NEGATIVE
Influenza B by PCR: NEGATIVE
SARS Coronavirus 2 by RT PCR: NEGATIVE

## 2021-02-25 LAB — ETHANOL: Alcohol, Ethyl (B): <10 mg/dL (ref <10)

## 2021-02-25 LAB — ACETAMINOPHEN LEVEL: Acetaminophen (Tylenol), Serum: <10 ug/mL — ABNORMAL LOW (ref 10–30)

## 2021-02-25 NOTE — Discharge Instructions (Addendum)
Transfer to [place]

## 2021-02-25 NOTE — ED Provider Notes (Addendum)
[facility] EMERGENCY DEPARTMENT Provider Note   CSN: [id number] Arrival date & time: 02/25/21  1411     History Chief Complaint  Patient presents with   Depression   Suicidal    Terry Potter is a 65 y.o. female.  The history is provided by the patient. No language interpreter was used.  Depression This is a new problem. The current episode started more than 1 week ago. The problem occurs constantly. The problem has been gradually worsening. Pertinent negatives include no chest pain, no abdominal pain, no headaches and no shortness of breath. Nothing aggravates the symptoms. Nothing relieves the symptoms. She has tried nothing for the symptoms.  Pt reports she took medication in an attempt to harm herself.  Pt reports she has been very depressed since her Mother died.  Pt unsure of what medication she took  Pt reports she took brown pills that she is not suppose to take.  Pt states she has been using cocaine and knows she needs to get off of it.      Past Medical History:  Diagnosis Date   Arthritis    Bipolar disorder [facility])    Ectopic pregnancy     Patient Active Problem List   Diagnosis Date Noted   Grief 01/23/2021   PTSD (post-traumatic stress disorder) 08/13/2020   Cocaine use disorder, moderate, in controlled environment (HCC) 07/23/2020   Cocaine abuse with cocaine-induced mood disorder (HCC) 07/23/2020   Bacterial vaginosis 02/01/2018   Cocaine abuse (HCC) 02/01/2018   Pulmonary nodules 02/01/2018   Syncope 02/01/2018   Hip fracture, unspecified laterality, closed, initial encounter (HCC) 01/01/2017   Hip fracture (HCC) 01/01/2017   Displaced fracture of left femoral neck (HCC) 01/01/2017   Schizoaffective disorder, bipolar type (HCC) 03/19/2015    Class: Chronic    Past Surgical History:  Procedure Laterality Date   ABDOMINAL SURGERY     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   Procedure: TOTAL HIP ARTHROPLASTY ANTERIOR APPROACH;   Surgeon: Samson Frederic, MD;  Location: [facility] OR;  Service: Orthopedics;  Laterality: Left;   TUMOR REMOVAL     Uterine tumor removed     OB History   No obstetric history on file.     Family History  Problem Relation Age of Onset   Cancer Mother     Social History   Tobacco Use   Smoking status: Every Day    Packs/day: 0.25    Types: Cigarettes, Cigars   Smokeless tobacco: Never  Substance Use Topics   Alcohol use: Yes    Comment: occ   Drug use: Yes    Types: Cocaine    Comment: occ    Home Medications Prior to Admission medications   Medication Sig Start Date End Date Taking? Authorizing Provider  albuterol (PROAIR HFA) 108 (90 Base) MCG/ACT inhaler Inhale 2 puffs into the lungs every 6 (six) hours as needed for wheezing or shortness of breath. 10/03/19   Rancour, Jeannett Senior, MD  aspirin EC 325 MG EC tablet Take 1 tablet (325 mg total) by mouth 2 (two) times daily with a meal. 01/03/17   Porterfield, Hospital doctor, PA-C  budesonide-formoterol (SYMBICORT) 160-4.5 MCG/ACT inhaler Inhale 2 puffs into the lungs 2 (two) times daily.    [provider]  fluticasone (FLONASE) 50 MCG/ACT nasal spray Place 1 spray into both nostrils daily as needed for rhinitis.    [provider]  gabapentin (NEURONTIN) 300 MG capsule Take 1 capsule (300 mg total) by mouth 3 (  three) times daily. 01/23/21   Shanna Cisco, NP  mirtazapine (REMERON) 15 MG tablet Take 1 tablet (15 mg total) by mouth at bedtime. 01/23/21   Shanna Cisco, NP  Oxcarbazepine (TRILEPTAL) 300 MG tablet Take 1 tablet (300 mg total) by mouth 3 (three) times daily. 01/23/21   Shanna Cisco, NP  risperiDONE (RISPERDAL) 1 MG tablet Take 1 tablet (1 mg total) by mouth 2 (two) times daily. 01/23/21   Shanna Cisco, NP    Allergies    Tetracyclines & related  Review of Systems   Review of Systems  Respiratory:  Negative for shortness of breath.   Cardiovascular:  Negative for chest pain.   Gastrointestinal:  Negative for abdominal pain.  Neurological:  Negative for headaches.  Psychiatric/Behavioral:  Positive for depression.   All other systems reviewed and are negative.  Physical Exam Updated Vital Signs BP 118/67 (BP Location: Right Arm)   Pulse (!) 56   Temp 98.9 F (37.2 C) (Oral)   Resp 18   SpO2 100%   Physical Exam Vitals reviewed.  Constitutional:      Appearance: Normal appearance.  HENT:     Head: Normocephalic.     Mouth/Throat:     Mouth: Mucous membranes are moist.  Eyes:     Pupils: Pupils are equal, round, and reactive to light.  Cardiovascular:     Rate and Rhythm: Normal rate.  Pulmonary:     Effort: Pulmonary effort is normal.  Abdominal:     General: Abdomen is flat.  Musculoskeletal:        General: Normal range of motion.     Cervical back: Normal range of motion.  Skin:    General: Skin is warm.  Neurological:     General: No focal deficit present.     Mental Status: She is alert.  Psychiatric:        Mood and Affect: Mood normal.    ED Results / Procedures / Treatments   Labs (all labs ordered are listed, but only abnormal results are displayed) Labs Reviewed  COMPREHENSIVE METABOLIC PANEL - Abnormal; Notable for the following components:      Result Value   Glucose, Bld 106 (*)    Calcium 8.7 (*)    All other components within normal limits  SALICYLATE LEVEL - Abnormal; Notable for the following components:   Salicylate Lvl <7.0 (*)    All other components within normal limits  ACETAMINOPHEN LEVEL - Abnormal; Notable for the following components:   Acetaminophen (Tylenol), Serum <10 (*)    All other components within normal limits  RAPID URINE DRUG SCREEN, HOSP PERFORMED - Abnormal; Notable for the following components:   Cocaine POSITIVE (*)    All other components within normal limits  RESP PANEL BY RT-PCR (FLU A&B, COVID) ARPGX2  CBC WITH DIFFERENTIAL/PLATELET  ETHANOL    EKG None  Radiology No results  found.  Procedures Procedures   Medications Ordered in ED Medications - No data to display  ED Course  I have reviewed the triage vital signs and the nursing notes.  Pertinent labs & imaging results that were available during my care of the patient were reviewed by me and considered in my medical decision making (see chart for details).    MDM Rules/Calculators/A&P                           Pt took medications 9.5 hours ago.  Pt's  vitals and labs are stable.  Pt stable for TTS assessment.  [facility] recommended geripsych  placement  Final Clinical Impression(s) / ED Diagnoses Final diagnoses:  Depression, unspecified depression type    Rx / DC Orders ED Discharge Orders     None     An After Visit Summary was printed and given to the patient.   TTS consult for placement pending Osie Cheeks 02/25/21 1924    Osie Cheeks 02/25/21 2106    Linwood Dibbles, MD 02/26/21 [phone]

## 2021-02-25 NOTE — ED Provider Notes (Addendum)
[facility] Medical Screening Exam  Patient Name: Terry Potter MRN: [id number] Date of Evaluation: 02/25/21 Chief Complaint:   Diagnosis:  Final diagnoses:  Suicide attempt by drug ingestion, initial encounter [facility])    History of Present illness: Terry Potter is a 65 y.o. female patient presented to [facility] as a walk in voluntarily with [employer] with complaints of " my mom died 1 month ago and since then I cannot handle my depression".  Per chart review she has a history of schizoaffective bipolar type.  Her current medications are gabapentin, mirtazapine, Trileptal, prazosin, and Risperdal. Patient is a poor historian when it comes to her medications and doses dosages.  Her outpatient provider is Gretchen Short, NP for medication management and she currently is enrolled in therapy.  Reports she lives boardinghouse and has no support from her family.  Terry Potter, 65 y.o., female patient seen face to face by this provider, consulted with Dr. Bronwen Betters; and chart reviewed on 02/25/21.  On evaluation Terry Potter reports since her mother died she has had worsening depression and anxiety.  She has been using crack cocaine every day.  $10 or more per day.  States she drinks alcohol every day. Last used alcohol and crack last night.  During evaluation Terry Potter is in sitting position.  Her speech is clear, coherent, slowed and slurred.  She has a sleepy affect.  She is tearful throughout evaluation.  She is alert and oriented x4 but dozes off to sleep.  Endorses depression and anxiety.  States she only sleeps 2 hours per night.  Reports decrease in appetite.  Endorses suicidal ideations with plan, intent, and access to means.  States "I am so depressed and tired" Patient vaguely stated she took an intentional overdose of medications today and last night.  She is unable to state what medications or dosages that she has taken she cannot remember.  Reports it was  an attempt to end her life. Endorses homicidal ideations with a plan to stab her house mates in the heart with a knife. She does have intent and access to knives. Endorses auditory hallucinations. She hears voices, "they tell me to kill my self". Endorses visual hallucinations states, "I see shadows". She is paranoid, feels like people are out to get her and can read her mind.   Patient will be transferred to [facility] ED for medical clearance. After medically cleared patient is recommended for inpatient geropsych admission.     Psychiatric Specialty Exam  Presentation  General Appearance:Disheveled  Eye Contact:Fleeting  Speech:Slurred; Clear and Coherent  Speech Volume:Normal  Handedness:Right   Mood and Affect  Mood:Depressed; Hopeless  Affect:Congruent; Tearful   Thought Process  Thought Processes:Coherent  Descriptions of Associations:Intact  Orientation:Full (Time, Place and Person)  Thought Content:Logical; Paranoid Ideation  Diagnosis of Schizophrenia or Schizoaffective disorder in past: Yes  Duration of Psychotic Symptoms: Greater than six months  Hallucinations:Auditory; Visual voices tell her to kill her self sees shadows  Ideas of Reference:Paranoia  Suicidal Thoughts:Yes, Active With Intent; With Plan; With Means to Carry Out Without Intent; Without Plan (Thoughts of just giving up.  "I have my moments" referring to moments of suicidal thoughts)  Homicidal Thoughts:Yes, Active With Intent; With Means to Carry Out; With Plan Without Intent; Without Plan   Sensorium  Memory:Immediate Good; Recent Good; Remote Good  Judgment:Poor  Insight:Poor   Executive Functions  Concentration:Fair  Attention Span:Fair  Recall:Fair  Fund of Knowledge:Fair  Language:Fair  Psychomotor Activity  Psychomotor Activity:Decreased   Assets  Assets:Communication Skills; Desire for Improvement; Financial Resources/Insurance; Housing; Physical  Health   Sleep  Sleep:Poor  Number of hours: 2   No data recorded  Physical Exam: Physical Exam Vitals and nursing note reviewed.  Constitutional:      Appearance: Normal appearance.  HENT:     Head: Normocephalic.  Eyes:     General:        Right eye: No discharge.        Left eye: No discharge.     Conjunctiva/sclera: Conjunctivae normal.  Cardiovascular:     Rate and Rhythm: Normal rate.  Pulmonary:     Effort: Pulmonary effort is normal. No respiratory distress.  Musculoskeletal:        General: Normal range of motion.     Cervical back: Normal range of motion.  Skin:    Coloration: Skin is not jaundiced or pale.  Neurological:     Mental Status: She is alert and oriented to person, place, and time.  Psychiatric:        Attention and Perception: She perceives auditory and visual hallucinations.        Mood and Affect: Mood is anxious and depressed. Affect is tearful.        Speech: Speech is slurred.        Behavior: Behavior is slowed. Behavior is cooperative.        Thought Content: Thought content is paranoid. Thought content includes homicidal and suicidal ideation. Thought content includes homicidal and suicidal plan.        Cognition and Memory: Cognition normal.        Judgment: Judgment is impulsive.   Review of Systems  Constitutional: Negative.  Negative for chills and fever.  HENT: Negative.  Negative for hearing loss.   Eyes: Negative.   Respiratory: Negative.  Negative for cough.   Cardiovascular: Negative.  Negative for chest pain.  Musculoskeletal: Negative.   Skin: Negative.   Neurological: Negative.   Psychiatric/Behavioral:  Positive for depression, hallucinations, substance abuse and suicidal ideas. The patient is nervous/anxious.   Blood pressure (!) 79/51, pulse 70, temperature 97.6 F (36.4 C), resp. rate 16, SpO2 99 %. There is no height or weight on file to calculate BMI.  Musculoskeletal: Strength & Muscle Tone: within normal  limits Gait & Station: normal Patient leans: N/A   [facility] MSE Discharge Disposition for Follow up and Recommendations: Based on my evaluation the patient appears to have an emergency medical condition for which I recommend the patient be transferred to the emergency department for further evaluation.   Patient being transferred to [facility] ED for medical clearance. After medical clearance she meets criteria for inpatient Geropsychiatric admission.    Ardis Hughs, NP 02/25/2021, 1:07 PM

## 2021-02-25 NOTE — ED Provider Notes (Signed)
Emergency Medicine Provider Triage Evaluation Note  Terry Potter , a 65 y.o. female  was evaluated in triage.  Pt complains of si with od on meds.  Pt cannot tell what extra medications she took. She states she took extras of her prazosin which she is not supposed to take. She took the medications at 7 and 10am  Review of Systems  Positive: si Negative: fever  Physical Exam  BP 118/67 (BP Location: Right Arm)   Pulse (!) 56   Temp 98.9 F (37.2 C) (Oral)   Resp 18   SpO2 100%  Gen:   Awake, no distress   Resp:  Normal effort  MSK:   Moves extremities without difficulty  Other:  Clear speech  Medical Decision Making  Medically screening exam initiated at 2:23 PM.  Appropriate orders placed.  Terry Potter was informed that the remainder of the evaluation will be completed by another provider, this initial triage assessment does not replace that evaluation, and the importance of remaining in the ED until their evaluation is complete.  2:23 PM Spoke with Verlon Au with poison control. Monitor for hypotension, bradycardia, hyponatremia, seizures (rare occurrence). Monitor for Qtc prolongation. Recommends ivf, benzos, optimize electrolytes. Monitor for at least 8 hours.    [name] 02/25/21 1431    [name], MD 02/25/21 [phone]

## 2021-02-25 NOTE — ED Notes (Signed)
Pt transferred to [facility] via EMS for medical clearance for possible overdose of medictation. VS stable at time of transport. Safety maintained.

## 2021-02-25 NOTE — BH Assessment (Addendum)
Comprehensive Clinical Assessment (CCA) Note  02/25/2021 Terry Potter [id number]  Disposition: Per Terry Gambles, NP, patient will be transferred to ED for medical clearance due to concerns patient took medications. After patient is medically cleared, she is recommended for inpatient treatment.   The patient demonstrates the following risk factors for suicide: Chronic risk factors for suicide include: psychiatric disorder of bipolar, substance use disorder, and previous suicide attempts more than once . Acute risk factors for suicide include: family or marital conflict and loss (financial, interpersonal, professional). Protective factors for this patient include: positive therapeutic relationship. Considering these factors, the overall suicide risk at this point appears to be high. Patient is not appropriate for outpatient follow up.   Terry Potter is a 65 year old female presenting to [facility] voluntarily with [employer]. Patient reports she was at [facility] for appointment and during appointment patient told provider she was having SI with thoughts of OD on medications. [facility]. called [employer] to transport patient here. Patient reports SI but does not have a plan since providers took her medications. Patient also reports cocaine and beer use around 1:00am. Patient reports stressor related to death and conflict in her family. Patient reports her mother died 2 months ago and her daughter did not involve her in the cremation process. Patient reports her granddaughter was also in a bad car accident 2 months ago. Patient reports all of this happened around the same time her therapist stopped working with her at [facility]. Patient reports her drug and alcohol intake has increased and reports she became homeless about a week ago. Patient also reports using fentanyl last week.    Patient reports she was diagnosed with bipolar disorder and has been seeing Dr. Sharol Potter at [facility]. Patient reports her next appointment is  in November. Patient is not receiving counseling and reports improvement in symptoms while attending therapy. Patient reports history of inpatient treatment x5 with last hospitalization about 2 years ago for OD on pills. Patient reports history of substance abuse treatment and is willing to go into treatment today if recommended.  Patient is oriented to person, place and situation. Patient eye contact and tone of voice is normal. Patient is engaged, her affect is depressed with congruent mood. Patient reports SI denies having a plan since her medications were taken. Patient reports taking one pill today but unable to recall what pill she took. Patient becoming more somnolent after assessment and reports that she does not know how many pills she took. Patient reports thoughts of wanting to hurt her neighbor but denies wanting to kill neighbor.Patient reports she can hurt her neighbor by throwing away his clothes. Patient reports paranoia and AVH to NP.      Chief Complaint:  Chief Complaint  Patient presents with   Depression   Suicidal   Visit Diagnosis: Suicide attempt by drug ingestion, initial encounter (HCC)    CCA Screening, Triage and Referral (STR)  Patient Reported Information How did you hear about [place]? Primary Care  What Is the Reason for Your Visit/Call Today? Pt went to [facility] for appointment and during appointment pt told provider she was haivng SI with thoughts of OD on medications. [facility]. called [employer] to transport pt here.  How Long Has This Been Causing You Problems? 1-6 months  What Do You Feel Would Help You the Most Today? Treatment for Depression or other mood problem; Alcohol or Drug Use Treatment   Have You Recently Had Any Thoughts About Hurting Yourself? Yes  Are You Planning to Commit Suicide/Harm Yourself At This time? Yes   Have you Recently Had Thoughts About Hurting Someone Terry Potter? No  Are You Planning to Harm Someone at This Time? No  Explanation: Im  Hurt Angry Upset Scared (Phreesia 07/23/2020)   Have You Used Any Alcohol or Drugs in the Past 24 Hours? Yes  How Long Ago Did You Use Drugs or Alcohol? No data recorded What Did You Use and How Much? cocaine and alcohol   Do You Currently Have a Therapist/Psychiatrist? No (Phreesia 07/23/2020)  Name of Therapist/Psychiatrist: No data recorded  Have You Been Recently Discharged From Any Office Practice or Programs? No (Phreesia 07/23/2020)  Explanation of Discharge From Practice/Program: No data recorded    CCA Screening Triage Referral Assessment Type of Contact: Face-to-Face  Telemedicine Service Delivery:   Is this Initial or Reassessment? No data recorded Date Telepsych consult ordered in CHL:  No data recorded Time Telepsych consult ordered in CHL:  No data recorded Location of Assessment: [facility]  Provider Location: No data recorded  Collateral Involvement: N/A   Does Patient Have a Court Appointed Legal Guardian? No data recorded Name and Contact of Legal Guardian: No data recorded If Minor and Not Living with Parent(s), Who has Custody? No data recorded Is CPS involved or ever been involved? Never  Is APS involved or ever been involved? Never   Patient Determined To Be At Risk for Harm To Self or Others Based on Review of Patient Reported Information or Presenting Complaint? No  Method: No data recorded Availability of Means: No data recorded Intent: No data recorded Notification Required: No data recorded Additional Information for Danger to Others Potential: No data recorded Additional Comments for Danger to Others Potential: No data recorded Are There Guns or Other Weapons in Your Home? No data recorded Types of Guns/Weapons: No data recorded Are These Weapons Safely Secured?                            No data recorded Who Could Verify You Are Able To Have These Secured: No data recorded Do You Have any Outstanding Charges, Pending  Court Dates, Parole/Probation? No data recorded Contacted To Inform of Risk of Harm To Self or Others: No data recorded   Does Patient Present under Involuntary Commitment? No  IVC Papers Initial File Date: No data recorded  [place] of Residence: [place]   Patient Currently Receiving the Following Services: Not Receiving Services   Determination of Need: Emergent (2 hours)   Options For Referral: Medication Management; Outpatient Therapy     CCA Biopsychosocial Patient Reported Schizophrenia/Schizoaffective Diagnosis in Past: Yes   Strengths: Motivated towards treatment   Mental Health Symptoms Depression:   Hopelessness; Irritability; Tearfulness; Sleep (too much or little)   Duration of Depressive symptoms:  Duration of Depressive Symptoms: Greater than two weeks   Mania:   Irritability; Racing thoughts   Anxiety:    Sleep; Tension; Worrying   Psychosis:   Hallucinations   Duration of Psychotic symptoms:  Duration of Psychotic Symptoms: Greater than six months   Trauma:   None   Obsessions:   None   Compulsions:   None   Inattention:   N/A   Hyperactivity/Impulsivity:   N/A   Oppositional/Defiant Behaviors:   N/A   Emotional Irregularity:   Chronic feelings of emptiness; Mood lability   Other Mood/Personality Symptoms:  No data recorded   Mental Status Exam  Appearance and self-care  Stature:   Average   Weight:   Thin   Clothing:   Neat/clean   Grooming:   Normal   Cosmetic use:   Age appropriate   Posture/gait:   Tense   Motor activity:   Restless   Sensorium  Attention:   Normal   Concentration:   Variable   Orientation:   Object; Person; Place; Situation; Time; X5   Recall/memory:   Normal   Affect and Mood  Affect:   Depressed; Tearful   Mood:   Depressed   Relating  Eye contact:   Normal   Facial expression:   Depressed; Sad   Attitude toward examiner:   Cooperative   Thought and  Language  Speech flow:  Clear and Coherent   Thought content:   Appropriate to Mood and Circumstances   Preoccupation:   None   Hallucinations:   Auditory   Organization:  No data recorded  [employer] of Knowledge:   Average   Intelligence:   Average   Abstraction:   Normal   Judgement:   Fair   Dance movement psychotherapist:   Adequate   Insight:   Fair   Decision Making:   Normal   Social Functioning  Social Maturity:   Irresponsible   Social Judgement:   "Chief of Staff"   Stress  Stressors:   Housing; Family conflict; Grief/losses   Coping Ability:   Deficient supports   Skill Deficits:   Self-control   Supports:   Friends/Service system; Family     Religion: Religion/Spirituality Are You A Religious Person?: No  Leisure/Recreation: Leisure / Recreation Do You Have Hobbies?: Yes  Exercise/Diet: Exercise/Diet Do You Exercise?: No Have You Gained or Lost A Significant Amount of Weight in the Past Six Months?: No Do You Follow a Special Diet?: No Do You Have Any Trouble Sleeping?: Yes   CCA Employment/Education Employment/Work Situation: Employment / Work Situation Employment Situation: On disability Has Patient ever Been in Equities trader?: No  Education: Education Last Grade Completed:  (UTA) Did You Have An Individualized Education Program (IIEP): No Did You Have Any Difficulty At [employer]?: No   CCA Family/Childhood History Family and Relationship History: Family history Does patient have children?: Yes  Childhood History:  Childhood History By whom was/is the patient raised?:  (UTA) Did patient suffer any verbal/emotional/physical/sexual abuse as a child?: No Has patient ever been sexually abused/assaulted/raped as an adolescent or adult?: No Witnessed domestic violence?: No Has patient been affected by domestic violence as an adult?: No  Child/Adolescent Assessment:     CCA Substance Use Alcohol/Drug  Use: Alcohol / Drug Use Pain Medications: See MAR Prescriptions: See MAR Over the Counter: See MAR History of alcohol / drug use?: Yes Longest period of sobriety (when/how long): 3 yrs clean - recent relapse 6 mos ago Negative Consequences of Use: Personal relationships, Financial Withdrawal Symptoms: Irritability Substance #1 Name of Substance 1: Cocain 1 - Duration: ongoing 1 - Last Use / Amount: 1:00 am 02/25/21 Substance #2 Name of Substance 2: Alcohol 2 - Duration: ongoing 2 - Last Use / Amount: 1:00am 02/25/21                     ASAM's:  Six Dimensions of Multidimensional Assessment  Dimension 1:  Acute Intoxication and/or Withdrawal Potential:   Dimension 1:  Description of individual's past and current experiences of substance use and withdrawal: N/A - cocaine use  Dimension 2:  Biomedical Conditions and  Complications:   Dimension 2:  Description of patient's biomedical conditions and  complications: limited due to age and some weakness  Dimension 3:  Emotional, Behavioral, or Cognitive Conditions and Complications:  Dimension 3:  Description of emotional, behavioral, or cognitive conditions and complications: underlying diagnosis of Schizoaffective Disorder, bipolar type - pt admits to self-medicating  Dimension 4:  Readiness to Change:  Dimension 4:  Description of Readiness to Change criteria: Open to CD-IOP treatment  Dimension 5:  Relapse, Continued use, or Continued Problem Potential:  Dimension 5:  Relapse, continued use, or continued problem potential critiera description: Patient has done well with sobriety at points she has been stable on medications  Dimension 6:  Recovery/Living Environment:  Dimension 6:  Recovery/Iiving environment criteria description: Some support from daughter  ASAM Severity Score: ASAM's Severity Rating Score: 7  ASAM Recommended Level of Treatment: ASAM Recommended Level of Treatment: Level II Intensive Outpatient Treatment    Substance use Disorder (SUD) Substance Use Disorder (SUD)  Checklist Symptoms of Substance Use: Continued use despite having a persistent/recurrent physical/psychological problem caused/exacerbated by use, Continued use despite persistent or recurrent social, interpersonal problems, caused or exacerbated by use  Recommendations for Services/Supports/Treatments: Recommendations for Services/Supports/Treatments Recommendations For Services/Supports/Treatments: CD-IOP Intensive Chemical Dependency Program  Discharge Disposition:    DSM5 Diagnoses: Patient Active Problem List   Diagnosis Date Noted   Grief 01/23/2021   PTSD (post-traumatic stress disorder) 08/13/2020   Cocaine use disorder, moderate, in controlled environment (HCC) 07/23/2020   Cocaine abuse with cocaine-induced mood disorder (HCC) 07/23/2020   Bacterial vaginosis 02/01/2018   Cocaine abuse (HCC) 02/01/2018   Pulmonary nodules 02/01/2018   Syncope 02/01/2018   Hip fracture, unspecified laterality, closed, initial encounter (HCC) 01/01/2017   Hip fracture (HCC) 01/01/2017   Displaced fracture of left femoral neck (HCC) 01/01/2017   Schizoaffective disorder, bipolar type (HCC) 03/19/2015    Class: Chronic     Referrals to Alternative Service(s): Referred to Alternative Service(s):   Place:   Date:   Time:    Referred to Alternative Service(s):   Place:   Date:   Time:    Referred to Alternative Service(s):   Place:   Date:   Time:    Referred to Alternative Service(s):   Place:   Date:   Time:     Audree Camel, [facility]

## 2021-02-25 NOTE — BH Assessment (Signed)
Pt went to [facility] for appointment and during appointment pt told provider she was haivng SI with thoughts of OD on medications. [facility]. called [employer] to transport pt here. Pt reports SI, don't have a plan since providers took her medications. Denies HI, AVH. Cocaine and beer use around 1am. Homeless for past two weeks. Willing to go into rehab treatment.   Pt is emergent

## 2021-02-25 NOTE — ED Triage Notes (Signed)
Pt arrives via EMS - pt went to her primary MD and told them she was feeling suicidal. Pt stays at a boarding house and has been feeling depressed. Pt was told to stop taking one of her psych meds today by her MD. When she left, pt took all her meds and then took a "green pill" and possibly a handful of her home meds. Pt took these at 10 am. Pt went from her MD to [facility]. She was sent here from [facility] due to her BP. Initial BP 64 systolic, HR 48. CBG 99, sats 99% on room air. Pt is alert and oriented.

## 2021-02-25 NOTE — ED Notes (Signed)
Pt changed into maroon scrubs and belongings placed in bag.

## 2021-02-26 MED ORDER — ACETAMINOPHEN 325 MG PO TABS
650.0000 mg | ORAL_TABLET | Freq: Four times a day (QID) | ORAL | Status: DC | PRN
  Administered 2021-02-26: 650 mg via ORAL
  Filled 2021-02-26: qty 2

## 2021-02-26 NOTE — ED Provider Notes (Signed)
Patient accepted to [facility]. Sitting up eating a snack. Amenable to transfer. EMTALA completed.    Pollyann Savoy, MD 02/26/21 [phone]

## 2021-02-26 NOTE — ED Notes (Signed)
Per Poison control and Dr Wilkie Aye everything is completed on this patient just awaiting placement.

## 2021-02-26 NOTE — Progress Notes (Signed)
Patient has been recommended for geri psych and has been faxed out. Patient meets inpatient criteria per Julaine Fusi. Patient referred to the following facilities:  [facility]  [address] [place] [address] [phone]  [facility] [address]  [address], [place] [address] [phone]  [facility]  [address] [place], [place] [address] [phone]  [facility]  [address] [place] [address] [phone]  [facility]  [address] [place], [place] [address] [phone]  [facility]  [address] [place] [address] [phone]  [facility]  [address] [place] [address] [phone]  [facility]  [address] [place] [address] [phone]  [facility]  [address] [place] [address] [phone]  [facility]  [address], [place] [address] [phone]  [facility]  [address], [place] [address] [phone]  [facility]  [address], [place] [address] [phone]  [facility]  [address], [place] [address] [phone]  [facility]  [address] [place], [place] [address] [phone]  [facility]  [address], [place] [address] [phone]  [facility]  [address], [place] [address] [phone]    CSW will continue to monitor disposition.    Damita Dunnings, MSW, LCSW-A  10:10 AM  02/26/2021

## 2021-02-26 NOTE — ED Notes (Signed)
Pt left with all belongings given to safe transport, [facility] to [facility], RN called report to [employer].

## 2021-03-12 ENCOUNTER — Ambulatory Visit (INDEPENDENT_AMBULATORY_CARE_PROVIDER_SITE_OTHER): Payer: Medicare Other | Admitting: Clinical

## 2021-03-12 ENCOUNTER — Other Ambulatory Visit: Payer: Self-pay

## 2021-03-12 ENCOUNTER — Ambulatory Visit (HOSPITAL_COMMUNITY): Payer: Medicare Other | Admitting: Licensed Clinical Social Worker

## 2021-03-12 DIAGNOSIS — F250 Schizoaffective disorder, bipolar type: Secondary | ICD-10-CM | POA: Diagnosis not present

## 2021-03-12 NOTE — Progress Notes (Signed)
   THERAPIST PROGRESS NOTE  Session Time: 35 minutes  Participation Level: Active  Behavioral Response: CasualAlertEuthymic  Type of Therapy: Individual Therapy  Treatment Goals addressed: Coping  Interventions: CBT and Supportive  Summary:  Terry Potter is a 65 y.o. female who presents for the scheduled session oriented x5, appropriately dressed, and friendly.  Client presented as a walk in for initial appointment with this therapist.  Client reported on today she is doing fairly well.  Client reported since she was last seen by her previous therapist she was admitted to the hospital for what was labeled as a suicide attempt.  Client reported she did not disclose at the time of her hospitalization because she was ashamed to do so because it was related to a accidental drug overdose.  Client reported she was given fentanyl by avoiding her community.  Client reported she recognized too late that it was fentanyl active she used to substance.  Client reported since she knew she wanted hospitalization treatment to help stabilize her mental condition anyway she disclosed that she was going to hurt herself so she will be admitted.  Client reported she has been living in an environment where the landlord noticed that their tenants were selling drugs out of it.  Client reported she feels hopeful because a good friend of hers has offered her a room to stay at the house with her and her husband.  Client reported the environment will be better for her and she is looking forward to the move within the next few days.  I reported she is being medication compliant and staying hopeful for improvement to keep her motivated as she transitions to a new community live in.    Suicidal/Homicidal: Nowithout intent/plan  Therapist Response:  Therapist began the appointment making introductions and discussing confidentiality. Therapist used CBT to engage with client and ask her how she has been doing since last  seen. Therapist used CBT to utilize active listening and positive emotional support towards her thoughts and feelings. Therapist used CBT to engage with client to ask about medication compliance compared to ongoing mental health symptoms. Therapist assigned the client homework to practice self-care such as medication compliant, getting enough sleep, and distancing herself from negative influences. Client was scheduled for next appointment. Therapist addressed questions and concerns.     Plan: Return again in 6 weeks.  Diagnosis: schizoaffective disorder, bipolar type  Neena Rhymes Bastion Bolger, LCSW 03/12/2021

## 2021-03-20 ENCOUNTER — Other Ambulatory Visit (HOSPITAL_COMMUNITY): Payer: Self-pay | Admitting: Psychiatry

## 2021-04-28 ENCOUNTER — Ambulatory Visit (INDEPENDENT_AMBULATORY_CARE_PROVIDER_SITE_OTHER): Payer: Medicare Other | Admitting: Clinical

## 2021-04-28 ENCOUNTER — Ambulatory Visit (INDEPENDENT_AMBULATORY_CARE_PROVIDER_SITE_OTHER): Payer: Medicare Other | Admitting: Psychiatry

## 2021-04-28 ENCOUNTER — Encounter (HOSPITAL_COMMUNITY): Payer: Self-pay | Admitting: Psychiatry

## 2021-04-28 ENCOUNTER — Other Ambulatory Visit: Payer: Self-pay

## 2021-04-28 DIAGNOSIS — F250 Schizoaffective disorder, bipolar type: Secondary | ICD-10-CM

## 2021-04-28 MED ORDER — PRAZOSIN HCL 2 MG PO CAPS: ORAL_CAPSULE | ORAL | 3 refills | Status: DC

## 2021-04-28 MED ORDER — OXCARBAZEPINE 300 MG PO TABS: 300.0000 mg | ORAL_TABLET | Freq: Three times a day (TID) | ORAL | 3 refills | Status: AC

## 2021-04-28 MED ORDER — GABAPENTIN 300 MG PO CAPS: 300.0000 mg | ORAL_CAPSULE | Freq: Three times a day (TID) | ORAL | 3 refills | Status: AC

## 2021-04-28 MED ORDER — RISPERIDONE 1 MG PO TABS: 1.0000 mg | ORAL_TABLET | Freq: Two times a day (BID) | ORAL | 3 refills | Status: AC

## 2021-04-28 MED ORDER — MIRTAZAPINE 15 MG PO TABS: 15.0000 mg | ORAL_TABLET | Freq: Every day | ORAL | 3 refills | Status: AC

## 2021-04-28 NOTE — Progress Notes (Signed)
BH MD/PA/NP OP Progress Note   01/23/2021 2:14 PM Terry Potter  MRN:  [id number]  Chief Complaint: "Im scrambled" Chief Complaint   Medication Management     HPI: 65 year old female seen today for follow-up psychiatric evaluation. She has psychiatric history of cocaine dependence, schizoaffective disorder bipolar type and SA. She is currently managed on Gabapentin 300mg  TID, mirtazapine 15mg  nightly, Trileptal 300 TID, and Risperdal 1mg  BID.  She informed that she restarted prazosin 2 mg and reports that it has been effective.  She notes her medications are effective in managing her psychiatric conditions.   Today, patient is fairly groomed, pleasant, cooperative, engaged in conversation, and maintaining eye contact.  At times to conversation patient became tearful.  She notes that she is overwhelmed by her current living situation.  She informed gave up her room at a rooming house and is now on the verge of being homeless.  She notes that she stays with a friend however reports that he has been more aggressive and mean.  She notes that he is giving her a week to vacate his premises.  Patient asked provider if she was knowledgeable about any places to stay.  Provider gave patient number to [facility] and scheduled her an appointment with the care management team to help manage with housing.  She also asked provider to utilize the form so that she can call the housing authority to review her voucher and her old rooming house.  Patient was told by manager of the rooming house that he may have availabilities at the beginning of the month.  Provider unable to do a GAD-7 and PHQ-9 today as patient was overwhelmed.  She does note that she follows up with therapy regularly and reports that it has been effective.  She endorses adequate sleep and appetite.  Today she denies SI/HI/VH, mania, or paranoia.    Patient was seen at [facility] on h 02/25/2021 presenting with suicidal ideation and  depression.  At that time patient was not admitted and medications were not readjusted.  Patient did note that she followed up at [facility] and was admitted for time however provider does not have documentation of this visit.  Patient showed provider medications that were filled and they were the same prescriptions that she currently was managed on.  Patient's received a rapid UDA on 9/13 and was positive for cocaine however denies recent drug use.  At this time patient notes that her issues are more social stressors and placed medications not be adjusted.  Patient does have an appointment with the care management team on 04/28/2021.  She will follow-up with outpatient counseling for therapy.  No other concerns at this time.  Visit Diagnosis:    ICD-10-CM   1. Grief  F43.21     2. 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   Procedure: TOTAL HIP ARTHROPLASTY ANTERIOR APPROACH;  Surgeon: 10/13, MD;  Location: [facility] OR;  Service: Orthopedics;  Laterality: Left;   TUMOR REMOVAL     Uterine tumor removed    Family Psychiatric History: Notes that daughter has mental health issues but does not know which one, Mother anxiety and father bipolar disorder and anxiety  Family History:  Family History  Problem Relation Age of Onset   Cancer Mother     Social History:  Social History   Socioeconomic History   Marital status: Legally Separated    Spouse name: Not on file   Number of children: Not  on file   Years of education: Not on file   Highest education level: Not on file  Occupational History   Not on file  Tobacco Use   Smoking status: Every Day    Packs/day: 0.25    Types: Cigarettes, Cigars   Smokeless tobacco: Never  Substance and Sexual Activity   Alcohol use: Yes    Comment: occ   Drug use: Yes    Types: Cocaine    Comment: occ   Sexual activity: Not on file  Other Topics Concern   Not on file  Social History Narrative   Not on file   Social Determinants of Health   Financial Resource Strain: Not on file  Food Insecurity: Not on file  Transportation Needs: Not on file  Physical Activity: Not on file  Stress: Not on file  Social Connections: Not on file    Allergies:  Allergies  Allergen Reactions   Tetracyclines & Related Hives    Metabolic Disorder Labs: Lab Results  Component Value Date   HGBA1C 5.0 01/02/2017   MPG 97 01/02/2017   No results found for: PROLACTIN No results found for: CHOL, TRIG, HDL, CHOLHDL, VLDL, LDLCALC No results found for: TSH  Therapeutic Level Labs: No results found for: LITHIUM No results found for: VALPROATE No components found for:  CBMZ  Current Medications: Current Outpatient Medications  Medication Sig Dispense Refill   albuterol (PROAIR HFA) 108 (90 Base) MCG/ACT inhaler Inhale 2 puffs into the lungs every 6 (six) hours as needed for wheezing or shortness of breath. 6.7 g 0   aspirin EC 325 MG EC tablet Take 1 tablet (325 mg total) by mouth 2 (two) times daily with a meal. 30 tablet 0   budesonide-formoterol (SYMBICORT) 160-4.5 MCG/ACT inhaler Inhale 2 puffs into the lungs 2 (two) times daily.     fluticasone (FLONASE) 50 MCG/ACT nasal spray Place 1 spray into both nostrils daily as needed for rhinitis.     gabapentin (NEURONTIN) 300 MG capsule Take 1 capsule (300 mg total) by mouth 3 (three) times daily. 90 capsule 3   mirtazapine (REMERON) 15 MG tablet Take 1 tablet (15 mg total) by mouth at bedtime.  30 tablet 3   naproxen (NAPROSYN) 500 MG tablet Take 500 mg by mouth 2 (two) times daily as needed for mild pain.     Oxcarbazepine (TRILEPTAL) 300 MG tablet Take 1 tablet (300 mg total) by mouth 3 (three) times daily. 90 tablet 3   risperiDONE (RISPERDAL) 1 MG tablet Take 1 tablet (1 mg total) by mouth 2 (two) times daily. 60 tablet 3   No current facility-administered medications for this visit.     Musculoskeletal: Strength & Muscle Tone: within normal limits Gait & Station: normal,   Patient leans: N/A  Psychiatric Specialty Exam: Review of Systems  Blood pressure 115/80, pulse 71, height 5\' 4"  (1.626 m), weight 107 lb (48.5 kg), SpO2 100 %.Body mass index is 18.37 kg/m.  General Appearance:  Well Groomed  Eye Contact:  Good  Speech:  Clear and Coherent and Normal Rate  Volume:  Normal  Mood:  Euthymic and notes that she has increased anxiety and depression due living situations  Affect:  Appropriate and Congruent  Thought Process:  Coherent, Goal Directed and Linear  Orientation:  Full (Time, Place, and Person)  Thought Content: WDL and Logical   Suicidal Thoughts:  No  Homicidal Thoughts:  No  Memory:  Immediate;   Good Recent;   Good Remote;   Good  Judgement:  Good  Insight:  Good  Psychomotor Activity:  Normal  Concentration:  Concentration: Good and Attention Span: Good  Recall:  Good  Fund of Knowledge: Good  Language: Good  Akathisia:  No  Handed:  Right  AIMS (if indicated): Not done  Assets:  Communication Skills Desire for Improvement Financial Resources/Insurance Housing Leisure Time  ADL's:  Intact  Cognition: WNL  Sleep:  Good   Screenings: GAD-7    Flowsheet Row Clinical Support from 01/23/2021 in [facility] Video Visit from 10/23/2020 in [facility] Office Visit from 08/13/2020 in [facility]  Total GAD-7 Score 11 6 21       PHQ2-9    Flowsheet Row  Clinical Support from 01/23/2021 in [facility] Video Visit from 10/23/2020 in [facility] Office Visit from 08/13/2020 in [facility] ED from 07/23/2020 in [facility]  PHQ-2 Total Score 4 0 6 6  PHQ-9 Total Score 14 0 23 22      Flowsheet Row ED [facility] Video Visit from 10/23/2020 in [facility] Office Visit from 08/13/2020 in [facility]  C-SSRS RISK CATEGORY No Risk No Risk Error: Q3, 4, or 5 should not be populated when Q2 is No        Assessment and Plan: Patient notes that she she has increased anxiety and depression due to her current living situation.  She however notes that this is situational and request that medications not be changed.  Patient referred to care management team for assistance with housing.  1. Schizoaffective disorder, bipolar type (HCC)  Continue- gabapentin (NEURONTIN) 300 MG capsule; Take 1 capsule (300 mg total) by mouth 3 (three) times daily.  Dispense: 90 capsule; Refill: 3 Continue- mirtazapine (REMERON) 15 MG tablet; Take 1 tablet (15 mg total) by mouth at bedtime.  Dispense: 30 tablet; Refill: 3 Continue- Oxcarbazepine (TRILEPTAL) 300 MG tablet; Take 1 tablet (300 mg total) by mouth 3 (three) times daily.  Dispense: 90 tablet; Refill: 3 Continue- prazosin (MINIPRESS) 2 MG capsule; Take 1 capsule (2 mg total) by mouth at bedtime.  Dispense: 30 capsule; Refill: 3 Continue- risperiDONE (RISPERDAL) 1 MG tablet; Take 1 tablet (1 mg total) by mouth 2 (two) times daily.  Dispense: 30 tablet; Refill: 3      Follow up in 3 months     [facility], NP 01/23/2021, 2:14 PM

## 2021-04-29 ENCOUNTER — Telehealth (HOSPITAL_COMMUNITY): Payer: Self-pay | Admitting: Emergency Medicine

## 2021-04-29 ENCOUNTER — Ambulatory Visit (HOSPITAL_COMMUNITY): Payer: Medicare Other | Admitting: Clinical

## 2021-04-29 NOTE — BH Assessment (Signed)
Care Management   Patient reports that she is in need of housing.  Patient reports that she is living with a female friend and he has asked her to leave his home in 7 days.   Patient reports that she is able to pay $600 towards rent.    Writer provided patient with the following resources:   [employer]: [phone]  [facility]: [phone]  [facility] [employer] [phone]  List of apartments and barding rooms for rent: [facility]  [address]  [place], [place]  [phone] $470.00  [facility]  [address]  [place]  [phone] $520.00  [address]  [place]  [phone] $550.00  [address] [place] [phone]  $550.00  [facility]  [address]  [place]  [phone] $610.00

## 2021-05-06 NOTE — Progress Notes (Signed)
   THERAPIST PROGRESS NOTE  Session Time: 40 minutes  Participation Level: Active  Behavioral Response: CasualAlertEuthymic  Type of Therapy: Individual Therapy  Treatment Goals addressed: Coping  Interventions: CBT and Supportive  Summary:  Terry Potter is a 65 y.o. female who presents for the scheduled session oriented times five, appropriately dressed and friendly. Client denied hallucinations and delusions. Client reported on today she has been feeling stressed and some depressed. Client reported previously stating she was going to stay with a friend. Client reported she now has a week to move out of the house within the week. Client reported their life style doesn't positive reflect the environment she wants to be in. Client reported her primary need today is to get help with locating housing.   Suicidal/Homicidal: Nowithout intent/plan  Therapist Response:  Therapist began the session asking the client how she has been doing since last seen. Therapist used CBT to utilize active listening, empathy, and positive emotional support. Therapist used CBT to engage and assist the client by making phone calls to various homeless shelters in the [place], [place]. Therapist provided the client with an informational sheet to homeless shelters in the [place] area. Client was scheduled for next appointment.    Plan: Return again in 5 weeks.  Diagnosis: Schizoaffective disorder, bipolar type   Neena Rhymes Adrian Specht, LCSW 04/28/2021

## 2021-05-13 ENCOUNTER — Other Ambulatory Visit: Payer: Self-pay | Admitting: Registered Nurse

## 2021-05-13 DIAGNOSIS — Z780 Asymptomatic menopausal state: Secondary | ICD-10-CM

## 2021-07-22 ENCOUNTER — Encounter (HOSPITAL_COMMUNITY): Payer: Self-pay

## 2021-07-22 ENCOUNTER — Telehealth (HOSPITAL_COMMUNITY): Payer: Medicare Other | Admitting: Psychiatry

## 2021-07-22 ENCOUNTER — Ambulatory Visit (HOSPITAL_COMMUNITY): Payer: Medicare Other | Admitting: Clinical

## 2021-10-06 ENCOUNTER — Other Ambulatory Visit (HOSPITAL_COMMUNITY): Payer: Self-pay | Admitting: Psychiatry

## 2022-02-22 ENCOUNTER — Emergency Department (HOSPITAL_COMMUNITY): Payer: Medicare Other

## 2022-02-22 ENCOUNTER — Other Ambulatory Visit: Payer: Self-pay

## 2022-02-22 ENCOUNTER — Encounter (HOSPITAL_COMMUNITY): Payer: Self-pay | Admitting: Emergency Medicine

## 2022-02-22 ENCOUNTER — Emergency Department (HOSPITAL_COMMUNITY)
Admission: EM | Admit: 2022-02-22 | Discharge: 2022-02-22 | Disposition: A | Discharge status: Home / Self Care | Payer: Medicare Other | Attending: Emergency Medicine | Admitting: Emergency Medicine

## 2022-02-22 DIAGNOSIS — Z7982 Long term (current) use of aspirin: Secondary | ICD-10-CM | POA: Diagnosis not present

## 2022-02-22 DIAGNOSIS — Z96642 Presence of left artificial hip joint: Secondary | ICD-10-CM | POA: Diagnosis not present

## 2022-02-22 DIAGNOSIS — M25551 Pain in right hip: Secondary | ICD-10-CM | POA: Diagnosis present

## 2022-02-22 MED ORDER — KETOROLAC TROMETHAMINE 15 MG/ML IJ SOLN: 15.0000 mg | Freq: Once | INTRAMUSCULAR | Status: DC

## 2022-02-22 MED ORDER — IBUPROFEN 800 MG PO TABS: 800.0000 mg | ORAL_TABLET | Freq: Three times a day (TID) | ORAL | 0 refills | Status: DC

## 2022-02-22 MED ORDER — LIDOCAINE 5 % EX PTCH
1.0000 | MEDICATED_PATCH | CUTANEOUS | 0 refills | Status: DC
Start: 2022-02-22 — End: 2022-09-09

## 2022-02-22 MED ORDER — LIDOCAINE 5 % EX PTCH: 1.0000 | MEDICATED_PATCH | CUTANEOUS | Status: DC

## 2022-02-22 NOTE — ED Provider Notes (Signed)
[facility]-EMERGENCY DEPT Provider Note   CSN: [id number] Arrival date & time: 02/22/22  1235     History  Chief Complaint  Patient presents with   Hip Pain    Terry Potter is a 66 y.o. female.   Hip Pain   66 year old female presents emergency department with complaints of right hip pain.  She states that symptoms been present for approximately past 6 months.  She states she has had 2 falls over the course that time with her most recent fall being a month and a half ago.  She states she has been able to ambulate but with pain to the right hip.  Denies sensory deficits or weakness in right leg.  She states she has been taking some at home Aleve with minimal to no relief of symptoms.  Denies any associated back pain, bladder/bowel dysfunction, saddle anesthesia, fever, history of IV drug use.  Past medical history significant for left total hip arthroplasty, bipolar disorder, cocaine abuse, schizoaffective disorder, cocaine use, PTSD  Home Medications Prior to Admission medications   Medication Sig Start Date End Date Taking? Authorizing Provider  ibuprofen (ADVIL) 800 MG tablet Take 1 tablet (800 mg total) by mouth 3 (three) times daily. 02/22/22  Yes Sherian Maroon A, PA  lidocaine (LIDODERM) 5 % Place 1 patch onto the skin daily. Remove & Discard patch within 12 hours or as directed by MD 02/22/22  Yes Sherian Maroon A, PA  albuterol (PROAIR HFA) 108 (90 Base) MCG/ACT inhaler Inhale 2 puffs into the lungs every 6 (six) hours as needed for wheezing or shortness of breath. 10/03/19   Rancour, Jeannett Senior, MD  ALLERGY RELIEF 10 MG tablet Take 10 mg by mouth daily. 01/01/21   [provider]  aspirin EC 325 MG EC tablet Take 1 tablet (325 mg total) by mouth 2 (two) times daily with a meal. 01/03/17   [place], Hospital doctor, PA-C  budesonide-formoterol (SYMBICORT) 160-4.5 MCG/ACT inhaler Inhale 2 puffs into the lungs 2 (two) times daily.    [provider]  fluticasone (FLONASE) 50 MCG/ACT nasal spray Place 1 spray into both nostrils daily as needed for rhinitis.    [provider]  gabapentin (NEURONTIN) 300 MG capsule Take 1 capsule (300 mg total) by mouth 3 (three) times daily. 04/28/21   Shanna Cisco, NP  mirtazapine (REMERON) 15 MG tablet Take 1 tablet (15 mg total) by mouth at bedtime. 04/28/21   Shanna Cisco, NP  Oxcarbazepine (TRILEPTAL) 300 MG tablet Take 1 tablet (300 mg total) by mouth 3 (three) times daily. 04/28/21   Shanna Cisco, NP  prazosin (MINIPRESS) 2 MG capsule TAKE 1 CAPSULE (2 MG TOTAL) BY MOUTH AT BEDTIME. 04/28/21   Toy Cookey E, NP  risperiDONE (RISPERDAL) 1 MG tablet Take 1 tablet (1 mg total) by mouth 2 (two) times daily. 04/28/21   Shanna Cisco, NP      Allergies    Tetracyclines & related    Review of Systems   Review of Systems  All other systems reviewed and are negative.   Physical Exam Updated Vital Signs BP 121/67   Pulse 64   Temp 98.7 F (37.1 C) (Oral)   Resp 16   SpO2 96%  Physical Exam Vitals and nursing note reviewed.  Constitutional:      General: She is not in acute distress.    Appearance: Normal appearance. She is well-developed.  HENT:     Head: Normocephalic and atraumatic.  Eyes:     Conjunctiva/sclera: Conjunctivae normal.  Cardiovascular:     Rate and Rhythm: Normal rate and regular rhythm.     Heart sounds: No murmur heard. Pulmonary:     Effort: Pulmonary effort is normal. No respiratory distress.     Breath sounds: Normal breath sounds.  Abdominal:     Palpations: Abdomen is soft.     Tenderness: There is no abdominal tenderness.  Musculoskeletal:        General: No swelling. Normal range of motion.     Cervical back: Neck supple.     Right lower leg: No edema.     Left lower leg: No edema.     Comments: Patient has full range of motion of flexion and extension of her right hip but with pain.  She is able to ambulate but  causes pain in her right hip.  Dorsalis pedis pulses full and intact bilaterally.  Muscle strength 5 out of 5 for bilateral knee flexion and extension.  Patient complaining no sensory deficits along major nerve distributions of lower extremities.  No overlying skin abnormalities noted on patient's right hip.  No midline tenderness of lumbar area with no obvious step-off or deformity noted.  Skin:    General: Skin is warm and dry.     Capillary Refill: Capillary refill takes less than 2 seconds.  Neurological:     Mental Status: She is alert.  Psychiatric:        Mood and Affect: Mood normal.     ED Results / Procedures / Treatments   Labs (all labs ordered are listed, but only abnormal results are displayed) Labs Reviewed - No data to display  EKG None  Radiology DG Hip Unilat With Pelvis 2-3 Views Right  Result Date: 02/22/2022 CLINICAL DATA:  Trauma, fall, pain EXAM: DG HIP (WITH OR WITHOUT PELVIS) 2-3V RIGHT COMPARISON:  None Available. FINDINGS: There is previous left hip arthroplasty. No definite recent displaced fracture is seen in the neck of the right femur. Severe degenerative changes are noted in right hip. There is sclerosis and minimal flattening of lateral aspect of head of the right femur. IMPRESSION: No recent displaced fracture or dislocation is seen. Severe degenerative changes are noted in right hip. 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 Electronically Signed   By: Ernie Avena M.D.   On: 02/22/2022 13:26    Procedures Procedures    Medications Ordered in ED Medications  lidocaine (LIDODERM) 5 % 1 patch (has no administration in time range)  ketorolac (TORADOL) 15 MG/ML injection 15 mg (has no administration in time range)    ED Course/ Medical Decision Making/ A&P                           Medical Decision Making Amount and/or Complexity of Data Reviewed Radiology:  ordered.   This patient presents to the ED for concern of right hip pain, this involves an extensive number of treatment options, and is a complaint that carries with it a high risk of complications and morbidity.  The differential diagnosis includes fracture, strain/sprain, trochanteric bursitis, IT band syndrome, osteoarthritis, AVN, septic arthritis   Co morbidities that complicate the patient evaluation  See HPI   Additional history obtained:  Additional history obtained from EMR External records from outside source obtained and reviewed including hip x-ray from 12/2016   Lab Tests:  N/a  Imaging Studies ordered:  I ordered imaging studies including DG pelvis with right hip I independently visualized and interpreted imaging which showed no recent displaced fracture or dislocation.  Severe degenerative changes in the right hip.  Sclerosis and minimal flattening of the lateral aspect of head of the right femur suggesting of possible AVN.  Patient denies any recent steroid use I agree with the radiologist interpretation  Cardiac Monitoring: / EKG:  The patient was maintained on a cardiac monitor.  I personally viewed and interpreted the cardiac monitored which showed an underlying rhythm of: Sinus rhythm   Consultations Obtained:  N/a   Problem List / ED Course / Critical interventions / Medication management  Right hip pain I ordered medication including Lidoderm and Toradol for pain   Reevaluation of the patient after these medicines showed that the patient improved I have reviewed the patients home medicines and have made adjustments as needed   Social Determinants of Health:  Chronic cocaine use.  Chronic cigarette use.   Test / Admission - Considered:  R hip pain Vitals signs e within normal range and stable throughout visit. Imaging studies significant for: See above Patient symptoms likely secondary to severe degenerative joint changes with possible AVN  as described in imaging above.  Patient able to ambulate but with pain.  Given Lidoderm as well as Toradol in emergency department with some relief of symptoms.  Further work-up in the ED deemed unnecessary at this time given ambulatory patient status as well as lack of acute findings.  Close follow-up with orthopedics recommended outpatient for further management of patient's symptoms.  Treatment plan was discussed at length with patient and she acknowledged understanding was agreeable to said plan.  Further therapy with NSAIDs, rest, Lidoderm, ice recommended. Worrisome signs and symptoms were discussed with the patient, and the patient acknowledged understanding to return to the ED if noticed. Patient was stable upon discharge.         Final Clinical Impression(s) / ED Diagnoses Final diagnoses:  Right hip pain    Rx / DC Orders ED Discharge Orders          Ordered    ibuprofen (ADVIL) 800 MG tablet  3 times daily        02/22/22 1800    lidocaine (LIDODERM) 5 %  Every 24 hours        02/22/22 1800              Peter Garter, [place] 02/22/22 1801    Gerhard Munch, MD 02/22/22 [phone]

## 2022-02-22 NOTE — ED Provider Triage Note (Signed)
Emergency Medicine Provider Triage Evaluation Note  Terry Potter , a 66 y.o. female  was evaluated in triage.  Pt complains of right hip pain.  Patient states that symptoms been present for the past 6 months.  She notes 2 falls over the past 6 months with most recent being approximately 1-1/2 months ago.  Denies sensory deficits or weakness in affected right leg.  She states she been taking at home Aleve with some relief of symptoms.  Denies any new onset low back pain.  Review of Systems  Positive: See abov Negative:   Physical Exam  BP 121/67   Pulse 64   Temp 98.7 F (37.1 C) (Oral)   Resp 16   SpO2 96%  Gen:   Awake, no distress   Resp:  Normal effort  MSK:   Moves extremities without difficulty  Other:  Tenderness to palpation of right hip.  Dorsalis pedis pulses full and intact bilaterally.  Medical Decision Making  Medically screening exam initiated at 1:00 PM.  Appropriate orders placed.  Terry Potter was informed that the remainder of the evaluation will be completed by another provider, this initial triage assessment does not replace that evaluation, and the importance of remaining in the ED until their evaluation is complete.     Peter Garter, [place] 02/22/22 1301

## 2022-02-22 NOTE — ED Triage Notes (Signed)
BIBA Per EMS: Pt coming in for hip & leg pain & 2 months. Out of pain meds. Hx arthritis. Recent falls.  122/68 68HR 98% RA

## 2022-02-22 NOTE — ED Notes (Signed)
Urine and urine culture sent down to the lab.

## 2022-02-22 NOTE — Discharge Instructions (Addendum)
Note the work-up today was overall consistent with severe joint changes in the right hip.  This is most likely causing your right hip pain.  This warrants orthopedic follow-up outpatient.  In the meantime, I will prescribe ibuprofen to take every 6 hours for pain/inflammation; please do not take this medication with meloxicam or Aleve at home as they are in the same family of medicines.  You can also use Lidoderm patches over affected hip as we use the emergency department.  I recommend icing the area as well.  I will provide a couple orthopedic numbers here in the area, but if you have 1 you prefer you can call them to set up an appointment tomorrow morning.  Please do not hesitate to return to the emergency department if the worrisome signs and symptoms we discussed become apparent.

## 2022-03-03 ENCOUNTER — Ambulatory Visit: Payer: Medicare Other | Admitting: Orthopaedic Surgery

## 2022-03-04 ENCOUNTER — Other Ambulatory Visit (HOSPITAL_COMMUNITY): Payer: Self-pay | Admitting: Psychiatry

## 2022-03-11 ENCOUNTER — Ambulatory Visit
Admission: RE | Admit: 2022-03-11 | Discharge: 2022-03-11 | Disposition: A | Discharge status: Home / Self Care | Payer: Medicare Other | Source: Ambulatory Visit | Attending: Student | Admitting: Student

## 2022-03-11 ENCOUNTER — Other Ambulatory Visit: Payer: Self-pay | Admitting: Student

## 2022-03-11 DIAGNOSIS — M25512 Pain in left shoulder: Secondary | ICD-10-CM

## 2022-03-26 ENCOUNTER — Other Ambulatory Visit: Payer: Self-pay | Admitting: Student

## 2022-03-26 DIAGNOSIS — Z1231 Encounter for screening mammogram for malignant neoplasm of breast: Secondary | ICD-10-CM

## 2022-03-27 ENCOUNTER — Other Ambulatory Visit: Payer: Self-pay | Admitting: Student

## 2022-03-27 DIAGNOSIS — E2839 Other primary ovarian failure: Secondary | ICD-10-CM

## 2022-04-06 ENCOUNTER — Other Ambulatory Visit: Payer: Self-pay | Admitting: Student

## 2022-04-06 DIAGNOSIS — Z1231 Encounter for screening mammogram for malignant neoplasm of breast: Secondary | ICD-10-CM

## 2022-04-28 ENCOUNTER — Ambulatory Visit: Payer: Medicare Other | Attending: Cardiology | Admitting: Cardiology

## 2022-04-28 ENCOUNTER — Encounter: Payer: Self-pay | Admitting: Cardiology

## 2022-04-28 VITALS — BP 100/60 | HR 88 | Ht 64.0 in | Wt 131.0 lb

## 2022-04-28 DIAGNOSIS — Z01810 Encounter for preprocedural cardiovascular examination: Secondary | ICD-10-CM | POA: Diagnosis not present

## 2022-04-28 DIAGNOSIS — R9431 Abnormal electrocardiogram [ECG] [EKG]: Secondary | ICD-10-CM | POA: Diagnosis not present

## 2022-04-28 DIAGNOSIS — S72009S Fracture of unspecified part of neck of unspecified femur, sequela: Secondary | ICD-10-CM | POA: Diagnosis not present

## 2022-04-28 DIAGNOSIS — I2510 Atherosclerotic heart disease of native coronary artery without angina pectoris: Secondary | ICD-10-CM | POA: Diagnosis not present

## 2022-04-28 DIAGNOSIS — S72009A Fracture of unspecified part of neck of unspecified femur, initial encounter for closed fracture: Secondary | ICD-10-CM

## 2022-04-28 NOTE — Patient Instructions (Signed)
Medication Instructions:  Your physician recommends that you continue on your current medications as directed. Please refer to the Current Medication list given to you today.  *If you need a refill on your cardiac medications before your next appointment, please call your pharmacy*   Lab Work: NONE If you have labs (blood work) drawn today and your tests are completely normal, you will receive your results only by: MyChart Message (if you have MyChart) OR A paper copy in the mail If you have any lab test that is abnormal or we need to change your treatment, we will call you to review the results.   Testing/Procedures: Your physician has requested that you have an echocardiogram. Echocardiography is a painless test that uses sound waves to create images of your heart. It provides your doctor with information about the size and shape of your heart and how well your heart's chambers and valves are working. This procedure takes approximately one hour. There are no restrictions for this procedure. Please do NOT wear cologne, perfume, aftershave, or lotions (deodorant is allowed). Please arrive 15 minutes prior to your appointment time.  Your physician has requested that you have a lexiscan myoview. For further information please visit [email or web address] Please follow instruction sheet, as given.    You are scheduled for a Myocardial Perfusion Imaging Study. Please arrive 15 minutes prior to your appointment time for registration and insurance purposes.   The test will take approximately 3 to 4 hours to complete; you may bring reading material.  If someone comes with you to your appointment, they will need to remain in the main lobby due to limited space in the testing area. **If you are pregnant or breastfeeding, please notify the nuclear lab prior to your appointment**   How to prepare for your Myocardial Perfusion Test: Do not eat or drink 3 hours prior to your test, except you may have  water. Do not consume products containing caffeine (regular or decaffeinated) 12 hours prior to your test. (ex: coffee, chocolate, sodas, tea). Do bring a list of your current medications with you.  If not listed below, you may take your medications as normal. Do wear comfortable clothes (no dresses or overalls) and walking shoes, tennis shoes preferred (No heels or open toe shoes are allowed). Do NOT wear cologne, perfume, aftershave, or lotions (deodorant is allowed). If these instructions are not followed, your test will have to be rescheduled.  If you cannot keep your appointment, please provide 24 hours notification to the Nuclear Lab, to avoid a possible $50 charge to your account.       Follow-Up:As Needed At [facility], you and your health needs are our priority.  As part of our continuing mission to provide you with exceptional heart care, we have created designated Provider Care Teams.  These Care Teams include your primary Cardiologist (physician) and Advanced Practice Providers (APPs -  Physician Assistants and Nurse Practitioners) who all work together to provide you with the care you need, when you need it.  We recommend signing up for the patient portal called "MyChart".  Sign up information is provided on this After Visit Summary.  MyChart is used to connect with patients for Virtual Visits (Telemedicine).  Patients are able to view lab/test results, encounter notes, upcoming appointments, etc.  Non-urgent messages can be sent to your provider as well.   To learn more about what you can do with MyChart, go to [email or web address].     Important Information About Sugar

## 2022-04-28 NOTE — Progress Notes (Signed)
Cardiology Office Note:    Date:  04/28/2022   ID:  Terry Potter, DOB Jan 29, 1956, MRN [id number]  PCP:  Patient, No Pcp Per   [facility] Providers Cardiologist:  None     Referring MD: Hillery Aldo, NP   History of Present Illness:    Terry Potter is a 66 y.o. female here for the evaluation of an abnormal EKG and preoperative hip surgery, Dr. Linna Caprice at the request of Hillery Aldo, NP.   She has a history of arthritis, bipolar disorder, and substance abuse.  Was seen by Hillery Aldo, NP on 03/25/2022 for medical clearance for surgery. Abnormal EKG found during workup.  This EKG demonstrated poor R wave progression with possible anterior infarct pattern.  Smokes about 7 a day and started at age 26. Reports very little use of marijuana, last use being 2 weeks before visit. Admits to smoking crack recreational for pain and her last use was the day before her visit.   Today, she complains of chronic hip pain. She has been waiting to be cleared for her hip surgery.  She denies any fainting spells, chest pain, or knowledge of any prior cardiac diseases or problems.  She believes her mother may have a history of heart problems possibly attributed to cancer but is unsure. She described her mom taking Nitroglyceral. Other than that, she is unsure about any other family history with prior cardiac diseases.  She denies any palpitations, chest pain, shortness of breath, or peripheral edema. No lightheadedness, headaches, syncope, orthopnea, or PND.  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   ALLERGY RELIEF 10 MG tablet Take 10 mg by mouth daily.   aspirin EC 325 MG EC tablet Take 1 tablet (325 mg total) by mouth 2 (two) times daily with a meal.   budesonide-formoterol (SYMBICORT) 160-4.5 MCG/ACT inhaler Inhale 2 puffs into the lungs 2 (two) times daily.   fluticasone (FLONASE) 50 MCG/ACT nasal spray Place 1 spray into both nostrils daily as needed for rhinitis.   gabapentin (NEURONTIN) 300 MG capsule Take 1 capsule (300 mg total) by mouth 3 (three) times daily.   ibuprofen (ADVIL) 800 MG tablet Take 1 tablet (800 mg total) by mouth 3 (three) times daily.   lidocaine (LIDODERM) 5 % Place 1 patch onto the skin daily. Remove & Discard patch within 12 hours or as directed by MD   mirtazapine (REMERON) 15 MG tablet Take 1 tablet (15 mg total) by mouth at bedtime.   Oxcarbazepine (TRILEPTAL) 300 MG tablet Take 1 tablet (300 mg total) by mouth 3 (three) times daily.   prazosin (MINIPRESS) 2 MG capsule TAKE  1 CAPSULE (2 MG TOTAL) BY MOUTH AT BEDTIME.   risperiDONE (RISPERDAL) 1 MG tablet Take 1 tablet (1 mg total) by mouth 2 (two) times daily.     Allergies:   Tetracyclines & related   Social History   Socioeconomic History   Marital status: Legally Separated    Spouse name: Not on file   Number of children: Not on file   Years of education: Not on file   Highest education level: Not on file  Occupational History   Not on file  Tobacco Use   Smoking status: Every Day    Packs/day: 0.25    Types: Cigarettes, Cigars   Smokeless tobacco: Never  Substance and Sexual Activity   Alcohol use: Yes    Comment: occ   Drug use: Yes     Types: Cocaine    Comment: occ   Sexual activity: Not on file  Other Topics Concern   Not on file  Social History Narrative   Not on file   Social Determinants of Health   Financial Resource Strain: Not on file  Food Insecurity: Not on file  Transportation Needs: Not on file  Physical Activity: Not on file  Stress: Not on file  Social Connections: Not on file     Family History: The patient's family history includes Cancer in her mother.  ROS:   Please see the history of present illness.    (+) Hip pain All other systems reviewed and are negative.  EKGs/Labs/Other Studies Reviewed:    The following studies were reviewed today:  Chest X-ray 12/05/2020: FINDINGS: Cardiac shadow is within normal limits. Aortic calcifications are noted. Lungs are clear bilaterally. No bony abnormality is noted.   IMPRESSION: No active disease.   Echo 02/03/2018: Study Conclusions  - Left ventricle: The cavity size was normal. Systolic function was    normal. The estimated ejection fraction was in the range of 55%    to 60%. Wall motion was normal; there were no regional wall    motion abnormalities.    Lower Venous Study 08/30/2017: Final Interpretation:  Right: No evidence of common femoral vein obstruction.  Left: There is no evidence of deep vein thrombosis in the lower  extremity.There is no evidence of superficial venous thrombosis. No cystic  structure found in the popliteal fossa.    EKG:  EKG is personally reviewed and interpreted. 04/28/2022: Sinus rhythm 88 bpm with left axis deviation and poor R wave progression with possible anterior infarct pattern.  05/21/2020: Normal sinus rhythm. Rate 78 bpm. Left posterior fascicular block.      Recent Labs: No results found for requested labs within last 365 days.   Recent Lipid Panel No results found for: "CHOL", "TRIG", "HDL", "CHOLHDL", "VLDL", "LDLCALC", "LDLDIRECT"   Risk Assessment/Calculations:           Physical Exam:    VS:  BP 100/60 (BP Location: Left Arm, Patient Position: Sitting, Cuff Size: Normal)   Pulse 88   Ht 5\' 4"  (1.626 m)   Wt 131 lb (59.4 kg)   BMI 22.49 kg/m     Wt Readings from Last 3 Encounters:  04/28/22 131 lb (59.4 kg)  02/07/20 90 lb (40.8 kg)  02/03/18 133 lb 1.6 oz (60.4 kg)     GEN: Well nourished, well developed in no acute distress HEENT: Poor dentition NECK: No JVD; No carotid bruits LYMPHATICS: No lymphadenopathy CARDIAC:  RRR, no murmurs, rubs, gallops RESPIRATORY:  Clear to auscultation without rales, wheezing or  rhonchi  ABDOMEN: Soft, non-tender, non-distended MUSCULOSKELETAL:  No edema; No deformity  SKIN: Warm and dry NEUROLOGIC:  Alert and oriented x 3 PSYCHIATRIC:  Normal affect   ASSESSMENT:    1. Preop cardiovascular exam   2. Hip fracture, unspecified laterality, closed, initial encounter ([place])   3. Coronary artery disease involving native coronary artery of native heart without angina pectoris   4. Nonspecific abnormal electrocardiogram (ECG) (EKG)    PLAN:    In order of problems listed above:  Preop hip surgery Abnormal EKG Coronary artery disease/coronary artery calcification - We will go ahead and proceed with pharmacologic stress test given her EKG which demonstrates old anterior infarct pattern.  I want to ensure that there is no evidence of high risk ischemia present.  I personally reviewed her latest CT scan and interpreted coronary artery calcification noted in the LAD as well as the ostial RCA distribution. - We will check an echocardiogram to ensure proper structure and function as well to ensure that there is no anterior infarct. - Depending on the results of the studies we will proceed with preoperative evaluation and risk stratification.  Schizoaffective disorder - Behavioral health notes reviewed.  Polysubstance abuse - Encourage cessation.  Shared Decision Making/Informed Consent The risks [chest pain,  shortness of breath, cardiac arrhythmias, dizziness, blood pressure fluctuations, myocardial infarction, stroke/transient ischemic attack, nausea, vomiting, allergic reaction, radiation exposure, metallic taste sensation and life-threatening complications (estimated to be 1 in 10,000)], benefits (risk stratification, diagnosing coronary artery disease, treatment guidance) and alternatives of a nuclear stress test were discussed in detail with Terry Potter and she agrees to proceed.   Follow-up: We will follow-up with results of testing.  Medication Adjustments/Labs and Tests Ordered: Current medicines are reviewed at length with the patient today.  Concerns regarding medicines are outlined above.   Orders Placed This Encounter  Procedures   MYOCARDIAL PERFUSION IMAGING   EKG 12-Lead   ECHOCARDIOGRAM COMPLETE   No orders of the defined types were placed in this encounter.  Patient Instructions  Medication Instructions:  Your physician recommends that you continue on your current medications as directed. Please refer to the Current Medication list given to you today.  *If you need a refill on your cardiac medications before your next appointment, please call your pharmacy*   Lab Work: NONE If you have labs (blood work) drawn today and your tests are completely normal, you will receive your results only by: Derma (if you have MyChart) OR A paper copy in the mail If you have any lab test that is abnormal or we need to change your treatment, we will call you to review the results.   Testing/Procedures: Your physician has requested that you have an echocardiogram. Echocardiography is a painless test that uses sound waves to create images of your heart. It provides your doctor with information about the size and shape of your heart and how well your heart's chambers and valves are working. This procedure takes approximately one hour. There are no restrictions for this  procedure. Please do NOT wear cologne, perfume, aftershave, or lotions (deodorant is allowed). Please arrive 15 minutes prior to your appointment time.  Your physician has requested that you have a lexiscan myoview. For further information please visit [email or web address]. Please follow instruction sheet, as given.    You are scheduled for a Myocardial Perfusion Imaging Study. Please arrive 15 minutes prior to your appointment time for registration and insurance purposes.   The test will take approximately 3  to 4 hours to complete; you may bring reading material.  If someone comes with you to your appointment, they will need to remain in the main lobby due to limited space in the testing area. **If you are pregnant or breastfeeding, please notify the nuclear lab prior to your appointment**   How to prepare for your Myocardial Perfusion Test: Do not eat or drink 3 hours prior to your test, except you may have water. Do not consume products containing caffeine (regular or decaffeinated) 12 hours prior to your test. (ex: coffee, chocolate, sodas, tea). Do bring a list of your current medications with you.  If not listed below, you may take your medications as normal. Do wear comfortable clothes (no dresses or overalls) and walking shoes, tennis shoes preferred (No heels or open toe shoes are allowed). Do NOT wear cologne, perfume, aftershave, or lotions (deodorant is allowed). If these instructions are not followed, your test will have to be rescheduled.  If you cannot keep your appointment, please provide 24 hours notification to the Nuclear Lab, to avoid a possible $50 charge to your account.       Follow-Up:As Needed At [facility], you and your health needs are our priority.  As part of our continuing mission to provide you with exceptional heart care, we have created designated Provider Care Teams.  These Care Teams include your primary Cardiologist (physician) and Advanced  Practice Providers (APPs -  Physician Assistants and Nurse Practitioners) who all work together to provide you with the care you need, when you need it.  We recommend signing up for the patient portal called "MyChart".  Sign up information is provided on this After Visit Summary.  MyChart is used to connect with patients for Virtual Visits (Telemedicine).  Patients are able to view lab/test results, encounter notes, upcoming appointments, etc.  Non-urgent messages can be sent to your provider as well.   To learn more about what you can do with MyChart, go to [email or web address].     Important Information About Sugar         I,Rachel Rivera,acting as a scribe for Candee Furbish, MD.,have documented all relevant documentation on the behalf of Candee Furbish, MD,as directed by  Candee Furbish, MD while in the presence of Candee Furbish, MD.  I, Candee Furbish, MD, have reviewed all documentation for this visit. The documentation on 04/28/22 for the exam, diagnosis, procedures, and orders are all accurate and complete.   Signed, Candee Furbish, MD  04/28/2022 3:29 PM    [place]

## 2022-05-12 ENCOUNTER — Other Ambulatory Visit: Payer: Medicare Other

## 2022-05-12 ENCOUNTER — Inpatient Hospital Stay: Admission: RE | Admit: 2022-05-12 | Payer: Medicare Other | Source: Ambulatory Visit

## 2022-05-21 ENCOUNTER — Other Ambulatory Visit: Payer: Self-pay | Admitting: Cardiology

## 2022-05-21 DIAGNOSIS — R079 Chest pain, unspecified: Secondary | ICD-10-CM

## 2022-05-27 ENCOUNTER — Telehealth (HOSPITAL_COMMUNITY): Payer: Self-pay | Admitting: *Deleted

## 2022-05-27 NOTE — Telephone Encounter (Signed)
Unable to reach pt for MPI instructions. Tried home and mobile numbers, unable to leave message.

## 2022-05-28 ENCOUNTER — Ambulatory Visit (HOSPITAL_BASED_OUTPATIENT_CLINIC_OR_DEPARTMENT_OTHER): Payer: Medicare Other

## 2022-05-28 ENCOUNTER — Ambulatory Visit (HOSPITAL_COMMUNITY): Payer: Medicare Other | Attending: Cardiology

## 2022-05-28 DIAGNOSIS — F1210 Cannabis abuse, uncomplicated: Secondary | ICD-10-CM | POA: Diagnosis not present

## 2022-05-28 DIAGNOSIS — R9431 Abnormal electrocardiogram [ECG] [EKG]: Secondary | ICD-10-CM

## 2022-05-28 DIAGNOSIS — Z01810 Encounter for preprocedural cardiovascular examination: Secondary | ICD-10-CM | POA: Insufficient documentation

## 2022-05-28 DIAGNOSIS — I2510 Atherosclerotic heart disease of native coronary artery without angina pectoris: Secondary | ICD-10-CM

## 2022-05-28 LAB — MYOCARDIAL PERFUSION IMAGING
LV dias vol: 59 mL (ref 46–106)
LV sys vol: 18 mL
Nuc Stress EF: 69 %
Peak HR: 100 {beats}/min
Rest HR: 63 {beats}/min
Rest Nuclear Isotope Dose: 10.1 mCi
SDS: 2
SRS: 0
SSS: 2
ST Depression (mm): 0 mm
Stress Nuclear Isotope Dose: 32.1 mCi
TID: 0.66

## 2022-05-28 LAB — ECHOCARDIOGRAM COMPLETE
Area-P 1/2: 3.99 cm2
Height: 64 in
S' Lateral: 3.3 cm
Weight: 2096 oz

## 2022-05-28 MED ORDER — TECHNETIUM TC 99M TETROFOSMIN IV KIT
10.1000 | PACK | Freq: Once | INTRAVENOUS | Status: AC | PRN
  Administered 2022-05-28: 10.1 via INTRAVENOUS

## 2022-05-28 MED ORDER — REGADENOSON 0.4 MG/5ML IV SOLN
0.4000 mg | Freq: Once | INTRAVENOUS | Status: AC
  Administered 2022-05-28: 0.4 mg via INTRAVENOUS

## 2022-05-28 MED ORDER — TECHNETIUM TC 99M TETROFOSMIN IV KIT
32.1000 | PACK | Freq: Once | INTRAVENOUS | Status: AC | PRN
  Administered 2022-05-28: 32.1 via INTRAVENOUS

## 2022-05-29 ENCOUNTER — Telehealth: Payer: Self-pay | Admitting: Cardiology

## 2022-05-29 NOTE — Telephone Encounter (Signed)
Reviewed results of echo and myoview with pt who stated understanding.  All questions, if any were answered at the time of the call.

## 2022-05-29 NOTE — Telephone Encounter (Signed)
Pt is returning call in regards to results. Transferred to Rinaldo Cloud, Charity fundraiser.

## 2022-06-01 ENCOUNTER — Telehealth: Payer: Self-pay | Admitting: *Deleted

## 2022-06-01 NOTE — Telephone Encounter (Signed)
   Patient Name: Terry Potter  DOB: 07-13-1955 MRN: [id number]  Primary Cardiologist: None  Chart reviewed as part of pre-operative protocol coverage. Patient was recently seen by Dr. Anne Fu on 04/28/2022 for pre-op evaluation for upcoming hip surgery given an abnormal EKG. She denied any specific cardiac symptoms at that time. Given abnormal EKG and coronary artery calcifications noted on prior CT, Lexiscan Myoview and Echo were ordered for further evaluation.  Myoview was low risk and showed a fixed apical inferior perfusion defect consistent with artifact but no ischemia. Echo showed LVEF of 50-55% with mild akinesis of the lateral apex (1 segment and normal diastolic parameters. Per Dr. Anne Fu, patient is okay to proceed with hip surgery.  In regards to Aspirin therapy, she has Aspirin 325mg  twice daily listed under her medication list which it looks like she has been on for a long time (at least since 2018). We don't use this dosing for a cardiac reason. From a cardiac standpoint, she is okay to hold Aspirin for 5-7 days prior to surgery. However, I would also recommend reaching out to the prescribing provider.  I will route this recommendation to the requesting party via Epic fax function and remove from pre-op pool.  Please call with questions.  2019, PA-C 06/01/2022, 4:21 PM

## 2022-06-01 NOTE — Telephone Encounter (Signed)
   Pre-operative Risk Assessment    Patient Name: Terry Potter  DOB: February 01, 1956 MRN: [id number]      Request for Surgical Clearance    Procedure:   RIGHT TOTAL HIP ARTHROPLASTY  Date of Surgery:  Clearance TBD                                 Surgeon:  DR. Samson Frederic Surgeon's Group or Practice Name:  [name]  Phone number:  [phone] ATTN: KERRI MAZE Fax number:  [phone]   Type of Clearance Requested:   - Medical ; ASA 325 MG    Type of Anesthesia:  Spinal   Additional requests/questions:    Elpidio Anis   06/01/2022, 3:07 PM

## 2022-06-18 ENCOUNTER — Ambulatory Visit: Payer: Self-pay | Admitting: Student

## 2022-06-22 NOTE — Progress Notes (Signed)
COVID Vaccine Completed:  Date of COVID positive in last 90 days:  PCP - Arthur Holms, NP Cardiologist - Candee Furbish, MD  Medical clearance by Arthur Holms 06/05/22 on chart  Cardiac clearance by Sande Rives 06/01/22 in Epic   Chest x-ray -  EKG - 04/28/22 Epic Stress Test - 05/28/22 Epic ECHO - 05/28/22 Epic Cardiac Cath -  Pacemaker/ICD device last checked: Spinal Cord Stimulator:  Bowel Prep -   Sleep Study -  CPAP -   Fasting Blood Sugar -  Checks Blood Sugar _____ times a day  Last dose of GLP1 agonist-  N/A GLP1 instructions:  N/A   Last dose of SGLT-2 inhibitors-  N/A SGLT-2 instructions: N/A   Blood Thinner Instructions: Aspirin Instructions: ASA 325, hold 5-7 days Last Dose:  Activity level:  Can go up a flight of stairs and perform activities of daily living without stopping and without symptoms of chest pain or shortness of breath.  Able to exercise without symptoms  Unable to go up a flight of stairs without symptoms of     Anesthesia review: CAD, cocaine, pulmonary nodules  Patient denies shortness of breath, fever, cough and chest pain at PAT appointment  Patient verbalized understanding of instructions that were given to them at the PAT appointment. Patient was also instructed that they will need to review over the PAT instructions again at home before surgery.

## 2022-06-22 NOTE — Patient Instructions (Signed)
SURGICAL WAITING ROOM VISITATION  Patients having surgery or a procedure may have no more than 2 support people in the waiting area - these visitors may rotate.    Children under the age of 82 must have an adult with them who is not the patient.  Due to an increase in RSV and influenza rates and associated hospitalizations, children ages 72 and under may not visit patients in [place].  If the patient needs to stay at the hospital during part of their recovery, the visitor guidelines for inpatient rooms apply. Pre-op nurse will coordinate an appropriate time for 1 support person to accompany patient in pre-op.  This support person may not rotate.    Please refer to the [facility] website for the visitor guidelines for Inpatients (after your surgery is over and you are in a regular room).    Your procedure is scheduled on: 06/24/22   Report to [facility] Main Entrance    Report to admitting at 12:20 PM   Call this number if you have problems the morning of surgery [phone]   Do not eat food :After Midnight.   After Midnight you may have the following liquids until 11:50 AM DAY OF SURGERY  Water Non-Citrus Juices (without pulp, NO RED-Apple, White grape, White cranberry) Black Coffee (NO MILK/CREAM OR CREAMERS, sugar ok)  Clear Tea (NO MILK/CREAM OR CREAMERS, sugar ok) regular and decaf                             Plain Jell-O (NO RED)                                           Fruit ices (not with fruit pulp, NO RED)                                     Popsicles (NO RED)                                                               Sports drinks like Gatorade (NO RED)                 The day of surgery:  Drink ONE (1) Pre-Surgery Clear Ensure at 11:50 AM the morning of surgery. Drink in one sitting. Do not sip.  This drink was given to you during your hospital  pre-op appointment visit. Nothing else to drink after completing the  Pre-Surgery Clear  Ensure.          If you have questions, please contact your surgeon's office.   FOLLOW BOWEL PREP AND ANY ADDITIONAL PRE OP INSTRUCTIONS YOU RECEIVED FROM YOUR SURGEON'S OFFICE!!!     Oral Hygiene is also important to reduce your risk of infection.                                    Remember - BRUSH YOUR TEETH THE MORNING OF SURGERY WITH YOUR REGULAR TOOTHPASTE  DENTURES WILL  BE REMOVED PRIOR TO SURGERY PLEASE DO NOT APPLY "Poly grip" OR ADHESIVES!!!   Do NOT smoke after Midnight   Take these medicines the morning of surgery with A SIP OF WATER: Tylenol, Inhalers, Gabapentin, Risperidone, Oxcarbazepine   DO NOT TAKE ANY ORAL DIABETIC MEDICATIONS DAY OF YOUR SURGERY  Bring CPAP mask and tubing day of surgery.                              You may not have any metal on your body including hair pins, jewelry, and body piercing             Do not wear make-up, lotions, powders, perfumes, or deodorant  Do not wear nail polish including gel and S&S, artificial/acrylic nails, or any other type of covering on natural nails including finger and toenails. If you have artificial nails, gel coating, etc. that needs to be removed by a nail salon please have this removed prior to surgery or surgery may need to be canceled/ delayed if the surgeon/ anesthesia feels like they are unable to be safely monitored.   Do not shave  48 hours prior to surgery.    Do not bring valuables to the hospital. [facility] IS NOT             RESPONSIBLE   FOR VALUABLES.   Contacts, glasses, dentures or bridgework may not be worn into surgery.   Bring small overnight bag day of surgery.   DO NOT BRING YOUR HOME MEDICATIONS TO THE HOSPITAL. PHARMACY WILL DISPENSE MEDICATIONS LISTED ON YOUR MEDICATION LIST TO YOU DURING YOUR ADMISSION IN THE HOSPITAL!   Special Instructions: Bring a copy of your healthcare power of attorney and living will documents the day of surgery if you haven't scanned them before.               Please read over the following fact sheets you were given: IF YOU HAVE QUESTIONS ABOUT YOUR PRE-OP INSTRUCTIONS PLEASE CALL [name]    If you received a COVID test during your pre-op visit  it is requested that you wear a mask when out in public, stay away from anyone that may not be feeling well and notify your surgeon if you develop symptoms. If you test positive for Covid or have been in contact with anyone that has tested positive in the last 10 days please notify you surgeon.    [facility] - Preparing for Surgery Before surgery, you can play an important role.  Because skin is not sterile, your skin needs to be as free of germs as possible.  You can reduce the number of germs on your skin by washing with CHG (chlorahexidine gluconate) soap before surgery.  CHG is an antiseptic cleaner which kills germs and bonds with the skin to continue killing germs even after washing. Please DO NOT use if you have an allergy to CHG or antibacterial soaps.  If your skin becomes reddened/irritated stop using the CHG and inform your nurse when you arrive at Short Stay. Do not shave (including legs and underarms) for at least 48 hours prior to the first CHG shower.  You may shave your face/neck.  Please follow these instructions carefully:  1.  Shower with CHG Soap the night before surgery and the  morning of surgery.  2.  If you choose to wash your hair, wash your hair first as usual with your normal  shampoo.  3.  After you shampoo, rinse your hair and body thoroughly to remove the shampoo.                             4.  Use CHG as you would any other liquid soap.  You can apply chg directly to the skin and wash.  Gently with a scrungie or clean washcloth.  5.  Apply the CHG Soap to your body ONLY FROM THE NECK DOWN.   Do   not use on face/ open                           Wound or open sores. Avoid contact with eyes, ears mouth and   genitals (private parts).                       Wash face,   Genitals (private parts) with your normal soap.             6.  Wash thoroughly, paying special attention to the area where your    surgery  will be performed.  7.  Thoroughly rinse your body with warm water from the neck down.  8.  DO NOT shower/wash with your normal soap after using and rinsing off the CHG Soap.                9.  Pat yourself dry with a clean towel.            10.  Wear clean pajamas.            11.  Place clean sheets on your bed the night of your first shower and do not  sleep with pets. Day of Surgery : Do not apply any lotions/deodorants the morning of surgery.  Please wear clean clothes to the hospital/surgery center.  FAILURE TO FOLLOW THESE INSTRUCTIONS MAY RESULT IN THE CANCELLATION OF YOUR SURGERY  PATIENT SIGNATURE_________________________________  NURSE SIGNATURE__________________________________  ________________________________________________________________________  [facility]  An incentive spirometer is a tool that can help keep your lungs clear and active. This tool measures how well you are filling your lungs with each breath. Taking long deep breaths may help reverse or decrease the chance of developing breathing (pulmonary) problems (especially infection) following: A long period of time when you are unable to move or be active. BEFORE THE PROCEDURE  If the spirometer includes an indicator to show your best effort, your nurse or respiratory therapist will set it to a desired goal. If possible, sit up straight or lean slightly forward. Try not to slouch. Hold the incentive spirometer in an upright position. INSTRUCTIONS FOR USE  Sit on the edge of your bed if possible, or sit up as far as you can in bed or on a chair. Hold the incentive spirometer in an upright position. Breathe out normally. Place the mouthpiece in your mouth and seal your lips tightly around it. Breathe in slowly and as deeply as possible, raising the piston or the  ball toward the top of the column. Hold your breath for 3-5 seconds or for as long as possible. Allow the piston or ball to fall to the bottom of the column. Remove the mouthpiece from your mouth and breathe out normally. Rest for a few seconds and repeat Steps 1 through 7 at least 10 times every 1-2 hours when you are awake. Take your time and take  a few normal breaths between deep breaths. The spirometer may include an indicator to show your best effort. Use the indicator as a goal to work toward during each repetition. After each set of 10 deep breaths, practice coughing to be sure your lungs are clear. If you have an incision (the cut made at the time of surgery), support your incision when coughing by placing a pillow or rolled up towels firmly against it. Once you are able to get out of bed, walk around indoors and cough well. You may stop using the incentive spirometer when instructed by your caregiver.  RISKS AND COMPLICATIONS Take your time so you do not get dizzy or light-headed. If you are in pain, you may need to take or ask for pain medication before doing incentive spirometry. It is harder to take a deep breath if you are having pain. AFTER USE Rest and breathe slowly and easily. It can be helpful to keep track of a log of your progress. Your caregiver can provide you with a simple table to help with this. If you are using the spirometer at home, follow these instructions: SEEK MEDICAL CARE IF:  You are having difficultly using the spirometer. You have trouble using the spirometer as often as instructed. Your pain medication is not giving enough relief while using the spirometer. You develop fever of 100.5 F (38.1 C) or higher. SEEK IMMEDIATE MEDICAL CARE IF:  You cough up bloody sputum that had not been present before. You develop fever of 102 F (38.9 C) or greater. You develop worsening pain at or near the incision site. MAKE SURE YOU:  Understand these instructions. Will  watch your condition. Will get help right away if you are not doing well or get worse. Document Released: 10/12/2006 Document Revised: 08/24/2011 Document Reviewed: 12/13/2006 [employer] Patient Information 2014 [employer], [place].   ________________________________________________________________________ WHAT IS A BLOOD TRANSFUSION? Blood Transfusion Information  A transfusion is the replacement of blood or some of its parts. Blood is made up of multiple cells which provide different functions. Red blood cells carry oxygen and are used for blood loss replacement. White blood cells fight against infection. Platelets control bleeding. Plasma helps clot blood. Other blood products are available for specialized needs, such as hemophilia or other clotting disorders. BEFORE THE TRANSFUSION  Who gives blood for transfusions?  Healthy volunteers who are fully evaluated to make sure their blood is safe. This is blood bank blood. Transfusion therapy is the safest it has ever been in the practice of medicine. Before blood is taken from a donor, a complete history is taken to make sure that person has no history of diseases nor engages in risky social behavior (examples are intravenous drug use or sexual activity with multiple partners). The donor's travel history is screened to minimize risk of transmitting infections, such as malaria. The donated blood is tested for signs of infectious diseases, such as HIV and hepatitis. The blood is then tested to be sure it is compatible with you in order to minimize the chance of a transfusion reaction. If you or a relative donates blood, this is often done in anticipation of surgery and is not appropriate for emergency situations. It takes many days to process the donated blood. RISKS AND COMPLICATIONS Although transfusion therapy is very safe and saves many lives, the main dangers of transfusion include:  Getting an infectious disease. Developing a transfusion  reaction. This is an allergic reaction to something in the blood you were given. Every precaution  is taken to prevent this. The decision to have a blood transfusion has been considered carefully by your caregiver before blood is given. Blood is not given unless the benefits outweigh the risks. AFTER THE TRANSFUSION Right after receiving a blood transfusion, you will usually feel much better and more energetic. This is especially true if your red blood cells have gotten low (anemic). The transfusion raises the level of the red blood cells which carry oxygen, and this usually causes an energy increase. The nurse administering the transfusion will monitor you carefully for complications. HOME CARE INSTRUCTIONS  No special instructions are needed after a transfusion. You may find your energy is better. Speak with your caregiver about any limitations on activity for underlying diseases you may have. SEEK MEDICAL CARE IF:  Your condition is not improving after your transfusion. You develop redness or irritation at the intravenous (IV) site. SEEK IMMEDIATE MEDICAL CARE IF:  Any of the following symptoms occur over the next 12 hours: Shaking chills. You have a temperature by mouth above 102 F (38.9 C), not controlled by medicine. Chest, back, or muscle pain. People around you feel you are not acting correctly or are confused. Shortness of breath or difficulty breathing. Dizziness and fainting. You get a rash or develop hives. You have a decrease in urine output. Your urine turns a dark color or changes to pink, red, or brown. Any of the following symptoms occur over the next 10 days: You have a temperature by mouth above 102 F (38.9 C), not controlled by medicine. Shortness of breath. Weakness after normal activity. The white part of the eye turns yellow (jaundice). You have a decrease in the amount of urine or are urinating less often. Your urine turns a dark color or changes to pink, red, or  brown. Document Released: 05/29/2000 Document Revised: 08/24/2011 Document Reviewed: 01/16/2008 [facility] Patient Information 2014 [place], [place].  _______________________________________________________________________

## 2022-06-23 ENCOUNTER — Encounter (HOSPITAL_COMMUNITY): Payer: Self-pay | Admitting: Physician Assistant

## 2022-06-23 ENCOUNTER — Encounter (HOSPITAL_COMMUNITY): Payer: Self-pay | Admitting: Anesthesiology

## 2022-06-23 ENCOUNTER — Encounter (HOSPITAL_COMMUNITY)
Admission: RE | Admit: 2022-06-23 | Discharge: 2022-06-23 | Disposition: A | Discharge status: Home / Self Care | Payer: Medicare HMO | Source: Ambulatory Visit | Attending: Orthopedic Surgery | Admitting: Orthopedic Surgery

## 2022-06-23 ENCOUNTER — Ambulatory Visit: Payer: Self-pay | Admitting: Student

## 2022-06-23 ENCOUNTER — Encounter (HOSPITAL_COMMUNITY): Payer: Self-pay

## 2022-06-23 VITALS — BP 130/68 | HR 86 | Temp 98.1°F | Resp 18 | Ht 62.0 in | Wt 130.5 lb

## 2022-06-23 DIAGNOSIS — Z7982 Long term (current) use of aspirin: Secondary | ICD-10-CM | POA: Insufficient documentation

## 2022-06-23 DIAGNOSIS — Z7951 Long term (current) use of inhaled steroids: Secondary | ICD-10-CM | POA: Insufficient documentation

## 2022-06-23 DIAGNOSIS — R0602 Shortness of breath: Secondary | ICD-10-CM | POA: Insufficient documentation

## 2022-06-23 DIAGNOSIS — Z79899 Other long term (current) drug therapy: Secondary | ICD-10-CM | POA: Insufficient documentation

## 2022-06-23 DIAGNOSIS — Z01812 Encounter for preprocedural laboratory examination: Secondary | ICD-10-CM | POA: Insufficient documentation

## 2022-06-23 DIAGNOSIS — J439 Emphysema, unspecified: Secondary | ICD-10-CM | POA: Diagnosis not present

## 2022-06-23 DIAGNOSIS — M1611 Unilateral primary osteoarthritis, right hip: Secondary | ICD-10-CM | POA: Insufficient documentation

## 2022-06-23 DIAGNOSIS — F17200 Nicotine dependence, unspecified, uncomplicated: Secondary | ICD-10-CM | POA: Insufficient documentation

## 2022-06-23 DIAGNOSIS — F4321 Adjustment disorder with depressed mood: Secondary | ICD-10-CM

## 2022-06-23 DIAGNOSIS — Z01818 Encounter for other preprocedural examination: Secondary | ICD-10-CM

## 2022-06-23 DIAGNOSIS — F1420 Cocaine dependence, uncomplicated: Secondary | ICD-10-CM

## 2022-06-23 DIAGNOSIS — J45909 Unspecified asthma, uncomplicated: Secondary | ICD-10-CM | POA: Diagnosis not present

## 2022-06-23 HISTORY — DX: Unspecified asthma, uncomplicated: J45.909

## 2022-06-23 LAB — COMPREHENSIVE METABOLIC PANEL
ALT: 18 U/L (ref 0–44)
AST: 19 U/L (ref 15–41)
Albumin: 3 g/dL — ABNORMAL LOW (ref 3.5–5.0)
Alkaline Phosphatase: 70 U/L (ref 38–126)
Anion gap: 9 (ref 5–15)
BUN: 29 mg/dL — ABNORMAL HIGH (ref 8–23)
CO2: 25 mmol/L (ref 22–32)
Calcium: 8.5 mg/dL — ABNORMAL LOW (ref 8.9–10.3)
Chloride: 107 mmol/L (ref 98–111)
Creatinine, Ser: 0.71 mg/dL (ref 0.44–1.00)
GFR, Estimated: >60 mL/min (ref >60)
Glucose, Bld: 96 mg/dL (ref 70–99)
Potassium: 3.8 mmol/L (ref 3.5–5.1)
Sodium: 141 mmol/L (ref 135–145)
Total Bilirubin: 0.3 mg/dL (ref 0.3–1.2)
Total Protein: 6.7 g/dL (ref 6.5–8.1)

## 2022-06-23 LAB — CBC
HCT: 39.8 % (ref 36.0–46.0)
Hemoglobin: 13.3 g/dL (ref 12.0–15.0)
MCH: 32.6 pg (ref 26.0–34.0)
MCHC: 33.4 g/dL (ref 30.0–36.0)
MCV: 97.5 fL (ref 80.0–100.0)
Platelets: 202 10*3/uL (ref 150–400)
RBC: 4.08 MIL/uL (ref 3.87–5.11)
RDW: 15.3 % (ref 11.5–15.5)
WBC: 5.4 10*3/uL (ref 4.0–10.5)
nRBC: 0 % (ref 0.0–0.2)

## 2022-06-23 LAB — SURGICAL PCR SCREEN
MRSA, PCR: NEGATIVE
Staphylococcus aureus: NEGATIVE

## 2022-06-23 NOTE — H&P (Signed)
TOTAL HIP ADMISSION H&P  Patient is admitted for right total hip arthroplasty.  Subjective:  Chief Complaint: right hip pain  HPI: Terry Potter, 67 y.o. female, has a history of pain and functional disability in the right hip(s) due to arthritis and patient has failed non-surgical conservative treatments for greater than 12 weeks to include NSAID's and/or analgesics, corticosteriod injections, flexibility and strengthening excercises, use of assistive devices, and activity modification.  Onset of symptoms was gradual starting 10 years ago with rapidlly worsening course since that time.The patient noted no past surgery on the right hip(s).  Patient currently rates pain in the right hip at 10 out of 10 with activity. Patient has night pain, worsening of pain with activity and weight bearing, trendelenberg gait, pain that interfers with activities of daily living, and pain with passive range of motion. Patient has evidence of subchondral cysts, subchondral sclerosis, periarticular osteophytes, and joint space narrowing by imaging studies. This condition presents safety issues increasing the risk of falls. There is no current active infection.  Patient Active Problem List   Diagnosis Date Noted   Grief 01/23/2021   PTSD (post-traumatic stress disorder) 08/13/2020   Cocaine use disorder, moderate, in controlled environment ([place]) 07/23/2020   Cocaine abuse with cocaine-induced mood disorder ([place]) 07/23/2020   Bacterial vaginosis 02/01/2018   Cocaine abuse ([place]) 02/01/2018   Pulmonary nodules 02/01/2018   Syncope 02/01/2018   Hip fracture, unspecified laterality, closed, initial encounter ([place]) 01/01/2017   Hip fracture ([place]) 01/01/2017   Displaced fracture of left femoral neck ([place]) 01/01/2017   Schizoaffective disorder, bipolar type ([place]) 03/19/2015   Past Medical History:  Diagnosis Date   Abnormal EKG    Arthritis    Asthma    Bipolar disorder ([place])    Cocaine abuse ([place])     Ectopic pregnancy    Hip pain    Marijuana use    Protein-calorie malnutrition, severe ([facility])    Shoulder pain    Tobacco dependence     Past Surgical History:  Procedure Laterality Date   ABDOMINAL SURGERY     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   Procedure: TOTAL HIP ARTHROPLASTY ANTERIOR APPROACH;  Surgeon: Rod Can, MD;  Location: [facility];  Service: Orthopedics;  Laterality: Left;   TUMOR REMOVAL     Uterine tumor removed    Current Outpatient Medications  Medication Sig Dispense Refill Last Dose   acetaminophen (TYLENOL) 500 MG tablet Take 1,000 mg by mouth every 6 (six) hours as needed for moderate pain.      albuterol (PROAIR HFA) 108 (90 Base) MCG/ACT inhaler Inhale 2 puffs into the lungs every 6 (six) hours as needed for wheezing or shortness of breath. 6.7 g 0    ALLERGY RELIEF 10 MG tablet Take 10 mg by mouth daily.      aspirin EC 325 MG EC tablet Take 1 tablet (325 mg total) by mouth 2 (two) times daily with a meal. 30 tablet 0    budesonide-formoterol (SYMBICORT) 160-4.5 MCG/ACT inhaler Inhale 2 puffs into the lungs 2 (two) times daily.      fluticasone (FLONASE) 50 MCG/ACT nasal spray Place 1 spray into both nostrils daily as needed for rhinitis.      gabapentin (NEURONTIN) 300 MG capsule Take 1 capsule (300 mg total) by mouth 3 (three) times daily. 90 capsule 3    ibuprofen (ADVIL) 800 MG tablet Take 1 tablet (800 mg total) by mouth 3 (three) times daily. 21 tablet 0  lidocaine (LIDODERM) 5 % Place 1 patch onto the skin daily. Remove & Discard patch within 12 hours or as directed by MD (Patient not taking: Reported on 06/19/2022) 30 patch 0    mirtazapine (REMERON) 15 MG tablet Take 1 tablet (15 mg total) by mouth at bedtime. 30 tablet 3    Oxcarbazepine (TRILEPTAL) 300 MG tablet Take 1 tablet (300 mg total) by mouth 3 (three) times daily. 90 tablet 3    prazosin (MINIPRESS) 2 MG capsule TAKE 1 CAPSULE (2 MG TOTAL) BY MOUTH AT BEDTIME. (Patient not taking: Reported  on 06/19/2022) 30 capsule 3    risperiDONE (RISPERDAL) 1 MG tablet Take 1 tablet (1 mg total) by mouth 2 (two) times daily. 60 tablet 3    No current facility-administered medications for this visit.   Allergies  Allergen Reactions   Tetracyclines & Related Hives    Social History   Tobacco Use   Smoking status: Every Day    Packs/day: 0.25    Types: Cigarettes, Cigars   Smokeless tobacco: Never  Substance Use Topics   Alcohol use: Yes    Alcohol/week: 2.0 standard drinks of alcohol    Types: 2 Shots of liquor per week    Comment: occ    Family History  Problem Relation Age of Onset   Cancer Mother      Review of Systems  Musculoskeletal:  Positive for arthralgias and gait problem.  All other systems reviewed and are negative.   Objective:  Physical Exam Constitutional:      Appearance: Normal appearance.  HENT:     Head: Normocephalic and atraumatic.     Nose: Nose normal.     Mouth/Throat:     Mouth: Mucous membranes are moist.     Pharynx: Oropharynx is clear.  Eyes:     Conjunctiva/sclera: Conjunctivae normal.  Cardiovascular:     Rate and Rhythm: Normal rate and regular rhythm.     Pulses: Normal pulses.     Heart sounds: Normal heart sounds.  Pulmonary:     Effort: Pulmonary effort is normal.     Breath sounds: Normal breath sounds.  Abdominal:     General: Abdomen is flat.     Palpations: Abdomen is soft.  Genitourinary:    Comments: deferred Musculoskeletal:     Cervical back: Normal range of motion and neck supple.     Comments: Examination of the right hip reveals no skin wounds or lesions. Mild trochanteric tenderness to palpation. There is severely restricted range of motion of right hip. Pain in the position of impingement. Pain with terminal flexion rotation.   Neurovascular intact distally.  Ambulates with an antalgic gait.  Skin:    General: Skin is warm and dry.     Capillary Refill: Capillary refill takes less than 2 seconds.   Neurological:     General: No focal deficit present.     Mental Status: She is alert and oriented to person, place, and time.  Psychiatric:        Mood and Affect: Mood normal.        Behavior: Behavior normal.        Thought Content: Thought content normal.        Judgment: Judgment normal.     Vital signs in last 24 hours: @VSRANGES @  Labs:   Estimated body mass index is 23.87 kg/m as calculated from the following:   Height as of an earlier encounter on 06/23/22: 5\' 2"  (1.575 m).   Weight  as of an earlier encounter on 06/23/22: 59.2 kg.   Imaging Review Plain radiographs demonstrate severe degenerative joint disease of the right hip(s). The bone quality appears to be adequate for age and reported activity level.      Assessment/Plan:  End stage arthritis, right hip(s)  The patient history, physical examination, clinical judgement of the provider and imaging studies are consistent with end stage degenerative joint disease of the right hip(s) and total hip arthroplasty is deemed medically necessary. The treatment options including medical management, injection therapy, arthroscopy and arthroplasty were discussed at length. The risks and benefits of total hip arthroplasty were presented and reviewed. The risks due to aseptic loosening, infection, stiffness, dislocation/subluxation,  thromboembolic complications and other imponderables were discussed.  The patient acknowledged the explanation, agreed to proceed with the plan and consent was signed. Patient is being admitted for inpatient treatment for surgery, pain control, PT, OT, prophylactic antibiotics, VTE prophylaxis, progressive ambulation and ADL's and discharge planning.The patient is planning to be discharged home with HEP after an overnight stay.   Therapy Plans: HEP.  Disposition: Home with  Montize Turner, Nurse.  Planned DVT Prophylaxis: aspirin 325mg BID DME needed: 3-n-1 and rolling walker.  PCP: Cleared.   Cardiology: Cleared.  TXA: IV Allergies:  - Tetracycline - skin blistering.  Anesthesia Concerns: None.  BMI: 24.8 Last HgbA1c: 5.5 Other: - COPD**  - Aspirin 325mg BID.  - Hydrocodone, zofran.  - 05/05/22: Cr. 0.73, Hgb 13.8. - 06/23/22: Albumin 3.0**      Patient's anticipated LOS is less than 2 midnights, meeting these requirements: - Younger than 65 - Lives within 1 hour of care - Has a competent adult at home to recover with post-op recover - NO history of  - Chronic pain requiring opiods  - Diabetes  - Coronary Artery Disease  - Heart failure  - Heart attack  - Stroke  - DVT/VTE  - Cardiac arrhythmia  - Respiratory Failure/COPD  - Renal failure  - Anemia  - Advanced Liver disease   

## 2022-06-23 NOTE — H&P (View-Only) (Signed)
TOTAL HIP ADMISSION H&P  Patient is admitted for right total hip arthroplasty.  Subjective:  Chief Complaint: right hip pain  HPI: Terry Potter, 67 y.o. female, has a history of pain and functional disability in the right hip(s) due to arthritis and patient has failed non-surgical conservative treatments for greater than 12 weeks to include NSAID's and/or analgesics, corticosteriod injections, flexibility and strengthening excercises, use of assistive devices, and activity modification.  Onset of symptoms was gradual starting 10 years ago with rapidlly worsening course since that time.The patient noted no past surgery on the right hip(s).  Patient currently rates pain in the right hip at 10 out of 10 with activity. Patient has night pain, worsening of pain with activity and weight bearing, trendelenberg gait, pain that interfers with activities of daily living, and pain with passive range of motion. Patient has evidence of subchondral cysts, subchondral sclerosis, periarticular osteophytes, and joint space narrowing by imaging studies. This condition presents safety issues increasing the risk of falls. There is no current active infection.  Patient Active Problem List   Diagnosis Date Noted   Grief 01/23/2021   PTSD (post-traumatic stress disorder) 08/13/2020   Cocaine use disorder, moderate, in controlled environment ([place]) 07/23/2020   Cocaine abuse with cocaine-induced mood disorder ([place]) 07/23/2020   Bacterial vaginosis 02/01/2018   Cocaine abuse ([place]) 02/01/2018   Pulmonary nodules 02/01/2018   Syncope 02/01/2018   Hip fracture, unspecified laterality, closed, initial encounter ([place]) 01/01/2017   Hip fracture ([place]) 01/01/2017   Displaced fracture of left femoral neck ([place]) 01/01/2017   Schizoaffective disorder, bipolar type ([place]) 03/19/2015   Past Medical History:  Diagnosis Date   Abnormal EKG    Arthritis    Asthma    Bipolar disorder ([place])    Cocaine abuse ([place])     Ectopic pregnancy    Hip pain    Marijuana use    Protein-calorie malnutrition, severe (H[facility])    Shoulder pain    Tobacco dependence     Past Surgical History:  Procedure Laterality Date   ABDOMINAL SURGERY     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   Procedure: TOTAL HIP ARTHROPLASTY ANTERIOR APPROACH;  Surgeon: Rod Can, MD;  Location: [facility];  Service: Orthopedics;  Laterality: Left;   TUMOR REMOVAL     Uterine tumor removed    Current Outpatient Medications  Medication Sig Dispense Refill Last Dose   acetaminophen (TYLENOL) 500 MG tablet Take 1,000 mg by mouth every 6 (six) hours as needed for moderate pain.      albuterol (PROAIR HFA) 108 (90 Base) MCG/ACT inhaler Inhale 2 puffs into the lungs every 6 (six) hours as needed for wheezing or shortness of breath. 6.7 g 0    ALLERGY RELIEF 10 MG tablet Take 10 mg by mouth daily.      aspirin EC 325 MG EC tablet Take 1 tablet (325 mg total) by mouth 2 (two) times daily with a meal. 30 tablet 0    budesonide-formoterol (SYMBICORT) 160-4.5 MCG/ACT inhaler Inhale 2 puffs into the lungs 2 (two) times daily.      fluticasone (FLONASE) 50 MCG/ACT nasal spray Place 1 spray into both nostrils daily as needed for rhinitis.      gabapentin (NEURONTIN) 300 MG capsule Take 1 capsule (300 mg total) by mouth 3 (three) times daily. 90 capsule 3    ibuprofen (ADVIL) 800 MG tablet Take 1 tablet (800 mg total) by mouth 3 (three) times daily. 21 tablet 0  lidocaine (LIDODERM) 5 % Place 1 patch onto the skin daily. Remove & Discard patch within 12 hours or as directed by MD (Patient not taking: Reported on 06/19/2022) 30 patch 0    mirtazapine (REMERON) 15 MG tablet Take 1 tablet (15 mg total) by mouth at bedtime. 30 tablet 3    Oxcarbazepine (TRILEPTAL) 300 MG tablet Take 1 tablet (300 mg total) by mouth 3 (three) times daily. 90 tablet 3    prazosin (MINIPRESS) 2 MG capsule TAKE 1 CAPSULE (2 MG TOTAL) BY MOUTH AT BEDTIME. (Patient not taking: Reported  on 06/19/2022) 30 capsule 3    risperiDONE (RISPERDAL) 1 MG tablet Take 1 tablet (1 mg total) by mouth 2 (two) times daily. 60 tablet 3    No current facility-administered medications for this visit.   Allergies  Allergen Reactions   Tetracyclines & Related Hives    Social History   Tobacco Use   Smoking status: Every Day    Packs/day: 0.25    Types: Cigarettes, Cigars   Smokeless tobacco: Never  Substance Use Topics   Alcohol use: Yes    Alcohol/week: 2.0 standard drinks of alcohol    Types: 2 Shots of liquor per week    Comment: occ    Family History  Problem Relation Age of Onset   Cancer Mother      Review of Systems  Musculoskeletal:  Positive for arthralgias and gait problem.  All other systems reviewed and are negative.   Objective:  Physical Exam Constitutional:      Appearance: Normal appearance.  HENT:     Head: Normocephalic and atraumatic.     Nose: Nose normal.     Mouth/Throat:     Mouth: Mucous membranes are moist.     Pharynx: Oropharynx is clear.  Eyes:     Conjunctiva/sclera: Conjunctivae normal.  Cardiovascular:     Rate and Rhythm: Normal rate and regular rhythm.     Pulses: Normal pulses.     Heart sounds: Normal heart sounds.  Pulmonary:     Effort: Pulmonary effort is normal.     Breath sounds: Normal breath sounds.  Abdominal:     General: Abdomen is flat.     Palpations: Abdomen is soft.  Genitourinary:    Comments: deferred Musculoskeletal:     Cervical back: Normal range of motion and neck supple.     Comments: Examination of the right hip reveals no skin wounds or lesions. Mild trochanteric tenderness to palpation. There is severely restricted range of motion of right hip. Pain in the position of impingement. Pain with terminal flexion rotation.   Neurovascular intact distally.  Ambulates with an antalgic gait.  Skin:    General: Skin is warm and dry.     Capillary Refill: Capillary refill takes less than 2 seconds.   Neurological:     General: No focal deficit present.     Mental Status: She is alert and oriented to person, place, and time.  Psychiatric:        Mood and Affect: Mood normal.        Behavior: Behavior normal.        Thought Content: Thought content normal.        Judgment: Judgment normal.     Vital signs in last 24 hours: @VSRANGES @  Labs:   Estimated body mass index is 23.87 kg/m as calculated from the following:   Height as of an earlier encounter on 06/23/22: 5\' 2"  (1.575 m).   Weight  as of an earlier encounter on 06/23/22: 59.2 kg.   Imaging Review Plain radiographs demonstrate severe degenerative joint disease of the right hip(s). The bone quality appears to be adequate for age and reported activity level.      Assessment/Plan:  End stage arthritis, right hip(s)  The patient history, physical examination, clinical judgement of the provider and imaging studies are consistent with end stage degenerative joint disease of the right hip(s) and total hip arthroplasty is deemed medically necessary. The treatment options including medical management, injection therapy, arthroscopy and arthroplasty were discussed at length. The risks and benefits of total hip arthroplasty were presented and reviewed. The risks due to aseptic loosening, infection, stiffness, dislocation/subluxation,  thromboembolic complications and other imponderables were discussed.  The patient acknowledged the explanation, agreed to proceed with the plan and consent was signed. Patient is being admitted for inpatient treatment for surgery, pain control, PT, OT, prophylactic antibiotics, VTE prophylaxis, progressive ambulation and ADL's and discharge planning.The patient is planning to be discharged home with HEP after an overnight stay.   Therapy Plans: HEP.  Disposition: Home with  Renata Caprice, Nurse.  Planned DVT Prophylaxis: aspirin 325mg  BID DME needed: 3-n-1 and rolling walker.  PCP: Cleared.   Cardiology: Cleared.  TXA: IV Allergies:  - Tetracycline - skin blistering.  Anesthesia Concerns: None.  BMI: 24.8 Last HgbA1c: 5.5 Other: - COPD**  - Aspirin 325mg  BID.  - Hydrocodone, zofran.  - 05/05/22: Cr. 0.73, Hgb 13.8. - 06/23/22: Albumin 3.0**      Patient's anticipated LOS is less than 2 midnights, meeting these requirements: - Younger than 48 - Lives within 1 hour of care - Has a competent adult at home to recover with post-op recover - NO history of  - Chronic pain requiring opiods  - Diabetes  - Coronary Artery Disease  - Heart failure  - Heart attack  - Stroke  - DVT/VTE  - Cardiac arrhythmia  - Respiratory Failure/COPD  - Renal failure  - Anemia  - Advanced Liver disease

## 2022-06-23 NOTE — Progress Notes (Signed)
Anesthesia Chart Review   Case: [id number] Date/Time: 06/24/22 1433   Procedure: TOTAL HIP ARTHROPLASTY ANTERIOR APPROACH (Right: Hip) - 150   Anesthesia type: Spinal   Pre-op diagnosis: Right hip osteoarthritits   Location: [facility] ROOM 07 / [facility]   Surgeons: Rod Can, MD       DISCUSSION:67 y.o. smoker with h/o asthma, emphysema, right hip OA scheduled for above procedure 06/24/2022 with Dr. Rod Can.   Per cardiology preoperative evalauation 06/01/2022, "Chart reviewed as part of pre-operative protocol coverage. Patient was recently seen by Dr. Marlou Porch on 04/28/2022 for pre-op evaluation for upcoming hip surgery given an abnormal EKG. She denied any specific cardiac symptoms at that time. Given abnormal EKG and coronary artery calcifications noted on prior CT, Lexiscan Myoview and Echo were ordered for further evaluation.  Myoview was low risk and showed a fixed apical inferior perfusion defect consistent with artifact but no ischemia. Echo showed LVEF of 50-55% with mild akinesis of the lateral apex (1 segment and normal diastolic parameters. Per Dr. Marlou Porch, patient is okay to proceed with hip surgery.   In regards to Aspirin therapy, she has Aspirin 325mg  twice daily listed under her medication list which it looks like she has been on for a long time (at least since 2018). We don't use this dosing for a cardiac reason. From a cardiac standpoint, she is okay to hold Aspirin for 5-7 days prior to surgery. However, I would also recommend reaching out to the prescribing provider."  COPD exacerbation in November.  Follows with PCP.  Pt reports sx stable at this time.  She has shortness of breath with activity at baseline.  Cleared by PCP with moderate risk.  Evaluate DOS.  VS: BP 130/68   Pulse 86   Temp 36.7 C (Oral)   Resp 18   Ht 5\' 2"  (1.575 m)   Wt 59.2 kg   SpO2 100%   BMI 23.87 kg/m   PROVIDERS: Arthur Holms, NP   LABS: Labs reviewed: Acceptable for surgery. (all labs  ordered are listed, but only abnormal results are displayed)  Labs Reviewed  COMPREHENSIVE METABOLIC PANEL - Abnormal; Notable for the following components:      Result Value   BUN 29 (*)    Calcium 8.5 (*)    Albumin 3.0 (*)    All other components within normal limits  SURGICAL PCR SCREEN  CBC  TYPE AND SCREEN     IMAGES:   EKG:   CV: Myocardial Perfusion 05/28/2022   The study is normal. The study is low risk.   No ST deviation was noted.   LV perfusion is normal. There is no evidence of ischemia. There is no evidence of infarction.   Left ventricular function is normal. Nuclear stress EF: 69 %. The left ventricular ejection fraction is hyperdynamic (>65%). End diastolic cavity size is normal. End systolic cavity size is normal.   Prior study not available for comparison.   Fixed apical inferior perfusion defect with normal wall motion, consistent with artifact Low risk study  Echo 05/28/2022 1. Left ventricular ejection fraction, by estimation, is 50 to 55%. Left  ventricular ejection fraction by 3D volume is 53 %. The left ventricle has  low normal function. The left ventricle demonstrates regional wall motion  abnormalities - mild  hypokinesis of the lateral apex (1 segment). Left ventricular diastolic  parameters were normal. The average left ventricular global longitudinal  strain is -20.4 %. The global longitudinal strain is normal.  2. Normal right ventricular free wall strain, -27%. Right ventricular  systolic function is normal. The right ventricular size is normal.  Tricuspid regurgitation signal is inadequate for assessing PA pressure.   3. The mitral valve is normal in structure. Trivial mitral valve  regurgitation. No evidence of mitral stenosis.   4. The aortic valve is tricuspid. There is mild thickening of the aortic  valve. Aortic valve regurgitation is not visualized. No aortic stenosis is  present.   5. The inferior vena cava is normal in size  with <50% respiratory  variability, suggesting right atrial pressure of 8 mmHg.  Past Medical History:  Diagnosis Date   Abnormal EKG    Arthritis    Asthma    Bipolar disorder (HCC)    Cocaine abuse (HCC)    Ectopic pregnancy    Hip pain    Marijuana use    Protein-calorie malnutrition, severe (HCC)    Shoulder pain    Tobacco dependence     Past Surgical History:  Procedure Laterality Date   ABDOMINAL SURGERY     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   Procedure: TOTAL HIP ARTHROPLASTY ANTERIOR APPROACH;  Surgeon: Samson Frederic, MD;  Location[facility];  Service: Orthopedics;  Laterality: Left;   TUMOR REMOVAL     Uterine tumor removed    MEDICATIONS:  acetaminophen (TYLENOL) 500 MG tablet   albuterol (PROAIR HFA) 108 (90 Base) MCG/ACT inhaler   ALLERGY RELIEF 10 MG tablet   aspirin EC 325 MG EC tablet   budesonide-formoterol (SYMBICORT) 160-4.5 MCG/ACT inhaler   fluticasone (FLONASE) 50 MCG/ACT nasal spray   gabapentin (NEURONTIN) 300 MG capsule   ibuprofen (ADVIL) 800 MG tablet   lidocaine (LIDODERM) 5 %   mirtazapine (REMERON) 15 MG tablet   Oxcarbazepine (TRILEPTAL) 300 MG tablet   prazosin (MINIPRESS) 2 MG capsule   risperiDONE (RISPERDAL) 1 MG tablet   No current facility-administered medications for this encounter.    Jodell Cipro Ward, PA-C [facility] [phone]

## 2022-06-23 NOTE — Anesthesia Preprocedure Evaluation (Deleted)
Anesthesia Evaluation  Patient identified by MRN, date of birth, ID band Patient awake    Reviewed: Allergy & Precautions, NPO status , Patient's Chart, lab work & pertinent test results  Airway Mallampati: II  TM Distance: >3 FB Neck ROM: Full    Dental no notable dental hx. (+) Missing, Poor Dentition, Chipped,    Pulmonary asthma , COPD,  COPD inhaler, Current Smoker and Patient abstained from smoking.   Pulmonary exam normal breath sounds clear to auscultation       Cardiovascular Normal cardiovascular exam Rhythm:Regular Rate:Normal  Myocardial Perfusion 05/28/2022   The study is normal. The study is low risk.   No ST deviation was noted.   LV perfusion is normal. There is no evidence of ischemia. There is no evidence of infarction.   Left ventricular function is normal. Nuclear stress EF: 69 %. The left ventricular ejection fraction is hyperdynamic (>65%). End diastolic cavity size is normal. End systolic cavity size is normal.   Prior study not available for comparison.   1. Fixed apical inferior perfusion defect with normal wall motion, consistent with artifact 2. Low risk study      Neuro/Psych  PSYCHIATRIC DISORDERS Anxiety  Bipolar Disorder Schizophrenia     GI/Hepatic ,,,(+)     substance abuse  cocaine useDrugs of Abuse         Component                Value               Date/Time                 LABOPIA                  NONE DETECTED       06/24/2022 1221           COCAINSCRNUR             POSITIVE (A)        06/24/2022 1221           LABBENZ                  NONE DETECTED       06/24/2022 1221           AMPHETMU                 NONE DETECTED       06/24/2022 1221           THCU                     NONE DETECTED       06/24/2022 1221           LABBARB                  NONE DETECTED       06/24/2022 1221          Endo/Other    Renal/GU Lab Results      Component                Value                Date                      CREATININE               0.71  06/23/2022                BUN                      29 (H)              06/23/2022                NA                       141                 06/23/2022                K                        3.8                 06/23/2022                CL                       107                 06/23/2022                CO2                      25                  06/23/2022                Musculoskeletal  (+) Arthritis ,    Abdominal   Peds  Hematology Lab Results      Component                Value               Date                      WBC                      5.4                 06/23/2022                HGB                      13.3                06/23/2022                HCT                      39.8                06/23/2022                MCV                      97.5                06/23/2022                PLT  202                 06/23/2022              Anesthesia Other Findings All: tetracycline  Reproductive/Obstetrics                             Anesthesia Physical Anesthesia Plan  ASA: 3  Anesthesia Plan: Spinal   Post-op Pain Management: Regional block* and Minimal or no pain anticipated   Induction:   PONV Risk Score and Plan: 2 and Treatment may vary due to age or medical condition, Midazolam, Ondansetron and Dexamethasone  Airway Management Planned: Nasal Cannula and Natural Airway  Additional Equipment: None  Intra-op Plan:   Post-operative Plan:   Informed Consent:      Dental advisory given  Plan Discussed with:   Anesthesia Plan Comments: (See PAT note 06/23/2022  Spinal  Dr Delfino Lovett has cancelled case as Patient is + cocaine)       Anesthesia Quick Evaluation

## 2022-06-24 ENCOUNTER — Other Ambulatory Visit: Payer: Self-pay

## 2022-06-24 ENCOUNTER — Encounter (HOSPITAL_COMMUNITY)
Admission: RE | Disposition: A | Discharge status: Home / Self Care | Payer: Self-pay | Source: Ambulatory Visit | Attending: Orthopedic Surgery

## 2022-06-24 ENCOUNTER — Ambulatory Visit (HOSPITAL_COMMUNITY)
Admission: RE | Admit: 2022-06-24 | Discharge: 2022-06-24 | Disposition: A | Discharge status: Home / Self Care | Payer: Medicare HMO | Source: Ambulatory Visit | Attending: Orthopedic Surgery | Admitting: Orthopedic Surgery

## 2022-06-24 ENCOUNTER — Encounter (HOSPITAL_COMMUNITY): Payer: Self-pay | Admitting: Orthopedic Surgery

## 2022-06-24 DIAGNOSIS — M1611 Unilateral primary osteoarthritis, right hip: Secondary | ICD-10-CM | POA: Insufficient documentation

## 2022-06-24 DIAGNOSIS — F1420 Cocaine dependence, uncomplicated: Secondary | ICD-10-CM

## 2022-06-24 DIAGNOSIS — R825 Elevated urine levels of drugs, medicaments and biological substances: Secondary | ICD-10-CM | POA: Diagnosis not present

## 2022-06-24 DIAGNOSIS — Z01818 Encounter for other preprocedural examination: Secondary | ICD-10-CM

## 2022-06-24 DIAGNOSIS — Z5309 Procedure and treatment not carried out because of other contraindication: Secondary | ICD-10-CM | POA: Diagnosis not present

## 2022-06-24 LAB — RAPID URINE DRUG SCREEN, HOSP PERFORMED
Amphetamines: NOT DETECTED
Barbiturates: NOT DETECTED
Benzodiazepines: NOT DETECTED
Cocaine: POSITIVE — AB
Opiates: NOT DETECTED
Tetrahydrocannabinol: NOT DETECTED

## 2022-06-24 LAB — TYPE AND SCREEN
ABO/RH(D): A POS
Antibody Screen: NEGATIVE

## 2022-06-24 LAB — ABO/RH: ABO/RH(D): A POS

## 2022-06-24 SURGERY — ARTHROPLASTY, HIP, TOTAL, ANTERIOR APPROACH
Anesthesia: Spinal

## 2022-06-24 MED ORDER — ORAL CARE MOUTH RINSE: 15.0000 mL | Freq: Once | OROMUCOSAL | Status: AC

## 2022-06-24 MED ORDER — LACTATED RINGERS IV SOLN: INTRAVENOUS | Status: DC

## 2022-06-24 MED ORDER — CEFAZOLIN SODIUM-DEXTROSE 2-4 GM/100ML-% IV SOLN
2.0000 g | INTRAVENOUS | Status: DC
  Filled 2022-06-24: qty 100

## 2022-06-24 MED ORDER — ACETAMINOPHEN 500 MG PO TABS: 1000.0000 mg | ORAL_TABLET | Freq: Once | ORAL | Status: DC

## 2022-06-24 MED ORDER — CHLORHEXIDINE GLUCONATE 0.12 % MT SOLN
15.0000 mL | Freq: Once | OROMUCOSAL | Status: AC
  Administered 2022-06-24: 15 mL via OROMUCOSAL

## 2022-06-24 MED ORDER — POVIDONE-IODINE 10 % EX SWAB: 2.0000 | Freq: Once | CUTANEOUS | Status: DC

## 2022-06-24 MED ORDER — TRANEXAMIC ACID-NACL 1000-0.7 MG/100ML-% IV SOLN
1000.0000 mg | INTRAVENOUS | Status: DC
  Filled 2022-06-24: qty 100

## 2022-06-24 MED ORDER — POVIDONE-IODINE 10 % EX SWAB
2.0000 | Freq: Once | CUTANEOUS | Status: AC
  Administered 2022-06-24: 2 via TOPICAL

## 2022-06-24 SURGICAL SUPPLY — 53 items
ADH SKN CLS APL DERMABOND .7 (GAUZE/BANDAGES/DRESSINGS) ×2
APL PRP STRL LF DISP 70% ISPRP (MISCELLANEOUS) ×1
BAG COUNTER SPONGE SURGICOUNT (BAG) IMPLANT
BAG DECANTER FOR FLEXI CONT (MISCELLANEOUS) IMPLANT
BAG SPEC THK2 15X12 ZIP CLS (MISCELLANEOUS)
BAG SPNG CNTER NS LX DISP (BAG)
BAG ZIPLOCK 12X15 (MISCELLANEOUS) IMPLANT
CHLORAPREP W/TINT 26 (MISCELLANEOUS) ×2 IMPLANT
COVER PERINEAL POST (MISCELLANEOUS) ×2 IMPLANT
COVER SURGICAL LIGHT HANDLE (MISCELLANEOUS) ×2 IMPLANT
DERMABOND ADVANCED .7 DNX12 (GAUZE/BANDAGES/DRESSINGS) ×4 IMPLANT
DRAPE IMP U-DRAPE 54X76 (DRAPES) ×2 IMPLANT
DRAPE SHEET LG 3/4 BI-LAMINATE (DRAPES) ×6 IMPLANT
DRAPE STERI IOBAN 125X83 (DRAPES) ×2 IMPLANT
DRAPE U-SHAPE 47X51 STRL (DRAPES) ×4 IMPLANT
DRSG AQUACEL AG ADV 3.5X10 (GAUZE/BANDAGES/DRESSINGS) ×2 IMPLANT
ELECT REM PT RETURN 15FT ADLT (MISCELLANEOUS) ×2 IMPLANT
GAUZE SPONGE 4X4 12PLY STRL (GAUZE/BANDAGES/DRESSINGS) ×2 IMPLANT
GLOVE BIO SURGEON STRL SZ7 (GLOVE) ×2 IMPLANT
GLOVE BIO SURGEON STRL SZ8.5 (GLOVE) ×4 IMPLANT
GLOVE BIOGEL M 7.0 STRL (GLOVE) ×1
GLOVE BIOGEL PI IND STRL 7.5 (GLOVE) ×2 IMPLANT
GLOVE BIOGEL PI IND STRL 8.5 (GLOVE) ×2 IMPLANT
GOWN SPEC L3 XXLG W/TWL (GOWN DISPOSABLE) ×2 IMPLANT
GOWN STRL REUS W/ TWL XL LVL3 (GOWN DISPOSABLE) ×2 IMPLANT
GOWN STRL REUS W/TWL XL LVL3 (GOWN DISPOSABLE) ×1
HANDPIECE INTERPULSE COAX TIP (DISPOSABLE) ×1
HOLDER FOLEY CATH W/STRAP (MISCELLANEOUS) ×2 IMPLANT
HOOD PEEL AWAY T7 (MISCELLANEOUS) ×6 IMPLANT
KIT TURNOVER KIT A (KITS) IMPLANT
MANIFOLD NEPTUNE II (INSTRUMENTS) ×2 IMPLANT
MARKER SKIN DUAL TIP RULER LAB (MISCELLANEOUS) ×2 IMPLANT
NDL SAFETY ECLIP 18X1.5 (MISCELLANEOUS) ×2 IMPLANT
NDL SPNL 18GX3.5 QUINCKE PK (NEEDLE) ×1 IMPLANT
NEEDLE SPNL 18GX3.5 QUINCKE PK (NEEDLE) ×1 IMPLANT
PACK ANTERIOR HIP CUSTOM (KITS) ×2 IMPLANT
PENCIL SMOKE EVACUATOR (MISCELLANEOUS) IMPLANT
SAW OSC TIP CART 19.5X105X1.3 (SAW) ×2 IMPLANT
SEALER BIPOLAR AQUA 6.0 (INSTRUMENTS) ×2 IMPLANT
SET HNDPC FAN SPRY TIP SCT (DISPOSABLE) ×2 IMPLANT
SOLUTION PRONTOSAN WOUND 350ML (IRRIGATION / IRRIGATOR) ×2 IMPLANT
SPIKE FLUID TRANSFER (MISCELLANEOUS) ×2 IMPLANT
SUT MNCRL AB 3-0 PS2 18 (SUTURE) ×2 IMPLANT
SUT MON AB 2-0 CT1 36 (SUTURE) ×2 IMPLANT
SUT STRATAFIX PDO 1 14 VIOLET (SUTURE) ×1
SUT STRATFX PDO 1 14 VIOLET (SUTURE) ×1
SUT VIC AB 2-0 CT1 27 (SUTURE)
SUT VIC AB 2-0 CT1 TAPERPNT 27 (SUTURE) IMPLANT
SUTURE STRATFX PDO 1 14 VIOLET (SUTURE) ×2 IMPLANT
SYR 3ML LL SCALE MARK (SYRINGE) ×2 IMPLANT
TRAY FOLEY MTR SLVR 16FR STAT (SET/KITS/TRAYS/PACK) IMPLANT
TUBE SUCTION HIGH CAP CLEAR NV (SUCTIONS) ×2 IMPLANT
WATER STERILE IRR 1000ML POUR (IV SOLUTION) ×2 IMPLANT

## 2022-06-24 NOTE — Progress Notes (Signed)
Patient surgery cancelled d/t positive UDS for cocaine. Patient is aware that Dr. Sid Falcon office will call to reschedule. Dr. Lyla Glassing to the bedside to discuss with patient.

## 2022-06-25 NOTE — Interval H&P Note (Signed)
Surgery cancelled due to positive UDS.   Hilton Cork Wyeth Hoffer

## 2022-07-24 ENCOUNTER — Other Ambulatory Visit (HOSPITAL_COMMUNITY): Payer: Medicare Other

## 2022-07-31 NOTE — Progress Notes (Signed)
Sent message, via epic in basket, requesting orders in epic from surgeon.  

## 2022-08-01 ENCOUNTER — Ambulatory Visit: Payer: Self-pay | Admitting: Student

## 2022-08-06 NOTE — Patient Instructions (Addendum)
DUE TO COVID-19 ONLY TWO VISITORS  (aged 68 and older)  ARE ALLOWED TO COME WITH YOU AND STAY IN THE WAITING ROOM ONLY DURING PRE OP AND PROCEDURE.   **NO VISITORS ARE ALLOWED IN THE SHORT STAY AREA OR RECOVERY ROOM!!**  IF YOU WILL BE ADMITTED INTO THE HOSPITAL YOU ARE ALLOWED ONLY FOUR SUPPORT PEOPLE DURING VISITATION HOURS ONLY (7 AM -8PM)   The support person(s) must pass our screening, gel in and out, and wear a mask at all times, including in the patient's room. Patients must also wear a mask when staff or their support person are in the room. Visitors GUEST BADGE MUST BE WORN VISIBLY  One adult visitor may remain with you overnight and MUST be in the room by 8 P.M.     Your procedure is scheduled on: 08/19/22   Report to [facility] Main Entrance    Report to admitting at 6:00  AM   Call this number if you have problems the morning of surgery [phone]   Do not eat food :After Midnight.   After Midnight you may have the following liquids until _5:30__ AM/  DAY OF SURGERY  Water Black Coffee (sugar ok, NO MILK/CREAM OR CREAMERS)  Tea (sugar ok, NO MILK/CREAM OR CREAMERS) regular and decaf                             Plain Jell-O (NO RED)                                           Fruit ices (not with fruit pulp, NO RED)                                     Popsicles (NO RED)                                                                  Juice: apple, WHITE grape, WHITE cranberry Sports drinks like Gatorade (NO RED)                   The day of surgery:  Drink ONE (1) Pre-Surgery Clear Ensure at  5:15 AM the morning of surgery. Drink in one sitting. Do not sip.  This drink was given to you during your hospital  pre-op appointment visit. Nothing else to drink after completing the  Pre-Surgery Clear Ensure at 5:30 AM          If you have questions, please contact your surgeon's office.      Oral Hygiene is also important to reduce your risk of infection.                                     Remember - BRUSH YOUR TEETH THE MORNING OF SURGERY WITH YOUR REGULAR TOOTHPASTE   Do NOT smoke after Midnight   Take these medicines the morning of surgery with A SIP OF WATER: Use your inhaler and  bring it with you                                                                                                                           Gabapentin                                                                                                                            Oxcarbazepine-Trileptal                                                                                                                             Mirtazapine-Remeron                                                                                                                             Risperidone-Risperdal    Bring CPAP mask and tubing day of surgery.                              You may not have any metal on your body including hair pins, jewelry, and body piercing             Do not wear make-up, lotions, powders, perfumes/cologne, or deodorant  Do not wear nail polish including gel and S&S, artificial/acrylic nails, or any other type of covering on natural nails including  finger and toenails. If you have artificial nails, gel coating, etc. that needs to be removed by a nail salon please have this removed prior to surgery or surgery may need to be canceled/ delayed if the surgeon/ anesthesia feels like they are unable to be safely monitored.   Do not shave  48 hours prior to surgery.     Do not bring valuables to the hospital. [place].   Contacts, glasses, or bridgework may not be worn into surgery.   Bring small overnight bag day of surgery.   DO NOT [place]. PHARMACY WILL DISPENSE MEDICATIONS LISTED ON YOUR MEDICATION LIST TO YOU DURING YOUR ADMISSION [place]!      Special Instructions:  Bring a copy of your healthcare power of attorney and living will documents    the day of surgery if you haven't scanned them before.              Please read over the following fact sheets you were given: IF YOU HAVE QUESTIONS ABOUT YOUR PRE-OP INSTRUCTIONS PLEASE CALL [phone]    [facility] - Preparing for Surgery Before surgery, you can play an important role.  Because skin is not sterile, your skin needs to be as free of germs as possible.  You can reduce the number of germs on your skin by washing with CHG (chlorahexidine gluconate) soap before surgery.  CHG is an antiseptic cleaner which kills germs and bonds with the skin to continue killing germs even after washing. Please DO NOT use if you have an allergy to CHG or antibacterial soaps.  If your skin becomes reddened/irritated stop using the CHG and inform your nurse when you arrive at Short Stay. Do not shave (including legs and underarms) for at least 48 hours prior to the first CHG shower.    Please follow these instructions carefully:  1.  Shower with CHG Soap the night before surgery and the  morning of Surgery.  2.  If you choose to wash your hair, wash your hair first as usual with your  normal  shampoo.  3.  After you shampoo, rinse your hair and body thoroughly to remove the  shampoo.                            4.  Use CHG as you would any other liquid soap.  You can apply chg directly  to the skin and wash                       Gently with a scrungie or clean washcloth.  5.  Apply the CHG Soap to your body ONLY FROM THE NECK DOWN.   Do not use on face/ open                           Wound or open sores. Avoid contact with eyes, ears mouth and genitals (private parts).                       Wash face,  Genitals (private parts) with your normal soap.             6.  Wash thoroughly, paying special attention to the area where your surgery  will  be performed.  7.  Thoroughly rinse your body with warm water from the neck  down.  8.  DO NOT shower/wash with your normal soap after using and rinsing off  the CHG Soap.             9.  Pat yourself dry with a clean towel.            10.  Wear clean pajamas.            11.  Place clean sheets on your bed the night of your first shower and do not  sleep with pets. Day of Surgery : Do not apply any lotions/deodorants the morning of surgery.  Please wear clean clothes to the hospital/surgery center.  FAILURE TO FOLLOW THESE INSTRUCTIONS MAY RESULT IN THE CANCELLATION OF YOUR SURGERY   PATIENT SIGNATURE_________________________________   ________________________________________________________________________  Terry Potter  An incentive spirometer is a tool that can help keep your lungs clear and active. This tool measures how well you are filling your lungs with each breath. Taking long deep breaths may help reverse or decrease the chance of developing breathing (pulmonary) problems (especially infection) following: A long period of time when you are unable to move or be active. BEFORE THE PROCEDURE  If the spirometer includes an indicator to show your best effort, your nurse or respiratory therapist will set it to a desired goal. If possible, sit up straight or lean slightly forward. Try not to slouch. Hold the incentive spirometer in an upright position. INSTRUCTIONS FOR USE  Sit on the edge of your bed if possible, or sit up as far as you can in bed or on a chair. Hold the incentive spirometer in an upright position. Breathe out normally. Place the mouthpiece in your mouth and seal your lips tightly around it. Breathe in slowly and as deeply as possible, raising the piston or the ball toward the top of the column. Hold your breath for 3-5 seconds or for as long as possible. Allow the piston or ball to fall to the bottom of the column. Remove the mouthpiece from your mouth and breathe out normally. Rest for a few seconds and repeat Steps 1 through 7  at least 10 times every 1-2 hours when you are awake. Take your time and take a few normal breaths between deep breaths. The spirometer may include an indicator to show your best effort. Use the indicator as a goal to work toward during each repetition. After each set of 10 deep breaths, practice coughing to be sure your lungs are clear. If you have an incision (the cut made at the time of surgery), support your incision when coughing by placing a pillow or rolled up towels firmly against it. Once you are able to get out of bed, walk around indoors and cough well. You may stop using the incentive spirometer when instructed by your caregiver.  RISKS AND COMPLICATIONS Take your time so you do not get dizzy or light-headed. If you are in pain, you may need to take or ask for pain medication before doing incentive spirometry. It is harder to take a deep breath if you are having pain. AFTER USE Rest and breathe slowly and easily. It can be helpful to keep track of a log of your progress. Your caregiver can provide you with a simple table to help with this. If you are using the spirometer at home, follow these instructions: [place] IF:  You are having difficultly using the  spirometer. You have trouble using the spirometer as often as instructed. Your pain medication is not giving enough relief while using the spirometer. You develop fever of 100.5 F (38.1 C) or higher. SEEK IMMEDIATE MEDICAL CARE IF:  You cough up bloody sputum that had not been present before. You develop fever of 102 F (38.9 C) or greater. You develop worsening pain at or near the incision site. MAKE SURE YOU:  Understand these instructions. Will watch your condition. Will get help right away if you are not doing well or get worse. Document Released: 10/12/2006 Document Revised: 08/24/2011 Document Reviewed: 12/13/2006 [facility] Patient Information 2014 [place],  [place].   ________________________________________________________________________

## 2022-08-07 ENCOUNTER — Encounter (HOSPITAL_COMMUNITY): Payer: Self-pay

## 2022-08-07 ENCOUNTER — Encounter (HOSPITAL_COMMUNITY)
Admission: RE | Admit: 2022-08-07 | Discharge: 2022-08-07 | Disposition: A | Discharge status: Home / Self Care | Payer: Medicare HMO | Source: Ambulatory Visit | Attending: Orthopedic Surgery | Admitting: Orthopedic Surgery

## 2022-08-07 ENCOUNTER — Other Ambulatory Visit: Payer: Self-pay

## 2022-08-07 DIAGNOSIS — Z01812 Encounter for preprocedural laboratory examination: Secondary | ICD-10-CM | POA: Insufficient documentation

## 2022-08-07 DIAGNOSIS — I10 Essential (primary) hypertension: Secondary | ICD-10-CM | POA: Diagnosis not present

## 2022-08-07 DIAGNOSIS — Z01818 Encounter for other preprocedural examination: Secondary | ICD-10-CM

## 2022-08-07 LAB — CBC
HCT: 39.7 % (ref 36.0–46.0)
Hemoglobin: 12.9 g/dL (ref 12.0–15.0)
MCH: 31.6 pg (ref 26.0–34.0)
MCHC: 32.5 g/dL (ref 30.0–36.0)
MCV: 97.3 fL (ref 80.0–100.0)
Platelets: 220 10*3/uL (ref 150–400)
RBC: 4.08 MIL/uL (ref 3.87–5.11)
RDW: 14.1 % (ref 11.5–15.5)
WBC: 6.1 10*3/uL (ref 4.0–10.5)
nRBC: 0 % (ref 0.0–0.2)

## 2022-08-07 LAB — TYPE AND SCREEN
ABO/RH(D): A POS
Antibody Screen: NEGATIVE

## 2022-08-07 LAB — BASIC METABOLIC PANEL
Anion gap: 7 (ref 5–15)
BUN: 26 mg/dL — ABNORMAL HIGH (ref 8–23)
CO2: 26 mmol/L (ref 22–32)
Calcium: 8.7 mg/dL — ABNORMAL LOW (ref 8.9–10.3)
Chloride: 103 mmol/L (ref 98–111)
Creatinine, Ser: 0.88 mg/dL (ref 0.44–1.00)
GFR, Estimated: >60 mL/min (ref >60)
Glucose, Bld: 97 mg/dL (ref 70–99)
Potassium: 4.1 mmol/L (ref 3.5–5.1)
Sodium: 136 mmol/L (ref 135–145)

## 2022-08-07 LAB — SURGICAL PCR SCREEN
MRSA, PCR: NEGATIVE
Staphylococcus aureus: NEGATIVE

## 2022-08-07 NOTE — Progress Notes (Signed)
Anesthesia note:    PCP - Arthur Holms NP Cardiologist -Dr. Christella Scheuermann Other-   Chest x-ray -  EKG -  Stress Test - 05/28/22-epic ECHO - 05/28/22-epic Cardiac Cath - no CABG-no Pacemaker/ICD device last checked:na  Sleep Study - no CPAP -   Pt is pre diabetic-no CBG at PAT visit- Fasting Blood Sugar at home- Checks Blood Sugar _____  Blood Thinner:ASA 325 mg Blood Thinner Instructions: Aspirin Instructions:hold 7 days Last Dose:2/28  Anesthesia review: Yes   reason:cardiac  Patient denies shortness of breath, fever, cough and chest pain at PAT appointment. Pt reports no SOB with her activities. She lives in a [place] and has a home aide. Last cocaine use was 2/17 24.   Patient verbalized understanding of instructions that were given to them at the PAT appointment. Patient was also instructed that they will need to review over the PAT instructions again at home before surgery.yes

## 2022-08-11 ENCOUNTER — Ambulatory Visit: Payer: Self-pay | Admitting: Student

## 2022-08-11 NOTE — H&P (View-Only) (Signed)
TOTAL HIP ADMISSION H&P  Patient is admitted for right total hip arthroplasty.  Subjective:  Chief Complaint: right hip pain  HPI: Terry Potter, 67 y.o. female, has a history of pain and functional disability in the right hip(s) due to arthritis and patient has failed non-surgical conservative treatments for greater than 12 weeks to include NSAID's and/or analgesics, flexibility and strengthening excercises, use of assistive devices, and activity modification.  Onset of symptoms was gradual starting 10 years ago with rapidlly worsening course since that time.The patient noted no past surgery on the right hip(s).  Patient currently rates pain in the right hip at 10 out of 10 with activity. Patient has night pain, worsening of pain with activity and weight bearing, trendelenberg gait, pain that interfers with activities of daily living, and pain with passive range of motion. Patient has evidence of subchondral cysts, subchondral sclerosis, periarticular osteophytes, and joint space narrowing by imaging studies. This condition presents safety issues increasing the risk of falls.  There is no current active infection.  Patient Active Problem List   Diagnosis Date Noted   Grief 01/23/2021   PTSD (post-traumatic stress disorder) 08/13/2020   Cocaine use disorder, moderate, in controlled environment ([place]) 07/23/2020   Cocaine abuse with cocaine-induced mood disorder ([place]) 07/23/2020   Bacterial vaginosis 02/01/2018   Cocaine abuse ([place]) 02/01/2018   Pulmonary nodules 02/01/2018   Syncope 02/01/2018   Hip fracture, unspecified laterality, closed, initial encounter ([place]) 01/01/2017   Hip fracture ([place]) 01/01/2017   Displaced fracture of left femoral neck ([place]) 01/01/2017   Schizoaffective disorder, bipolar type ([place]) 03/19/2015   Past Medical History:  Diagnosis Date   Abnormal EKG    Arthritis    Asthma    Bipolar disorder ([place])    Ectopic pregnancy    Hip pain    Marijuana use     Protein-calorie malnutrition, severe (HCC)    Shoulder pain    Tobacco dependence    cocaine ,Marijuana    Past Surgical History:  Procedure Laterality Date   LAPAROSCOPY FOR ECTOPIC PREGNANCY     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   Procedure: TOTAL HIP ARTHROPLASTY ANTERIOR APPROACH;  Surgeon: Rod Can, MD;  Location: [facility];  Service: Orthopedics;  Laterality: Left;   TUMOR REMOVAL  1990   Uterine tumor removed    No current facility-administered medications for this visit.   No current outpatient medications on file.   Facility-Administered Medications Ordered in Other Visits  Medication Dose Route Frequency Provider Last Rate Last Admin   acetaminophen (TYLENOL) tablet 1,000 mg  1,000 mg Oral Once Rotha Cassels S, PA-C       ceFAZolin (ANCEF) IVPB 2g/100 mL premix  2 g Intravenous On Call to [place], Debrina Kizer S, PA-C       chlorhexidine (PERIDEX) 0.12 % solution 15 mL  15 mL Mouth/Throat Once Woodrum, Chelsey L, MD       Or   Oral care mouth rinse  15 mL Mouth Rinse Once Woodrum, Chelsey L, MD       lactated ringers infusion   Intravenous Continuous Woodrum, Chelsey L, MD       lactated ringers infusion   Intravenous Continuous Sheilla Maris S, PA-C       povidone-iodine 10 % swab 2 Application  2 Application Topical Once [place] Hijazi S, PA-C       povidone-iodine 10 % swab 2 Application  2 Application Topical Once Charlott Rakes, PA-C  tranexamic acid (CYKLOKAPRON) IVPB 1,000 mg  1,000 mg Intravenous To OR Nikos Anglemyer S, PA-C       Allergies  Allergen Reactions   Tetracyclines & Related Hives    Social History   Tobacco Use   Smoking status: Every Day    Packs/day: 0.25    Types: Cigarettes, Cigars   Smokeless tobacco: Never  Substance Use Topics   Alcohol use: Yes    Alcohol/week: 2.0 standard drinks of alcohol    Types: 2 Shots of liquor per week    Comment: occ    Family History  Problem Relation Age of Onset   Cancer Mother      Review of Systems   Musculoskeletal:  Positive for arthralgias and gait problem.  All other systems reviewed and are negative.   Objective:  Physical Exam Constitutional:      Appearance: Normal appearance.  HENT:     Head: Normocephalic and atraumatic.     Nose: Nose normal.     Mouth/Throat:     Mouth: Mucous membranes are moist.     Pharynx: Oropharynx is clear.  Eyes:     Conjunctiva/sclera: Conjunctivae normal.  Cardiovascular:     Rate and Rhythm: Normal rate and regular rhythm.     Pulses: Normal pulses.     Heart sounds: Normal heart sounds.  Pulmonary:     Effort: Pulmonary effort is normal.     Breath sounds: Normal breath sounds.  Abdominal:     General: Abdomen is flat.     Palpations: Abdomen is soft.  Genitourinary:    Comments: deferred Musculoskeletal:     Cervical back: Normal range of motion.     Comments: Examination of the right hip reveals no skin wounds or lesions. Mild trochanteric tenderness to palpation. There is severely restricted range of motion of right hip. Pain in the position of impingement. Pain with terminal flexion rotation.   Neurovascular intact distally.  Ambulates with an antalgic gait.   Skin:    General: Skin is warm and dry.     Capillary Refill: Capillary refill takes less than 2 seconds.  Neurological:     General: No focal deficit present.     Mental Status: She is alert and oriented to person, place, and time.  Psychiatric:        Mood and Affect: Mood normal.        Behavior: Behavior normal.        Thought Content: Thought content normal.        Judgment: Judgment normal.     Vital signs in last 24 hours: '@VSRANGES'$ @  Labs:   Estimated body mass index is 24.87 kg/m as calculated from the following:   Height as of 08/07/22: '5\' 2"'$  (1.575 m).   Weight as of 08/07/22: 61.7 kg.   Imaging Review Plain radiographs demonstrate severe degenerative joint disease of the right hip(s). The bone quality appears to be adequate for age and  reported activity level.      Assessment/Plan:  End stage arthritis, right hip(s)  The patient history, physical examination, clinical judgement of the provider and imaging studies are consistent with end stage degenerative joint disease of the right hip(s) and total hip arthroplasty is deemed medically necessary. The treatment options including medical management, injection therapy, arthroscopy and arthroplasty were discussed at length. The risks and benefits of total hip arthroplasty were presented and reviewed. The risks due to aseptic loosening, infection, stiffness, dislocation/subluxation,  thromboembolic complications and other imponderables were  discussed.  The patient acknowledged the explanation, agreed to proceed with the plan and consent was signed. Patient is being admitted for inpatient treatment for surgery, pain control, PT, OT, prophylactic antibiotics, VTE prophylaxis, progressive ambulation and ADL's and discharge planning.The patient is planning to be discharged home after an overnight stay with [facility].   Therapy Plans: [facility].  Disposition: Home with Renata [facility], Nurse.  Planned DVT Prophylaxis: aspirin '325mg'$  BID DME needed: 3-n-1 and rolling walker.  PCP: Cleared.  Cardiology: Cleared.  TXA: IV Allergies:  - Tetracycline - skin blistering.  Anesthesia Concerns: None.  BMI: 24.8 Last HgbA1c: 5.5 Other: - COPD**  - History of cocaine abuse.  - Aspirin '325mg'$  BID.  - Hydrocodone, zofran.  - 05/05/22: Cr. 0.73, Hgb 13.8. - 06/23/22: Albumin 3.0**, Cr. 0.71, Hgb 13.3. - 08/07/22: Hgb 12.9, K+ Cr. 0.88   Patient's anticipated LOS is less than 2 midnights, meeting these requirements: - Younger than 74 - Lives within 1 hour of care - Has a competent adult at home to recover with post-op recover - NO history of  - Chronic pain requiring opiods  - Diabetes  - Coronary Artery Disease  - Heart failure  - Heart attack  - Stroke  - DVT/VTE  - Cardiac arrhythmia  -  Respiratory Failure/COPD  - Renal failure  - Anemia  - Advanced Liver disease

## 2022-08-11 NOTE — H&P (Signed)
TOTAL HIP ADMISSION H&P  Patient is admitted for right total hip arthroplasty.  Subjective:  Chief Complaint: right hip pain  HPI: Terry Potter, 67 y.o. female, has a history of pain and functional disability in the right hip(s) due to arthritis and patient has failed non-surgical conservative treatments for greater than 12 weeks to include NSAID's and/or analgesics, flexibility and strengthening excercises, use of assistive devices, and activity modification.  Onset of symptoms was gradual starting 10 years ago with rapidlly worsening course since that time.The patient noted no past surgery on the right hip(s).  Patient currently rates pain in the right hip at 10 out of 10 with activity. Patient has night pain, worsening of pain with activity and weight bearing, trendelenberg gait, pain that interfers with activities of daily living, and pain with passive range of motion. Patient has evidence of subchondral cysts, subchondral sclerosis, periarticular osteophytes, and joint space narrowing by imaging studies. This condition presents safety issues increasing the risk of falls.  There is no current active infection.  Patient Active Problem List   Diagnosis Date Noted   Grief 01/23/2021   PTSD (post-traumatic stress disorder) 08/13/2020   Cocaine use disorder, moderate, in controlled environment ([place]) 07/23/2020   Cocaine abuse with cocaine-induced mood disorder ([place]) 07/23/2020   Bacterial vaginosis 02/01/2018   Cocaine abuse ([place]) 02/01/2018   Pulmonary nodules 02/01/2018   Syncope 02/01/2018   Hip fracture, unspecified laterality, closed, initial encounter ([place]) 01/01/2017   Hip fracture ([place]) 01/01/2017   Displaced fracture of left femoral neck ([place]) 01/01/2017   Schizoaffective disorder, bipolar type ([place]) 03/19/2015   Past Medical History:  Diagnosis Date   Abnormal EKG    Arthritis    Asthma    Bipolar disorder ([place])    Ectopic pregnancy    Hip pain    Marijuana use     Protein-calorie malnutrition, severe ([facility])    Shoulder pain    Tobacco dependence    cocaine ,Marijuana    Past Surgical History:  Procedure Laterality Date   LAPAROSCOPY FOR ECTOPIC PREGNANCY     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   Procedure: TOTAL HIP ARTHROPLASTY ANTERIOR APPROACH;  Surgeon: Rod Can, MD;  Location: [facility];  Service: Orthopedics;  Laterality: Left;   TUMOR REMOVAL  1990   Uterine tumor removed    No current facility-administered medications for this visit.   No current outpatient medications on file.   Facility-Administered Medications Ordered in Other Visits  Medication Dose Route Frequency Provider Last Rate Last Admin   acetaminophen (TYLENOL) tablet 1,000 mg  1,000 mg Oral Once Markell Schrier S, PA-C       ceFAZolin (ANCEF) IVPB 2g/100 mL premix  2 g Intravenous On Call to [place], Geniece Akers S, PA-C       chlorhexidine (PERIDEX) 0.12 % solution 15 mL  15 mL Mouth/Throat Once Woodrum, Chelsey L, MD       Or   Oral care mouth rinse  15 mL Mouth Rinse Once Woodrum, Chelsey L, MD       lactated ringers infusion   Intravenous Continuous Woodrum, Chelsey L, MD       lactated ringers infusion   Intravenous Continuous Sandhya Denherder S, PA-C       povidone-iodine 10 % swab 2 Application  2 Application Topical Once Seryna Marek S, PA-C       povidone-iodine 10 % swab 2 Application  2 Application Topical Once Charlott Rakes, PA-C  tranexamic acid (CYKLOKAPRON) IVPB 1,000 mg  1,000 mg Intravenous To OR Lamin Chandley S, PA-C       Allergies  Allergen Reactions   Tetracyclines & Related Hives    Social History   Tobacco Use   Smoking status: Every Day    Packs/day: 0.25    Types: Cigarettes, Cigars   Smokeless tobacco: Never  Substance Use Topics   Alcohol use: Yes    Alcohol/week: 2.0 standard drinks of alcohol    Types: 2 Shots of liquor per week    Comment: occ    Family History  Problem Relation Age of Onset   Cancer Mother      Review of Systems   Musculoskeletal:  Positive for arthralgias and gait problem.  All other systems reviewed and are negative.   Objective:  Physical Exam Constitutional:      Appearance: Normal appearance.  HENT:     Head: Normocephalic and atraumatic.     Nose: Nose normal.     Mouth/Throat:     Mouth: Mucous membranes are moist.     Pharynx: Oropharynx is clear.  Eyes:     Conjunctiva/sclera: Conjunctivae normal.  Cardiovascular:     Rate and Rhythm: Normal rate and regular rhythm.     Pulses: Normal pulses.     Heart sounds: Normal heart sounds.  Pulmonary:     Effort: Pulmonary effort is normal.     Breath sounds: Normal breath sounds.  Abdominal:     General: Abdomen is flat.     Palpations: Abdomen is soft.  Genitourinary:    Comments: deferred Musculoskeletal:     Cervical back: Normal range of motion.     Comments: Examination of the right hip reveals no skin wounds or lesions. Mild trochanteric tenderness to palpation. There is severely restricted range of motion of right hip. Pain in the position of impingement. Pain with terminal flexion rotation.   Neurovascular intact distally.  Ambulates with an antalgic gait.   Skin:    General: Skin is warm and dry.     Capillary Refill: Capillary refill takes less than 2 seconds.  Neurological:     General: No focal deficit present.     Mental Status: She is alert and oriented to person, place, and time.  Psychiatric:        Mood and Affect: Mood normal.        Behavior: Behavior normal.        Thought Content: Thought content normal.        Judgment: Judgment normal.     Vital signs in last 24 hours: '@VSRANGES'$ @  Labs:   Estimated body mass index is 24.87 kg/m as calculated from the following:   Height as of 08/07/22: '5\' 2"'$  (1.575 m).   Weight as of 08/07/22: 61.7 kg.   Imaging Review Plain radiographs demonstrate severe degenerative joint disease of the right hip(s). The bone quality appears to be adequate for age and  reported activity level.      Assessment/Plan:  End stage arthritis, right hip(s)  The patient history, physical examination, clinical judgement of the provider and imaging studies are consistent with end stage degenerative joint disease of the right hip(s) and total hip arthroplasty is deemed medically necessary. The treatment options including medical management, injection therapy, arthroscopy and arthroplasty were discussed at length. The risks and benefits of total hip arthroplasty were presented and reviewed. The risks due to aseptic loosening, infection, stiffness, dislocation/subluxation,  thromboembolic complications and other imponderables were  discussed.  The patient acknowledged the explanation, agreed to proceed with the plan and consent was signed. Patient is being admitted for inpatient treatment for surgery, pain control, PT, OT, prophylactic antibiotics, VTE prophylaxis, progressive ambulation and ADL's and discharge planning.The patient is planning to be discharged home after an overnight stay with [facility].   Therapy Plans: [facility].  Disposition: Home with Renata Caprice, Nurse.  Planned DVT Prophylaxis: aspirin '325mg'$  BID DME needed: 3-n-1 and rolling walker.  PCP: Cleared.  Cardiology: Cleared.  TXA: IV Allergies:  - Tetracycline - skin blistering.  Anesthesia Concerns: None.  BMI: 24.8 Last HgbA1c: 5.5 Other: - COPD**  - History of cocaine abuse.  - Aspirin '325mg'$  BID.  - Hydrocodone, zofran.  - 05/05/22: Cr. 0.73, Hgb 13.8. - 06/23/22: Albumin 3.0**, Cr. 0.71, Hgb 13.3. - 08/07/22: Hgb 12.9, K+ Cr. 0.88   Patient's anticipated LOS is less than 2 midnights, meeting these requirements: - Younger than 1 - Lives within 1 hour of care - Has a competent adult at home to recover with post-op recover - NO history of  - Chronic pain requiring opiods  - Diabetes  - Coronary Artery Disease  - Heart failure  - Heart attack  - Stroke  - DVT/VTE  - Cardiac arrhythmia  -  Respiratory Failure/COPD  - Renal failure  - Anemia  - Advanced Liver disease

## 2022-08-17 ENCOUNTER — Other Ambulatory Visit: Payer: Self-pay | Admitting: Student

## 2022-08-17 DIAGNOSIS — E2839 Other primary ovarian failure: Secondary | ICD-10-CM

## 2022-08-18 ENCOUNTER — Ambulatory Visit (HOSPITAL_COMMUNITY): Payer: Medicare HMO | Admitting: Certified Registered Nurse Anesthetist

## 2022-08-19 ENCOUNTER — Ambulatory Visit (HOSPITAL_COMMUNITY)
Admission: RE | Admit: 2022-08-19 | Discharge: 2022-08-19 | Disposition: A | Discharge status: Home / Self Care | Payer: Medicare HMO | Source: Ambulatory Visit | Attending: Orthopedic Surgery | Admitting: Orthopedic Surgery

## 2022-08-19 ENCOUNTER — Encounter (HOSPITAL_COMMUNITY): Payer: Self-pay | Admitting: Orthopedic Surgery

## 2022-08-19 ENCOUNTER — Encounter (HOSPITAL_COMMUNITY)
Admission: RE | Disposition: A | Discharge status: Home / Self Care | Payer: Self-pay | Source: Ambulatory Visit | Attending: Orthopedic Surgery

## 2022-08-19 DIAGNOSIS — F1411 Cocaine abuse, in remission: Secondary | ICD-10-CM | POA: Diagnosis not present

## 2022-08-19 DIAGNOSIS — F319 Bipolar disorder, unspecified: Secondary | ICD-10-CM | POA: Insufficient documentation

## 2022-08-19 DIAGNOSIS — F17210 Nicotine dependence, cigarettes, uncomplicated: Secondary | ICD-10-CM | POA: Insufficient documentation

## 2022-08-19 DIAGNOSIS — M1611 Unilateral primary osteoarthritis, right hip: Secondary | ICD-10-CM | POA: Diagnosis present

## 2022-08-19 DIAGNOSIS — R2689 Other abnormalities of gait and mobility: Secondary | ICD-10-CM | POA: Diagnosis not present

## 2022-08-19 DIAGNOSIS — F419 Anxiety disorder, unspecified: Secondary | ICD-10-CM | POA: Insufficient documentation

## 2022-08-19 DIAGNOSIS — Z539 Procedure and treatment not carried out, unspecified reason: Secondary | ICD-10-CM | POA: Diagnosis not present

## 2022-08-19 DIAGNOSIS — J45909 Unspecified asthma, uncomplicated: Secondary | ICD-10-CM | POA: Insufficient documentation

## 2022-08-19 DIAGNOSIS — I10 Essential (primary) hypertension: Secondary | ICD-10-CM

## 2022-08-19 DIAGNOSIS — Z01818 Encounter for other preprocedural examination: Secondary | ICD-10-CM

## 2022-08-19 DIAGNOSIS — F17290 Nicotine dependence, other tobacco product, uncomplicated: Secondary | ICD-10-CM | POA: Insufficient documentation

## 2022-08-19 LAB — RAPID URINE DRUG SCREEN, HOSP PERFORMED
Amphetamines: NOT DETECTED
Barbiturates: NOT DETECTED
Benzodiazepines: NOT DETECTED
Cocaine: POSITIVE — AB
Opiates: NOT DETECTED
Tetrahydrocannabinol: NOT DETECTED

## 2022-08-19 SURGERY — ARTHROPLASTY, HIP, TOTAL, ANTERIOR APPROACH
Anesthesia: Spinal

## 2022-08-19 MED ORDER — MIDAZOLAM HCL 2 MG/2ML IJ SOLN
INTRAMUSCULAR | Status: AC
  Filled 2022-08-19: qty 2

## 2022-08-19 MED ORDER — BUPIVACAINE-EPINEPHRINE (PF) 0.25% -1:200000 IJ SOLN
INTRAMUSCULAR | Status: AC
  Filled 2022-08-19: qty 30

## 2022-08-19 MED ORDER — KETOROLAC TROMETHAMINE 30 MG/ML IJ SOLN
INTRAMUSCULAR | Status: AC
  Filled 2022-08-19: qty 1

## 2022-08-19 MED ORDER — CEFAZOLIN SODIUM-DEXTROSE 2-4 GM/100ML-% IV SOLN
2.0000 g | INTRAVENOUS | Status: DC
  Filled 2022-08-19: qty 100

## 2022-08-19 MED ORDER — SODIUM CHLORIDE (PF) 0.9 % IJ SOLN
INTRAMUSCULAR | Status: AC
  Filled 2022-08-19: qty 30

## 2022-08-19 MED ORDER — PROPOFOL 1000 MG/100ML IV EMUL
INTRAVENOUS | Status: AC
  Filled 2022-08-19: qty 100

## 2022-08-19 MED ORDER — ACETAMINOPHEN 500 MG PO TABS
1000.0000 mg | ORAL_TABLET | Freq: Once | ORAL | Status: AC
  Administered 2022-08-19: 1000 mg via ORAL
  Filled 2022-08-19: qty 2

## 2022-08-19 MED ORDER — LACTATED RINGERS IV SOLN: INTRAVENOUS | Status: DC

## 2022-08-19 MED ORDER — CHLORHEXIDINE GLUCONATE 0.12 % MT SOLN
15.0000 mL | Freq: Once | OROMUCOSAL | Status: AC
  Administered 2022-08-19: 15 mL via OROMUCOSAL

## 2022-08-19 MED ORDER — TRANEXAMIC ACID-NACL 1000-0.7 MG/100ML-% IV SOLN
1000.0000 mg | INTRAVENOUS | Status: DC
  Filled 2022-08-19: qty 100

## 2022-08-19 MED ORDER — POVIDONE-IODINE 10 % EX SWAB: 2.0000 | Freq: Once | CUTANEOUS | Status: DC

## 2022-08-19 MED ORDER — ORAL CARE MOUTH RINSE: 15.0000 mL | Freq: Once | OROMUCOSAL | Status: AC

## 2022-08-19 SURGICAL SUPPLY — 52 items
ADH SKN CLS APL DERMABOND .7 (GAUZE/BANDAGES/DRESSINGS) ×2
APL PRP STRL LF DISP 70% ISPRP (MISCELLANEOUS) ×1
BAG COUNTER SPONGE SURGICOUNT (BAG) IMPLANT
BAG DECANTER FOR FLEXI CONT (MISCELLANEOUS) IMPLANT
BAG SPEC THK2 15X12 ZIP CLS (MISCELLANEOUS)
BAG SPNG CNTER NS LX DISP (BAG)
BAG ZIPLOCK 12X15 (MISCELLANEOUS) IMPLANT
CHLORAPREP W/TINT 26 (MISCELLANEOUS) ×2 IMPLANT
COVER PERINEAL POST (MISCELLANEOUS) ×2 IMPLANT
COVER SURGICAL LIGHT HANDLE (MISCELLANEOUS) ×2 IMPLANT
DERMABOND ADVANCED .7 DNX12 (GAUZE/BANDAGES/DRESSINGS) ×4 IMPLANT
DRAPE IMP U-DRAPE 54X76 (DRAPES) ×2 IMPLANT
DRAPE SHEET LG 3/4 BI-LAMINATE (DRAPES) ×6 IMPLANT
DRAPE STERI IOBAN 125X83 (DRAPES) ×2 IMPLANT
DRAPE U-SHAPE 47X51 STRL (DRAPES) ×4 IMPLANT
DRSG AQUACEL AG ADV 3.5X10 (GAUZE/BANDAGES/DRESSINGS) ×2 IMPLANT
ELECT REM PT RETURN 15FT ADLT (MISCELLANEOUS) ×2 IMPLANT
GAUZE SPONGE 4X4 12PLY STRL (GAUZE/BANDAGES/DRESSINGS) ×2 IMPLANT
GLOVE BIO SURGEON STRL SZ7 (GLOVE) ×2 IMPLANT
GLOVE BIO SURGEON STRL SZ8.5 (GLOVE) ×4 IMPLANT
GLOVE BIOGEL PI IND STRL 7.5 (GLOVE) ×2 IMPLANT
GLOVE BIOGEL PI IND STRL 8.5 (GLOVE) ×2 IMPLANT
GOWN SPEC L3 XXLG W/TWL (GOWN DISPOSABLE) ×2 IMPLANT
GOWN STRL REUS W/ TWL XL LVL3 (GOWN DISPOSABLE) ×2 IMPLANT
GOWN STRL REUS W/TWL XL LVL3 (GOWN DISPOSABLE) ×1
HANDPIECE INTERPULSE COAX TIP (DISPOSABLE) ×1
HOLDER FOLEY CATH W/STRAP (MISCELLANEOUS) ×2 IMPLANT
HOOD PEEL AWAY T7 (MISCELLANEOUS) ×6 IMPLANT
KIT TURNOVER KIT A (KITS) IMPLANT
MANIFOLD NEPTUNE II (INSTRUMENTS) ×2 IMPLANT
MARKER SKIN DUAL TIP RULER LAB (MISCELLANEOUS) ×2 IMPLANT
NDL SAFETY ECLIP 18X1.5 (MISCELLANEOUS) ×2 IMPLANT
NDL SPNL 18GX3.5 QUINCKE PK (NEEDLE) ×1 IMPLANT
NEEDLE SPNL 18GX3.5 QUINCKE PK (NEEDLE) ×1 IMPLANT
PACK ANTERIOR HIP CUSTOM (KITS) ×2 IMPLANT
PENCIL SMOKE EVACUATOR (MISCELLANEOUS) IMPLANT
SAW OSC TIP CART 19.5X105X1.3 (SAW) ×2 IMPLANT
SEALER BIPOLAR AQUA 6.0 (INSTRUMENTS) ×2 IMPLANT
SET HNDPC FAN SPRY TIP SCT (DISPOSABLE) ×2 IMPLANT
SOLUTION PRONTOSAN WOUND 350ML (IRRIGATION / IRRIGATOR) ×2 IMPLANT
SPIKE FLUID TRANSFER (MISCELLANEOUS) ×2 IMPLANT
SUT MNCRL AB 3-0 PS2 18 (SUTURE) ×2 IMPLANT
SUT MON AB 2-0 CT1 36 (SUTURE) ×2 IMPLANT
SUT STRATAFIX PDO 1 14 VIOLET (SUTURE) ×1
SUT STRATFX PDO 1 14 VIOLET (SUTURE) ×1
SUT VIC AB 2-0 CT1 27 (SUTURE)
SUT VIC AB 2-0 CT1 TAPERPNT 27 (SUTURE) IMPLANT
SUTURE STRATFX PDO 1 14 VIOLET (SUTURE) ×2 IMPLANT
SYR 3ML LL SCALE MARK (SYRINGE) ×2 IMPLANT
TRAY FOLEY MTR SLVR 16FR STAT (SET/KITS/TRAYS/PACK) IMPLANT
TUBE SUCTION HIGH CAP CLEAR NV (SUCTIONS) ×2 IMPLANT
WATER STERILE IRR 1000ML POUR (IV SOLUTION) ×2 IMPLANT

## 2022-08-19 NOTE — Interval H&P Note (Signed)
History and Physical Interval Note:  08/19/2022 8:01 AM  Terry Potter  has presented today for surgery, with the diagnosis of Right hip osteoarthritits.  The various methods of treatment have been discussed with the patient and family. After consideration of risks, benefits and other options for treatment, the patient has consented to  Procedure(s) with comments: [place] (Right) - 150 as a surgical intervention.  The patient's history has been reviewed, patient examined, no change in status, stable for surgery.  I have reviewed the patient's chart and labs.  Questions were answered to the patient's satisfaction.    UDS positive for cocaine, therefore will cancel surgery.   Hilton Cork Tran Arzuaga

## 2022-08-19 NOTE — Interval H&P Note (Signed)
History and Physical Interval Note:  08/19/2022 7:24 AM  Terry Potter  has presented today for surgery, with the diagnosis of Right hip osteoarthritits.  The various methods of treatment have been discussed with the patient and family. After consideration of risks, benefits and other options for treatment, the patient has consented to  Procedure(s) with comments: [place] (Right) - 150 as a surgical intervention.  The patient's history has been reviewed, patient examined, no change in status, stable for surgery.  I have reviewed the patient's chart and labs.  Questions were answered to the patient's satisfaction.     The risks, benefits, and alternatives were discussed with the patient. There are risks associated with the surgery including, but not limited to, problems with anesthesia (death), infection, instability (giving out of the joint), dislocation, differences in leg length/angulation/rotation, fracture of bones, loosening or failure of implants, hematoma (blood accumulation) which may require surgical drainage, blood clots, pulmonary embolism, nerve injury (foot drop and lateral thigh numbness), and blood vessel injury. The patient understands these risks and elects to proceed.   Hilton Cork Ranbir Chew

## 2022-08-19 NOTE — Anesthesia Preprocedure Evaluation (Signed)
Anesthesia Evaluation  Patient identified by MRN, date of birth, ID band Patient awake    Reviewed: Allergy & Precautions, H&P , NPO status , Patient's Chart, lab work & pertinent test results  Airway Mallampati: II  TM Distance: >3 FB Neck ROM: Full    Dental no notable dental hx. (+) Partial Lower, Partial Upper, Poor Dentition, Dental Advisory Given   Pulmonary asthma , Current Smoker and Patient abstained from smoking.   Pulmonary exam normal breath sounds clear to auscultation       Cardiovascular negative cardio ROS  Rhythm:Regular Rate:Normal     Neuro/Psych   Anxiety  Bipolar Disorder Schizophrenia  negative neurological ROS     GI/Hepatic negative GI ROS, Neg liver ROS,,,  Endo/Other  negative endocrine ROS    Renal/GU negative Renal ROS  negative genitourinary   Musculoskeletal  (+) Arthritis , Osteoarthritis,    Abdominal   Peds  Hematology negative hematology ROS (+)   Anesthesia Other Findings   Reproductive/Obstetrics negative OB ROS                             Anesthesia Physical Anesthesia Plan  ASA: II  Anesthesia Plan: Spinal   Post-op Pain Management:    Induction: Intravenous  PONV Risk Score and Plan: 2 and Ondansetron, Midazolam and Treatment may vary due to age or medical condition  Airway Management Planned: Simple Face Mask  Additional Equipment:   Intra-op Plan:   Post-operative Plan:   Informed Consent: I have reviewed the patients History and Physical, chart, labs and discussed the procedure including the risks, benefits and alternatives for the proposed anesthesia with the patient or authorized representative who has indicated his/her understanding and acceptance.     Dental advisory given  Plan Discussed with: CRNA  Anesthesia Plan Comments:         Anesthesia Quick Evaluation

## 2022-08-31 ENCOUNTER — Ambulatory Visit (HOSPITAL_COMMUNITY)
Admission: EM | Admit: 2022-08-31 | Discharge: 2022-08-31 | Disposition: A | Discharge status: Home / Self Care | Payer: Medicare HMO | Attending: Registered Nurse | Admitting: Registered Nurse

## 2022-08-31 ENCOUNTER — Encounter (HOSPITAL_COMMUNITY): Payer: Self-pay | Admitting: Registered Nurse

## 2022-08-31 DIAGNOSIS — F4310 Post-traumatic stress disorder, unspecified: Secondary | ICD-10-CM | POA: Insufficient documentation

## 2022-08-31 DIAGNOSIS — F250 Schizoaffective disorder, bipolar type: Secondary | ICD-10-CM | POA: Diagnosis present

## 2022-08-31 DIAGNOSIS — F1420 Cocaine dependence, uncomplicated: Secondary | ICD-10-CM | POA: Diagnosis not present

## 2022-08-31 NOTE — ED Triage Notes (Signed)
Pt presents to [facility] voluntarily seeking substance use treatment. Pt reports she needs a hip replacement and she has to be clean in order for the doctor to complete the procedure. Pt states she would like assistance detox and substance use treatment. Pt reports using cocaine and alcohol. Pt reports using about $20 worth of cocaine a day, and 1-2, 12oz beers. Pt reports her last use was yesterday. Pt denies SI/HI and AVH.

## 2022-08-31 NOTE — BH Assessment (Signed)
Comprehensive Clinical Assessment (CCA) Note  08/31/2022 Terry Potter [id number]  DISPOSITION: Per Earleen Newport NP pt is recommended for CD-IOP and psychiatrically cleared  The patient demonstrates the following risk factors for suicide: Chronic risk factors for suicide include: psychiatric disorder of Bipolar d/o, substance use disorder, and previous suicide attempts in the past . Acute risk factors for suicide include: unemployment and social withdrawal/isolation. Protective factors for this patient include: positive social support, positive therapeutic relationship, and hope for the future. Considering these factors, the overall suicide risk at this point appears to be low. Patient is appropriate for outpatient follow up.   Per Triage assessment:" Pt presents to [facility] voluntarily seeking substance use treatment. Pt reports she needs a hip replacement and she has to be clean in order for the doctor to complete the procedure. Pt states she would like assistance detox and substance use treatment. Pt reports using cocaine and alcohol. Pt reports using about $20 worth of cocaine a day, and 1-2, 12oz beers. Pt reports her last use was yesterday. Pt denies SI/HI and AVH."  Additionally:  Pt stated she has a hx of Bipolar d/o and suicide attempts with the most recent SI occurring in 2020-09-17 following the death of her mother. Pt reported she has not attempted suicide "in years." Pt reported sh has been admitted psychiatrically multiple times with the last IP stay in 2020/09/17 or early 17-Sep-2021 due to her grief.   Pt stated she lives alone and has help as needed from her daughter and 4 grandchildren. Pt stated she has a new kitten she likes to play with. Pt stated she is limited in her mobility due to needing a hip replacement. Pt stated her hip replacement has been "put on hold" twice by the doctor due to her substance use. Pt is seeking detox and treatment at this time to facilitate sobriety so she can qualify  for a hip replacement.   Pt reported feeling sad, unmotivated and hopeless at times, fatigued and like a disappointment to herself. Pt stated that she sleeps about 6 hours a night in interrupted sleep and eats as she always has without change.    Chief Complaint:  Chief Complaint  Patient presents with   Addiction Problem   Visit Diagnosis:  Bipolar d/o Stimulant Use d/o, Cocaine. Moderate Alcohol Use d/o, Moderate    CCA Screening, Triage and Referral (STR)  Patient Reported Information How did you hear about [place]? Self  What Is the Reason for Your Visit/Call Today? Pt presents to [facility] voluntarily seeking substance use treatment. Pt reports she needs a hip replacement and she has to be clean in order for the doctor to complete the procedure. Pt states she would like assistance detox and substance use treatment. Pt reports using cocaine and alcohol. Pt reports using about $20 worth of cocaine a day, and 1-2, 12oz beers. Pt reports her last use was yesterday. Pt denies SI/HI and AVH.  How Long Has This Been Causing You Problems? > than 6 months  What Do You Feel Would Help You the Most Today? Alcohol or Drug Use Treatment   Have You Recently Had Any Thoughts About Hurting Yourself? No  Are You Planning to Commit Suicide/Harm Yourself At This time? No   Flowsheet Row ED from 08/31/2022 in [facility] Admission (Discharged) from 08/19/2022 in [place] [facility] from 08/07/2022 in [place] CATEGORY No Risk No Risk No Risk  Have you Recently Had Thoughts About [place]? No  Are You Planning to Harm Someone at This Time? No  Explanation: na  Have You Used Any Alcohol or Drugs in the Past 24 Hours? Yes  What Did You Use and How Much? $20 worth of cocaine a day, and 1-2, 12oz beers   Do You Currently Have a Therapist/Psychiatrist? No  Name of  Therapist/Psychiatrist: Name of Therapist/Psychiatrist: medications prescribd by PCP [place] Recently Discharged From Any Office Practice or Programs? No  Explanation of Discharge From Practice/Program: na     CCA Screening Triage Referral Assessment Type of Contact: Face-to-Face  Telemedicine Service Delivery:   Is this Initial or Reassessment?   Date Telepsych consult ordered in [facility]:    Time Telepsych consult ordered in [facility]:    Location of Assessment: [facility] Assessment Services  Provider Location: [facility] Assessment Services   Collateral Involvement: none   Does Patient Have a [place]? No  Legal Guardian Contact Information: na  Copy of Legal Guardianship Form: No - copy requested  Legal Guardian Notified of Arrival: -- (na)  Legal Guardian Notified of Pending Discharge: -- (na)  If Minor and Not Living with Parent(s), Who has Custody? adult  Is CPS involved or ever been involved? Never (none reported)  Is APS involved or ever been involved? Never (none reported)   Patient Determined To Be At Risk for Harm To Self or Others Based on Review of Patient Reported Information or Presenting Complaint? No  Method: No Plan  Availability of Means: No access or NA  Intent: Vague intent or NA  Notification Required: No need or identified person  Additional Information for Danger to Others Potential: -- (na)  Additional Comments for Danger to Others Potential: na  Are There Guns or Other Weapons in Your Home? No  Types of Guns/Weapons: na  Are These Weapons Safely Secured?                            -- (na)  Who Could Verify You Are Able To Have These Secured: na  Do You Have any Outstanding Charges, Pending Court Dates, Parole/Probation? none reported  Contacted To Inform of Risk of Harm To Self or Others: -- (na)    Does Patient Present under Involuntary Commitment? No    [place] of Residence:  [place]   Patient Currently Receiving the Following Services: Medication Management   Determination of Need: Urgent (48 hours) (Per Shuvon Rankin NP, pt is Urgent and recommended for CD-IOP)   Options For Referral: Chemical Dependency Intensive Outpatient Therapy (CDIOP); Medication Management     CCA Biopsychosocial Patient Reported Schizophrenia/Schizoaffective Diagnosis in Past: Yes   Strengths: Motivated towards treatment   Mental Health Symptoms Depression:   Hopelessness; Irritability; Tearfulness; Sleep (too much or little); Fatigue; Difficulty Concentrating   Duration of Depressive symptoms:  Duration of Depressive Symptoms: Greater than two weeks   Mania:   Irritability; Racing thoughts   Anxiety:    Sleep; Tension; Worrying; Fatigue   Psychosis:   None   Duration of Psychotic symptoms:  Duration of Psychotic Symptoms: Greater than six months   Trauma:   None   Obsessions:   None   Compulsions:   None   Inattention:   N/A   Hyperactivity/Impulsivity:   N/A   Oppositional/Defiant Behaviors:   N/A   Emotional Irregularity:   Mood lability  Other Mood/Personality Symptoms:   none    Mental Status Exam Appearance and self-care  Stature:   Small   Weight:   Thin   Clothing:   Neat/clean   Grooming:   Normal   Cosmetic use:   Age appropriate   Posture/gait:   Stooped (walking with a cane, limping)   Motor activity:   Slowed   Sensorium  Attention:   Normal   Concentration:   Variable; Normal   Orientation:   Object; Person; Place; Situation; Time; X5   Recall/memory:   Normal   Affect and Mood  Affect:   Depressed   Mood:   Depressed   Relating  Eye contact:   Normal   Facial expression:   Sad   Attitude toward examiner:   Cooperative   Thought and Language  Speech flow:  Clear and Coherent   Thought content:   Appropriate to Mood and Circumstances   Preoccupation:   None    Hallucinations:   None   Organization:   Intact   Transport planner of Knowledge:   Average   Intelligence:   Average   Abstraction:   Normal   Judgement:   Fair   Art therapist:   Adequate   Insight:   Fair   Decision Making:   Normal   Social Functioning  Social Maturity:   Irresponsible   Social Judgement:   "Street Smart"   Stress  Stressors:   Grief/losses   Coping Ability:   Deficient supports   Skill Deficits:   Self-control   Supports:   Friends/Service system; Family     Religion: Religion/Spirituality Are You A Religious Person?: Yes How Might This Affect Treatment?: unknown  Leisure/Recreation: Leisure / Recreation Do You Have Hobbies?: Yes Leisure and Hobbies: spending time with 4 grandchildren, playing with her new kitten  Exercise/Diet: Exercise/Diet Do You Exercise?: No Have You Gained or Lost A Significant Amount of Weight in the Past Six Months?: No Do You Follow a Special Diet?: No Do You Have Any Trouble Sleeping?: No Explanation of Sleeping Difficulties: stated sleeps about 6 hours a night   CCA Employment/Education Employment/Work Situation: Employment / Work Technical sales engineer: On disability (retired) Why is Patient on Disability: mental health concerns How Long has Patient Been on Disability: "years" Patient's Job has Been Impacted by Current Illness: No Has Patient ever Been in the [employer]?: No  Education: Education Is Patient Currently Attending School?: No Last Grade Completed: 12 (UT[employer]) Did You Attend College?: No Did You Have An Individualized Education Program (IIEP): No Did You Have Any Difficulty At School?: No   CCA Family/Childhood History Family and Relationship History: Family history Marital status: Divorced Divorced, when?: unknown What types of issues is patient dealing with in the relationship?: unknown Additional relationship information: na Does patient have  children?: Yes How many children?: 1 (adult daughter) How is patient's relationship with their children?: Good relationship with daughter.  Childhood History:  Childhood History By whom was/is the patient raised?: Mother (UTA) Did patient suffer any verbal/emotional/physical/sexual abuse as a child?: No Has patient ever been sexually abused/assaulted/raped as an adolescent or adult?: No Witnessed domestic violence?: No Has patient been affected by domestic violence as an adult?: No       CCA Substance Use Alcohol/Drug Use: Alcohol / Drug Use Pain Medications: See MAR Prescriptions: See MAR Over the Counter: See MAR History of alcohol / drug use?: Yes Longest period of sobriety (when/how long): 1 1/.2 yrs clean  at one point Negative Consequences of Use: Personal relationships, Financial Withdrawal Symptoms: None Substance #1 Name of Substance 1: crack cocaine 1 - Age of First Use: 81s 1 - Amount (size/oz): $20 1 - Frequency: 5-6 days of 7 1 - Duration: ongoing 1 - Last Use / Amount: yesterday 1 - Method of Aquiring: purchase 1- Route of Use: smoke Substance #2 Name of Substance 2: aalcohol 2 - Age of First Use: young adult 2 - Amount (size/oz): 2 cans of beet or small bottle of liquor 2 - Frequency: 3 x week 2 - Duration: ongoing 2 - Last Use / Amount: yesterday 2 - Method of Aquiring: purchase 2 - Route of Substance Use: oral                     ASAM's:  Six Dimensions of Multidimensional Assessment  Dimension 1:  Acute Intoxication and/or Withdrawal Potential:   Dimension 1:  Description of individual's past and current experiences of substance use and withdrawal: N/A - cocaine use, alcohol use  Dimension 2:  Biomedical Conditions and Complications:   Dimension 2:  Description of patient's biomedical conditions and  complications: limited due to age and some weakness  Dimension 3:  Emotional, Behavioral, or Cognitive Conditions and Complications:   Dimension 3:  Description of emotional, behavioral, or cognitive conditions and complications: stated diagnosed with Bipolar d/o  Dimension 4:  Readiness to Change:  Dimension 4:  Description of Readiness to Change criteria: Open to CD-IOP treatment  Dimension 5:  Relapse, Continued use, or Continued Problem Potential:  Dimension 5:  Relapse, continued use, or continued problem potential critiera description: Patient has done well with sobriety at points she has been stable on medications  Dimension 6:  Recovery/Living Environment:  Dimension 6:  Recovery/Iiving environment criteria description: Some support from daughter  ASAM Severity Score: ASAM's Severity Rating Score: 7  ASAM Recommended Level of Treatment: ASAM Recommended Level of Treatment: Level II Intensive Outpatient Treatment   Substance use Disorder (SUD) Substance Use Disorder (SUD)  Checklist Symptoms of Substance Use: Continued use despite having a persistent/recurrent physical/psychological problem caused/exacerbated by use, Continued use despite persistent or recurrent social, interpersonal problems, caused or exacerbated by use  Recommendations for Services/Supports/Treatments: Recommendations for Services/Supports/Treatments Recommendations For Services/Supports/Treatments: CD-IOP Intensive Chemical Dependency Program  Discharge Disposition:    DSM5 Diagnoses: Patient Active Problem List   Diagnosis Date Noted   Osteoarthritis of right hip 08/19/2022   Grief 01/23/2021   PTSD (post-traumatic stress disorder) 08/13/2020   Cocaine use disorder, severe, in controlled environment ([place]) 07/23/2020   Cocaine abuse with cocaine-induced mood disorder ([place]) 07/23/2020   Bacterial vaginosis 02/01/2018   Cocaine abuse ([place]) 02/01/2018   Pulmonary nodules 02/01/2018   Syncope 02/01/2018   Hip fracture, unspecified laterality, closed, initial encounter ([place]) 01/01/2017   Hip fracture ([place]) 01/01/2017   Displaced fracture of  left femoral neck ([place]) 01/01/2017   Schizoaffective disorder, bipolar type ([place]) 03/19/2015    Class: Chronic     Referrals to Alternative Service(s): Referred to Alternative Service(s):   Place:   Date:   Time:    Referred to Alternative Service(s):   Place:   Date:   Time:    Referred to Alternative Service(s):   Place:   Date:   Time:    Referred to Alternative Service(s):   Place:   Date:   Time:     Krisandra Bueno T, Counselor

## 2022-08-31 NOTE — ED Provider Notes (Signed)
Behavioral Health Urgent Care Medical Screening Exam  Patient Name: Terry Potter MRN: [id number] Date of Evaluation: 08/31/22 Chief Complaint:   Diagnosis:  Final diagnoses:  Cocaine use disorder, severe, in controlled environment ([place])  Schizoaffective disorder, bipolar type ([place])  PTSD (post-traumatic stress disorder)   History of Present illness: Terry Potter is a 67 y.o. female with history of schizoaffective disorder, bipolar type, PTSD, and cocaine use disorder presented to [facility] as a walk in requesting assistance with services for detox, rehab, or substance use outpatient services for cocaine use.    Terry Potter, 67 y.o., female patient seen face to face by this provider, consulted with Dr. Hampton Abbot; and chart reviewed on 08/31/22.  On evaluation Terry Potter reports she came in because she needs to get into a program to help with her cocaine use.  "I'm suppose to have hip surgery but it can't be done until I'm clean.  I'm in a place right now where I stay by my self and I don't have to depend on anybody and I don't have to be around that (cocaine) so I want to get services to help.  I don't care if it is inpatient or outpatient.  I just need something where I know there will be someone that will be checking up regularly."  Patient denies suicidal/self-harm/homicidal ideation, psychosis, and paranoia.  Reports she lives alone but able to do ADLS without assistance.  States that she doesn't have transportation but in the process of getting established with Medicaid Transport.  Reports one prior rehab and stayed clean for 1.5 yrs afterwards.  States that she doesn't have outpatient psychiatric services at this time.  Psychotropic medications managed by her primary care doctor.  Reports she is compliant with medications.  History of multiple psychiatric admission and last "about a year ago Oct 13, 2020 when my mother died and I was having a hard time with that."    During  evaluation Terry Potter is sitting in chair with no noted distress.  She is alert/oriented x 4, calm, cooperative, attentive, and responses were relevant and appropriate to assessment questions.  She spoke in a clear tone at moderate volume, and normal pace, with good eye contact.   She denies suicidal/self-harm/homicidal ideation, psychosis, and paranoia.  Objectively there is no evidence of psychosis/mania or delusional thinking.  She conversed coherently, with goal directed thoughts, and no distractibility, or pre-occupation.    At this time Terry Potter is educated and verbalizes understanding of mental health resources and other crisis services in the community. She is instructed to call 911 and present to the nearest emergency room should she experience any suicidal/homicidal ideation, auditory/visual/hallucinations, or detrimental worsening of her mental health condition.  She was a also advised by Probation officer that she could call the toll-free phone on back of  insurance and Medicaid to assist with identifying counselors and agencies in network or to speak with Medicaid care coordinator.    [place] ED from 08/31/2022 in [facility] Admission (Discharged) from 08/19/2022 in [place] [facility] from 08/07/2022 in [place] CATEGORY No Risk No Risk No Risk       Psychiatric Specialty Exam  Presentation  General Appearance:Appropriate for Environment  Eye Contact:Good  Speech:Clear and Coherent; Normal Rate  Speech Volume:Normal  Handedness:Right   Mood and Affect  Mood: Euthymic  Affect: Appropriate; Congruent   Thought Process  Thought Processes:  Coherent; Goal Directed  Descriptions of Associations:Intact  Orientation:Full (Time, Place and Person)  Thought Content:Logical  Diagnosis of Schizophrenia or Schizoaffective disorder in past: Yes   Duration of Psychotic Symptoms: Greater than six months  Hallucinations:None  Ideas of Reference:None  Suicidal Thoughts:No  Homicidal Thoughts:No   Sensorium  Memory: Immediate Good; Recent Good; Remote Good  Judgment: Intact  Insight: Present   Executive Functions  Concentration: Good  Attention Span: Good  Recall: Good  Fund of Knowledge: Good  Language: Good   Psychomotor Activity  Psychomotor Activity: Normal   Assets  Assets: Communication Skills; Desire for Improvement; Financial Resources/Insurance; Housing; Leisure Time; Resilience; Social Support   Sleep  Sleep: Good  Number of hours: No data recorded  Physical Exam: Physical Exam Vitals and nursing note reviewed. Exam conducted with a chaperone present.  Constitutional:      General: She is not in acute distress.    Appearance: Normal appearance. She is not ill-appearing.  HENT:     Head: Normocephalic.  Eyes:     Conjunctiva/sclera: Conjunctivae normal.  Cardiovascular:     Rate and Rhythm: Normal rate.  Pulmonary:     Effort: Pulmonary effort is normal. No respiratory distress.  Musculoskeletal:        General: Normal range of motion.     Cervical back: Normal range of motion.  Skin:    General: Skin is warm and dry.  Neurological:     Mental Status: She is alert and oriented to person, place, and time.  Psychiatric:        Attention and Perception: Attention and perception normal. She does not perceive auditory or visual hallucinations.        Mood and Affect: Mood and affect normal.        Speech: Speech normal.        Behavior: Behavior normal. Behavior is cooperative.        Thought Content: Thought content normal. Thought content is not paranoid or delusional. Thought content does not include homicidal or suicidal ideation.        Cognition and Memory: Cognition normal.        Judgment: Judgment normal.    Review of Systems  Musculoskeletal:  Positive for joint  pain (Right hip).       Reports she is suppose to have a hip replacement but needs to be clean prior to surgery   Psychiatric/Behavioral:  Negative for hallucinations. Depression: Stable. Substance abuse: Crack cocaine 4-5 days a week.  Beer "1 or 2 cans about every other day, but not every day.". Suicidal ideas: Denies.The patient does not have insomnia. Nervous/anxious: Stable.       Patient stating that she needs to get into a detox, rehab, or outpatient program for substance use.  States that she is suppose to have hip replacement surgery but it can't be done until she is clean.    All other systems reviewed and are negative.  Blood pressure 108/62, pulse 87, temperature 98 F (36.7 C), temperature source Oral, resp. rate 18, SpO2 100 %. There is no height or weight on file to calculate BMI.  Musculoskeletal: Strength & Muscle Tone: within normal limits Gait & Station:  Limp, using cane to assist with ambulation Patient leans: N/A   [place] MSE Discharge Disposition for Follow up and Recommendations: Based on my evaluation the patient does not appear to have an emergency medical condition and can be discharged with resources and follow up care in outpatient services for Substance  Abuse Intensive Outpatient Program and [name]   Follow-up [place].   Specialty: Urgent Care Why: A referral has been sent for the [name] program.  They will contact you to set up services.  If you do not hear anything by 09/01/2022 please call number above and ask for Steele Sizer, LCSW Contact information: [place] [phone]                  Discharge Instructions        [place]: Outpatient psychiatric Services  ([name]) Chemical Dependence Intensive Outpatient Program:  Also offered.  A referral has been sent to [facility], LCSW.  He will contact you with appointment.    New Patient Assessment and Therapy Walk-in Monday thru Thursday 8:00 am first come first serve until slots are full Every Friday from 1:00 pm to 4:00 pm first come first serve until slots are full  New Patient Psychiatric Medication Management Monday thru Friday from 8:00 am to 11:00 am first come first served until slots are full  For all walk-ins we ask that you arrive by 7:15 am because patients will be seen in there order of arrival.   Availability is limited, and therefore you may not be seen on the same day that you walk in.  Our goal is to serve and meet the needs of our community to the best of our ability.        SUBSTANCE USE TREATMENT for Medicaid/Medicare and State Funded/IPRS  [facility] [place], [place], [address] [phone] phone NOTE: [facility] is no longer offering IOP services.  Serves those who are low-income or have no insurance.  [facility] [address] [place], [place], [address] [phone] phone [phone] fax NOTE: Does have Substance Abuse-Intensive Outpatient Program [facility]) as well as transitional housing if eligible.  [place], [place], [address] [phone] phone [phone] fax  [facility] [phone] [place], [place], [address] [phone] phone [phone] fax       Earleen Newport, NP 08/31/2022, 12:05 PM

## 2022-08-31 NOTE — Discharge Instructions (Addendum)
   [place]: Outpatient psychiatric Services  ([name]) Chemical Dependence Intensive Outpatient Program:  Also offered.  A referral has been sent to [facility], LCSW.  He will contact you with appointment.   New Patient Assessment and Therapy Walk-in Monday thru Thursday 8:00 am first come first serve until slots are full Every Friday from 1:00 pm to 4:00 pm first come first serve until slots are full  New Patient Psychiatric Medication Management Monday thru Friday from 8:00 am to 11:00 am first come first served until slots are full  For all walk-ins we ask that you arrive by 7:15 am because patients will be seen in there order of arrival.   Availability is limited, and therefore you may not be seen on the same day that you walk in.  Our goal is to serve and meet the needs of our community to the best of our ability.        SUBSTANCE USE TREATMENT for Medicaid/Medicare and State Funded/IPRS  [facility] [place], [place], [address] [phone] phone NOTE: [facility] is no longer offering IOP services.  Serves those who are low-income or have no insurance.  [facility] [address] [place], [place], [address] [phone] phone [phone] fax NOTE: Does have Substance Abuse-Intensive Outpatient Program [facility]) as well as transitional housing if eligible.  [facility] [place], [place], [address] [phone] phone [phone] fax  [facility] [phone] [address] [place], [place], [address] [phone] phone [phone] fax

## 2022-09-02 ENCOUNTER — Ambulatory Visit (HOSPITAL_COMMUNITY): Payer: Medicare HMO | Admitting: Licensed Clinical Social Worker

## 2022-09-02 DIAGNOSIS — F1420 Cocaine dependence, uncomplicated: Secondary | ICD-10-CM

## 2022-09-02 DIAGNOSIS — F4310 Post-traumatic stress disorder, unspecified: Secondary | ICD-10-CM

## 2022-09-02 DIAGNOSIS — F250 Schizoaffective disorder, bipolar type: Secondary | ICD-10-CM
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
## 2022-09-03 ENCOUNTER — Telehealth (HOSPITAL_COMMUNITY): Payer: Self-pay | Admitting: Licensed Clinical Social Worker

## 2022-09-03 NOTE — Telephone Encounter (Signed)
The therapist receives a telephone call from the Surgical Scheduling Nurse, Marianna Fuss, with Dr. Sid Falcon office with the therapist having her provide Terry Potter's full name and date of birth.   She says that Kiel was scheduled for surgery on 06/19/22; however, she tested positive for cocaine. Thus, the surgery was rescheduled until March 6th when she again tested positive for cocaine. Thus, Dr. Lyla Glassing has indicated that he will not reschedule her again until she has had treatment and is negative for cocaine with his next appointment being in mid-May.   Marianna Fuss says that based on her experience with Nhyira that she is strongly in agreement that Aarnavi needs inpatient substance use residential treatment and then discharge to a sober living situation if she is to be successful. She states her belief that if Evelia is in outpatient services and able to return to her home that she will be unable to not use.   The therapist informs her that he will contact Pamala Hurry regarding treatment recommendations while disclosing his belief that it could present a challenge to find an inpatient residential treatment facility agreeable to take her due to her age and not being fully ambulatory.  Marianna Fuss asks that the therapist call her back at [phone] if an inpatient treatment facility is located.  Adam Phenix, [place], LCSW, [facility], [place] 09/03/2022

## 2022-09-03 NOTE — Telephone Encounter (Signed)
The therapist calls Terry Potter confirming her identity via two identifiers and sharing the consensus of the [name] therapist and P.A. in addition to her Surgeon's office that the recommended level of care is inpatient, substance use residential treatment with a plan to step down to sober living.   The therapist informs Terry Potter that the first step is calling her [facility] insurance to see if she can get a Behavioral Health Case Manager to help her locate an in-network residential treatment facility that would take her.   She indicates that she is unable to do this without assistance. Thus, the therapist agrees to meet with her on 09/07/22 at 3 p.m. so he can call her insurance carrier with her present.  Adam Phenix, [place], LCSW, [facility], [place] 09/03/2022

## 2022-09-07 ENCOUNTER — Telehealth (HOSPITAL_COMMUNITY): Payer: Self-pay | Admitting: Licensed Clinical Social Worker

## 2022-09-07 ENCOUNTER — Ambulatory Visit (INDEPENDENT_AMBULATORY_CARE_PROVIDER_SITE_OTHER): Payer: Medicare HMO | Admitting: Licensed Clinical Social Worker

## 2022-09-07 DIAGNOSIS — F4310 Post-traumatic stress disorder, unspecified: Secondary | ICD-10-CM

## 2022-09-07 DIAGNOSIS — F250 Schizoaffective disorder, bipolar type: Secondary | ICD-10-CM

## 2022-09-07 DIAGNOSIS — F1420 Cocaine dependence, uncomplicated: Secondary | ICD-10-CM

## 2022-09-07 NOTE — Progress Notes (Signed)
The therapist meets with Terry Potter and calls her [name] asking if Terry Potter has behavioral health case management as part of her benefit to assist her in getting into an in-network, substance-use residential treatment facility with her mobility issues. The Health Concierge puts in a referral for her to be assigned to a [place] saying that she will receive a call from this person within 24 hours.   Terry Potter says that she had a deep depression one day over the weekend having thoughts of suicide with no intent. The therapist answers her question concerning what a "crisis" is and talks to her about the availability of the 24/7 urgent care at the [facility]. Terry Potter understands that if she were in a crisis and not feeling safe that she could call 911 and get transport to the [facility].   She says that she will await the call from the [place].   [address] [place], LCSW, [facility], [place] 09/07/2022

## 2022-09-07 NOTE — Telephone Encounter (Signed)
The therapist returns Terry Potter's call leaving a HIPAA-compliant voicemail.  Adam Phenix, [place], LCSW, [facility] [place] 09/07/2022

## 2022-09-07 NOTE — Telephone Encounter (Signed)
The therapist returns Terry Potter's call leaving another, HIPAA-compliant voicemail.  Adam Phenix, [place], LCSW, [facility], [place] 09/07/2022

## 2022-09-07 NOTE — Telephone Encounter (Signed)
The therapist receives a call from [place] verifying her identity via two identifiers. She asks about the person who is to call her with the therapist explaining that the person who will be calling her is the assigned Case Manager and that this person is to assist her in getting linked to inpatient residential treatment. As Terry Potter seems concerned that she will have to leave and go somewhere on a moment's notice, the therapist reassures her that this is not likely to happen.  Adam Phenix, [place], LCSW, [facility], [place] 09/07/2022

## 2022-09-09 ENCOUNTER — Emergency Department (HOSPITAL_COMMUNITY)
Admission: EM | Admit: 2022-09-09 | Discharge: 2022-09-09 | Disposition: A | Discharge status: Home / Self Care | Payer: Medicare HMO | Attending: Emergency Medicine | Admitting: Emergency Medicine

## 2022-09-09 ENCOUNTER — Emergency Department (HOSPITAL_COMMUNITY): Payer: Medicare HMO

## 2022-09-09 ENCOUNTER — Other Ambulatory Visit: Payer: Self-pay

## 2022-09-09 DIAGNOSIS — Z7982 Long term (current) use of aspirin: Secondary | ICD-10-CM | POA: Diagnosis not present

## 2022-09-09 DIAGNOSIS — G8929 Other chronic pain: Secondary | ICD-10-CM | POA: Insufficient documentation

## 2022-09-09 DIAGNOSIS — M25551 Pain in right hip: Secondary | ICD-10-CM | POA: Diagnosis not present

## 2022-09-09 MED ORDER — KETOROLAC TROMETHAMINE 60 MG/2ML IM SOLN
60.0000 mg | Freq: Once | INTRAMUSCULAR | Status: AC
  Administered 2022-09-09: 60 mg via INTRAMUSCULAR
  Filled 2022-09-09: qty 2

## 2022-09-09 MED ORDER — HYDROCODONE-ACETAMINOPHEN 5-325 MG PO TABS: ORAL_TABLET | ORAL | 0 refills | Status: DC

## 2022-09-09 MED ORDER — MELOXICAM 7.5 MG PO TABS: 7.5000 mg | ORAL_TABLET | Freq: Every day | ORAL | 0 refills | Status: DC

## 2022-09-09 NOTE — Discharge Instructions (Addendum)
Return if any problems.  Follow up with Dr. Lillia Corporal.

## 2022-09-09 NOTE — ED Triage Notes (Signed)
Pt to the ed from health clinic via ems with a CC right hip pain x years. Pt was seen at clinic this morning for hip pain. Pt has been clean to get a new hip replacement but can not get the surgery until she has addressed  her drug addiction. Pt relays right sided hip, is able to ambulate with a cane. Denies any recent trauma.

## 2022-09-09 NOTE — ED Provider Notes (Addendum)
[facility] Provider Note   CSN: [id number] Arrival date & time: 09/09/22  1043     History  Chief Complaint  Patient presents with   Hip Pain    Terry Potter is a 67 y.o. female.  Patient reports she has a substance abuse disorder.  Patient has chronic right hip pain and is supposed to have surgery.  Patient reports she sees Dr. Lyla Glassing.  Patient states Dr. Lyla Glassing will not do surgery until she has stopped using cocaine.  Patient reports she last used on Monday.  Patient reports chronic problems with depression.  Patient reports depression since her mother died and she has a strained relationship with her daughter.  Patient reports that she rolled out of bed on Monday.  Patient reports more pain in her hip since she rolled out of bed.  Pt  is being evaluated by behavioral health to go into a residential treatment program  The history is provided by the patient. No language interpreter was used.  Hip Pain This is a recurrent problem. The current episode started more than 1 week ago. The problem occurs constantly. The symptoms are aggravated by walking. Nothing relieves the symptoms. She has tried nothing for the symptoms. The treatment provided no relief.       Home Medications Prior to Admission medications   Medication Sig Start Date End Date Taking? Authorizing Provider  acetaminophen (TYLENOL) 500 MG tablet Take 1,000 mg by mouth every 6 (six) hours as needed for moderate pain.    [provider]  albuterol (PROAIR HFA) 108 (90 Base) MCG/ACT inhaler Inhale 2 puffs into the lungs every 6 (six) hours as needed for wheezing or shortness of breath. 10/03/19   Rancour, Annie Main, MD  ALLERGY RELIEF 10 MG tablet Take 10 mg by mouth daily. 01/01/21   [provider]  aspirin EC 325 MG EC tablet Take 1 tablet (325 mg total) by mouth 2 (two) times daily with a meal. 01/03/17   [place], Museum/gallery conservator, PA-C  budesonide-formoterol  (SYMBICORT) 160-4.5 MCG/ACT inhaler Inhale 2 puffs into the lungs daily as needed (Asthma).    [provider]  dextromethorphan 15 MG/5ML syrup Take 10 mLs by mouth 4 (four) times daily as needed for cough.    [provider]  fluticasone (FLONASE) 50 MCG/ACT nasal spray Place 1 spray into both nostrils 2 (two) times daily.    [provider]  gabapentin (NEURONTIN) 300 MG capsule Take 1 capsule (300 mg total) by mouth 3 (three) times daily. 04/28/21   Salley Slaughter, NP  ibuprofen (ADVIL) 800 MG tablet Take 1 tablet (800 mg total) by mouth 3 (three) times daily. Patient not taking: Reported on 08/04/2022 02/22/22   Dion Saucier A, PA  lidocaine (LIDODERM) 5 % Place 1 patch onto the skin daily. Remove & Discard patch within 12 hours or as directed by MD Patient not taking: Reported on 06/19/2022 02/22/22   Wilnette Kales, PA  Menthol-Methyl Salicylate (BENGAY GREASELESS EX) Apply 1 application  topically daily as needed (body pain).    [provider]  mirtazapine (REMERON) 15 MG tablet Take 1 tablet (15 mg total) by mouth at bedtime. 04/28/21   Salley Slaughter, NP  naproxen (NAPROSYN) 500 MG tablet Take 500 mg by mouth 2 (two) times daily as needed for mild pain or moderate pain. 07/20/22   [provider]  Oxcarbazepine (TRILEPTAL) 300 MG tablet Take 1 tablet (300 mg total) by mouth  3 (three) times daily. 04/28/21   Eulis Canner E, NP  prazosin (MINIPRESS) 2 MG capsule TAKE 1 CAPSULE (2 MG TOTAL) BY MOUTH AT BEDTIME. Patient not taking: Reported on 06/19/2022 04/28/21   Salley Slaughter, NP  risperiDONE (RISPERDAL) 1 MG tablet Take 1 tablet (1 mg total) by mouth 2 (two) times daily. 04/28/21   Salley Slaughter, NP      Allergies    Tetracyclines & related    Review of Systems   Review of Systems  All other systems reviewed and are negative.   Physical Exam Updated Vital Signs BP 116/72 (BP Location: Right Arm)   Pulse 65    Temp 98.5 F (36.9 C) (Oral)   Resp 16   Ht 5\' 2"  (1.575 m)   Wt 61 kg   SpO2 99%   BMI 24.60 kg/m  Physical Exam Vitals and nursing note reviewed.  Constitutional:      Appearance: She is well-developed.  HENT:     Head: Normocephalic.  Cardiovascular:     Rate and Rhythm: Normal rate.  Pulmonary:     Effort: Pulmonary effort is normal.  Abdominal:     General: There is no distension.  Musculoskeletal:        General: No swelling or deformity.     Cervical back: Normal range of motion.  Skin:    General: Skin is warm.  Neurological:     General: No focal deficit present.     Mental Status: She is alert and oriented to person, place, and time.  Psychiatric:        Mood and Affect: Mood normal.    ED Results / Procedures / Treatments   Labs (all labs ordered are listed, but only abnormal results are displayed) Labs Reviewed - No data to display  EKG None  Radiology DG Hip Unilat W or Wo Pelvis 2-3 Views Right  Result Date: 09/09/2022 CLINICAL DATA:  Fall, chronic right hip pain. EXAM: DG HIP (WITH OR WITHOUT PELVIS) 2-3V RIGHT COMPARISON:  Hip radiographs 02/22/2022. FINDINGS: There is no evidence of acute fracture or dislocation. There is severe right femoroacetabular joint space narrowing with associated subchondral sclerosis and subchondral cystic change. There is flattening of the femoral head with underlying lucency raising suspicion for avascular necrosis. These findings are similar to the prior study. Left hip arthroplasty hardware is again noted, without evidence of complication to the level imaged. The SI joints and symphysis pubis are intact. IMPRESSION: 1. No evidence of acute injury in the right hip. 2. Severe right femoroacetabular joint space narrowing with flattening of the femoral head and underlying lucency suggesting a vascular necrosis, similar to the prior study Electronically Signed   By: Valetta Mole M.D.   On: 09/09/2022 12:10     Procedures Procedures    Medications Ordered in ED Medications  ketorolac (TORADOL) injection 60 mg (has no administration in time range)    ED Course/ Medical Decision Making/ A&P                             Medical Decision Making Pt complains of increasing right hip pain.    Amount and/or Complexity of Data Reviewed Radiology: ordered and independent interpretation performed. Decision-making details documented in ED Course.    Details: Xray right hip shows chronic changes  no acute   Risk Prescription drug management. Risk Details: I will try patient on meloxicam to help with discomfort.  Patient given a limited course of hydrocodone.  10 tablets..  Patient is advised to abstain from cocaine abuse so that she can have her surgery.           Final Clinical Impression(s) / ED Diagnoses Final diagnoses:  Chronic hip pain, right    Rx / DC Orders ED Discharge Orders          Ordered    meloxicam (MOBIC) 7.5 MG tablet  Daily        09/09/22 1236    HYDROcodone-acetaminophen (NORCO/VICODIN) 5-325 MG tablet        09/09/22 1240           An After Visit Summary was printed and given to the patient.    Fransico Meadow, PA-C 09/09/22 1241    Fransico Meadow, PA-C 09/09/22 1450    Malvin Johns, MD 09/09/22 [phone]

## 2022-09-20 ENCOUNTER — Emergency Department (HOSPITAL_COMMUNITY)
Admission: EM | Admit: 2022-09-20 | Discharge: 2022-09-23 | Disposition: A | Discharge status: Other Acute Inpatient Hospital | Payer: Medicare HMO | Attending: Emergency Medicine | Admitting: Emergency Medicine

## 2022-09-20 ENCOUNTER — Other Ambulatory Visit: Payer: Self-pay

## 2022-09-20 DIAGNOSIS — Z1152 Encounter for screening for COVID-19: Secondary | ICD-10-CM | POA: Insufficient documentation

## 2022-09-20 DIAGNOSIS — F17290 Nicotine dependence, other tobacco product, uncomplicated: Secondary | ICD-10-CM | POA: Diagnosis not present

## 2022-09-20 DIAGNOSIS — R45851 Suicidal ideations: Secondary | ICD-10-CM | POA: Diagnosis not present

## 2022-09-20 DIAGNOSIS — T50902A Poisoning by unspecified drugs, medicaments and biological substances, intentional self-harm, initial encounter: Secondary | ICD-10-CM | POA: Diagnosis present

## 2022-09-20 DIAGNOSIS — J45909 Unspecified asthma, uncomplicated: Secondary | ICD-10-CM | POA: Insufficient documentation

## 2022-09-20 DIAGNOSIS — F250 Schizoaffective disorder, bipolar type: Secondary | ICD-10-CM | POA: Diagnosis not present

## 2022-09-20 DIAGNOSIS — F1414 Cocaine abuse with cocaine-induced mood disorder: Secondary | ICD-10-CM | POA: Insufficient documentation

## 2022-09-20 DIAGNOSIS — Z96642 Presence of left artificial hip joint: Secondary | ICD-10-CM | POA: Insufficient documentation

## 2022-09-20 NOTE — ED Triage Notes (Signed)
Patient called 911 to be evaluated for SI.patient states she intentionally took 2 each of all her prescribed medicine. Patient ax4.bp 130/78 hr 54.97% ra glu102

## 2022-09-21 ENCOUNTER — Encounter (HOSPITAL_COMMUNITY): Payer: Self-pay

## 2022-09-21 DIAGNOSIS — F1414 Cocaine abuse with cocaine-induced mood disorder: Secondary | ICD-10-CM | POA: Diagnosis not present

## 2022-09-21 LAB — SALICYLATE LEVEL: Salicylate Lvl: 7.4 mg/dL (ref 7.0–30.0)

## 2022-09-21 LAB — COMPREHENSIVE METABOLIC PANEL
ALT: 10 U/L (ref 0–44)
AST: 20 U/L (ref 15–41)
Albumin: 3.5 g/dL (ref 3.5–5.0)
Alkaline Phosphatase: 80 U/L (ref 38–126)
Anion gap: 14 (ref 5–15)
BUN: 19 mg/dL (ref 8–23)
CO2: 21 mmol/L — ABNORMAL LOW (ref 22–32)
Calcium: 8.9 mg/dL (ref 8.9–10.3)
Chloride: 102 mmol/L (ref 98–111)
Creatinine, Ser: 0.8 mg/dL (ref 0.44–1.00)
GFR, Estimated: >60 mL/min (ref >60)
Glucose, Bld: 112 mg/dL — ABNORMAL HIGH (ref 70–99)
Potassium: 3.3 mmol/L — ABNORMAL LOW (ref 3.5–5.1)
Sodium: 137 mmol/L (ref 135–145)
Total Bilirubin: 0.4 mg/dL (ref 0.3–1.2)
Total Protein: 6.8 g/dL (ref 6.5–8.1)

## 2022-09-21 LAB — CBC
HCT: 39.7 % (ref 36.0–46.0)
Hemoglobin: 13.3 g/dL (ref 12.0–15.0)
MCH: 32.5 pg (ref 26.0–34.0)
MCHC: 33.5 g/dL (ref 30.0–36.0)
MCV: 97.1 fL (ref 80.0–100.0)
Platelets: 205 10*3/uL (ref 150–400)
RBC: 4.09 MIL/uL (ref 3.87–5.11)
RDW: 14.2 % (ref 11.5–15.5)
WBC: 5.5 10*3/uL (ref 4.0–10.5)
nRBC: 0 % (ref 0.0–0.2)

## 2022-09-21 LAB — RAPID URINE DRUG SCREEN, HOSP PERFORMED
Amphetamines: NOT DETECTED
Barbiturates: NOT DETECTED
Benzodiazepines: NOT DETECTED
Cocaine: POSITIVE — AB
Opiates: POSITIVE — AB
Tetrahydrocannabinol: NOT DETECTED

## 2022-09-21 LAB — CBG MONITORING, ED: Glucose-Capillary: 111 mg/dL — ABNORMAL HIGH (ref 70–99)

## 2022-09-21 LAB — ETHANOL: Alcohol, Ethyl (B): <10 mg/dL (ref <10)

## 2022-09-21 LAB — ACETAMINOPHEN LEVEL: Acetaminophen (Tylenol), Serum: 22 ug/mL (ref 10–30)

## 2022-09-21 LAB — SARS CORONAVIRUS 2 BY RT PCR: SARS Coronavirus 2 by RT PCR: NEGATIVE

## 2022-09-21 MED ORDER — GABAPENTIN 300 MG PO CAPS
300.0000 mg | ORAL_CAPSULE | Freq: Three times a day (TID) | ORAL | Status: DC
  Administered 2022-09-21 – 2022-09-23 (×8): 300 mg via ORAL
  Filled 2022-09-21 (×8): qty 1

## 2022-09-21 MED ORDER — LORATADINE 10 MG PO TABS
10.0000 mg | ORAL_TABLET | Freq: Every day | ORAL | Status: DC
  Administered 2022-09-21 – 2022-09-23 (×3): 10 mg via ORAL
  Filled 2022-09-21 (×3): qty 1

## 2022-09-21 MED ORDER — MIRTAZAPINE 15 MG PO TABS
15.0000 mg | ORAL_TABLET | Freq: Every day | ORAL | Status: DC
  Administered 2022-09-21 – 2022-09-22 (×2): 15 mg via ORAL
  Filled 2022-09-21 (×2): qty 1

## 2022-09-21 MED ORDER — NAPROXEN 250 MG PO TABS
500.0000 mg | ORAL_TABLET | Freq: Two times a day (BID) | ORAL | Status: DC | PRN
  Administered 2022-09-22 – 2022-09-23 (×2): 500 mg via ORAL
  Filled 2022-09-21 (×3): qty 2

## 2022-09-21 MED ORDER — RISPERIDONE 1 MG PO TABS
1.0000 mg | ORAL_TABLET | Freq: Two times a day (BID) | ORAL | Status: DC
  Administered 2022-09-21 – 2022-09-23 (×4): 1 mg via ORAL
  Filled 2022-09-21 (×5): qty 1

## 2022-09-21 MED ORDER — POTASSIUM CHLORIDE CRYS ER 20 MEQ PO TBCR
40.0000 meq | EXTENDED_RELEASE_TABLET | Freq: Two times a day (BID) | ORAL | Status: AC
  Administered 2022-09-21 – 2022-09-23 (×6): 40 meq via ORAL
  Filled 2022-09-21 (×7): qty 2

## 2022-09-21 MED ORDER — OXCARBAZEPINE 300 MG PO TABS
300.0000 mg | ORAL_TABLET | Freq: Three times a day (TID) | ORAL | Status: DC
  Administered 2022-09-21 – 2022-09-23 (×6): 300 mg via ORAL
  Filled 2022-09-21 (×7): qty 1

## 2022-09-21 MED ORDER — NICOTINE 21 MG/24HR TD PT24
21.0000 mg | MEDICATED_PATCH | Freq: Every day | TRANSDERMAL | Status: DC
  Administered 2022-09-21 – 2022-09-23 (×3): 21 mg via TRANSDERMAL
  Filled 2022-09-21 (×3): qty 1

## 2022-09-21 MED ORDER — ALBUTEROL SULFATE HFA 108 (90 BASE) MCG/ACT IN AERS
2.0000 | INHALATION_SPRAY | Freq: Four times a day (QID) | RESPIRATORY_TRACT | Status: DC | PRN
  Administered 2022-09-22 – 2022-09-23 (×3): 2 via RESPIRATORY_TRACT
  Filled 2022-09-21: qty 6.7

## 2022-09-21 NOTE — ED Notes (Addendum)
3 bags placed in [employer] [facility]  Home meds sent to pharmacy  Valuables sent to security  Folder given to secretary in [place] [place]

## 2022-09-21 NOTE — Consult Note (Signed)
BH ED ASSESSMENT   Reason for Consult:  SI Referring Physician:  Mesner Patient Identification: Terry Potter MRN:  [id number] ED Chief Complaint: Cocaine abuse with cocaine-induced mood disorder  Diagnosis:  Principal Problem:   Cocaine abuse with cocaine-induced mood disorder Active Problems:   Schizoaffective disorder, bipolar type   ED Assessment Time Calculation: Start Time: 0800 Stop Time: 0900 Total Time in Minutes (Assessment Completion): 60   HPI:   Terry Potter is a 67 y.o. female  who presents unaccompanied to [facility] ED via EMS after stating she attempted suicide by overdosing on medications. Pt says she is uncertain how many of her prescription medications she took and note from triage RN states she intentionally took 2 each of all her prescribed medicines. Pt says she feels had chronic pain in her right hip but the surgery has been repeatedly been postponed because Pt cannot stop using cocaine. She says she is addicted to crack and that her use is "out of control." She endorses chronic auditory hallucinations that berate her and tell her to kill herself.   Subjective:   Patient seen this morning at [facility] for face to face psychiatric evaluation. She is alert and oriented x4. She is pleasant during assessment, however reports being tired and does fall asleep a few times through out assessment. Her answers were minimal.   She confirms HPI above, stating she has been living alone in a boarding house, and has been very lonely/isolating recently. She confirms hx of schizoaffective disorder, chronic auditory hallucinations, and previous depression/suicide attempts. She states last hospitalization was at [facility] around 3 years ago. She denies current follow up psychiatric care. She reports noncompliance with medication, stating she "tries to take it daily but forgets sometimes."   Pt mentions that she attempted to overdose yesterday by taking 2 pills of each of her  prescribed medications. She continues to endorse suicidal ideations, with a plan to overdose on drugs or prescribed medications. She does endorse almost daily crack cocaine use, and occasional alcohol use. Denies other illicit substances. She endorses chronic auditory hallucinations that tell her to kill herself or say "rude things about her." Today she says they are "minimal and not saying anything specific." She states the AH worsen at night and with drug use. She denies homicidal ideations. Denies visual hallucinations.   She does report stressors of loneliness, drug addiction, isolation, low self esteem, chronic hip pain, and overall depressive symptoms. She is unable to contract for safety at this time. She wants to continue her prescribed medications, feels like they work well for her when she consistently takes them and no drug use. Will continue medications, will recommend for inpatient psychiatric treatment. PT being reviewed at [facility] for gero IP bed.   Past Psychiatric History:  See above  Risk to Self or Others: Is the patient at risk to self? Yes Has the patient been a risk to self in the past 6 months? No Has the patient been a risk to self within the distant past? Yes Is the patient a risk to others? No Has the patient been a risk to others in the past 6 months? No Has the patient been a risk to others within the distant past? No  [place] Scale:  Flowsheet Row ED from 09/20/2022 in [facility] Emergency Department at [facility] ED from 09/09/2022 in [facility] Emergency Department at [facility] ED from 08/31/2022 in [facility]  C-SSRS RISK CATEGORY High  Risk No Risk No Risk       ASAM: ASAM Multidimensional Assessment Summary Dimension 1:  Description of individual's past and current experiences of substance use and withdrawal: Pt reports almost daily cocaine use DImension 1:  Acute Intoxication and/or Withdrawal Potential Severity  Rating: None Dimension 2:  Description of patient's biomedical conditions and  complications: limited due to age and some weakness Dimension 2:  Biomedical Conditions and Complications Severity Rating: Mild Dimension 3:  Description of emotional, behavioral, or cognitive conditions and complications: Schizoaffective disorder, bipolar type, current episode depressed, severe Dimension 3:  Emotional, behavioral or cognitive (EBC) conditions and complications severity rating: Severe Dimension 4:  Description of Readiness to Change criteria: Says she wants to stop using cocaine Dimension 4:  Readiness to Change Severity Rating: Moderate Dimension 5:  Relapse, continued use, or continued problem potential critiera description: Patient has done well with sobriety at points she has been stable on medications Dimension 5:  Relapse, continued use, or continued problem potential severity rating: Moderate Dimension 6:  Recovery/Iiving environment criteria description: Lives alone Dimension 6:  Recovery/living environment severity rating: Moderate ASAM's Severity Rating Score: 10 ASAM Recommended Level of Treatment: Level II Intensive Outpatient Treatment  Substance Abuse:  Alcohol / Drug Use Pain Medications: Pt says she is prescribed pain medication Prescriptions: Denies abuse Over the Counter: Denies abuse History of alcohol / drug use?: Yes Longest period of sobriety (when/how long): 1 1/.2 yrs clean at one point Negative Consequences of Use: Personal relationships, Financial Withdrawal Symptoms: None  Past Medical History:  Past Medical History:  Diagnosis Date   Abnormal EKG    Arthritis    Asthma    Bipolar disorder    Ectopic pregnancy    Hip pain    Marijuana use    Protein-calorie malnutrition, severe    Shoulder pain    Tobacco dependence    cocaine ,Marijuana    Past Surgical History:  Procedure Laterality Date   LAPAROSCOPY FOR ECTOPIC PREGNANCY     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   Procedure: TOTAL HIP ARTHROPLASTY ANTERIOR APPROACH;  Surgeon: Samson Frederic, MD;  Location: [facility] OR;  Service: Orthopedics;  Laterality: Left;   TUMOR REMOVAL  1990   Uterine tumor removed   Family History:  Family History  Problem Relation Age of Onset   Cancer Mother    Social History:  Social History   Substance and Sexual Activity  Alcohol Use Yes   Alcohol/week: 2.0 standard drinks of alcohol   Types: 2 Shots of liquor per week   Comment: occ     Social History   Substance and Sexual Activity  Drug Use Yes   Types: Cocaine   Comment: occ, last dose 08/01/22, pt reports less than monthly use    Social History   Socioeconomic History   Marital status: Divorced    Spouse name: Not on file   Number of children: Not on file   Years of education: Not on file   Highest education level: Not on file  Occupational History   Not on file  Tobacco Use   Smoking status: Every Day    Packs/day: .25    Types: Cigarettes, Cigars   Smokeless tobacco: Never  Vaping Use   Vaping Use: Never used  Substance and Sexual Activity   Alcohol use: Yes    Alcohol/week: 2.0 standard drinks of alcohol    Types: 2 Shots of liquor per week    Comment: occ   Drug  use: Yes    Types: Cocaine    Comment: occ, last dose 08/01/22, pt reports less than monthly use   Sexual activity: Not on file  Other Topics Concern   Not on file  Social History Narrative   Not on file   Social Determinants of Health   Financial Resource Strain: Not on file  Food Insecurity: Not on file  Transportation Needs: Not on file  Physical Activity: Not on file  Stress: Not on file  Social Connections: Not on file   Allergies:   Allergies  Allergen Reactions   Tetracyclines & Related Hives    Labs:  Results for orders placed or performed during the hospital encounter of 09/20/22 (from the past 48 hour(s))  Comprehensive metabolic panel     Status: Abnormal   Collection Time: 09/21/22  12:18 AM  Result Value Ref Range   Sodium 137 135 - 145 mmol/L   Potassium 3.3 (L) 3.5 - 5.1 mmol/L   Chloride 102 98 - 111 mmol/L   CO2 21 (L) 22 - 32 mmol/L   Glucose, Bld 112 (H) 70 - 99 mg/dL    Comment: Glucose reference range applies only to samples taken after fasting for at least 8 hours.   BUN 19 8 - 23 mg/dL   Creatinine, Ser 1.61 0.44 - 1.00 mg/dL   Calcium 8.9 8.9 - 09.6 mg/dL   Total Protein 6.8 6.5 - 8.1 g/dL   Albumin 3.5 3.5 - 5.0 g/dL   AST 20 15 - 41 U/L   ALT 10 0 - 44 U/L   Alkaline Phosphatase 80 38 - 126 U/L   Total Bilirubin 0.4 0.3 - 1.2 mg/dL   GFR, Estimated >04 >54 mL/min    Comment: (NOTE) Calculated using the CKD-EPI Creatinine Equation (2021)    Anion gap 14 5 - 15    Comment: Performed at [facility], [address], [place], [place] [address]  Ethanol     Status: None   Collection Time: 09/21/22 12:18 AM  Result Value Ref Range   Alcohol, Ethyl (B) <10 <10 mg/dL    Comment: (NOTE) Lowest detectable limit for serum alcohol is 10 mg/dL.  For medical purposes only. Performed at [facility], [address], [place], [place] [address]   Salicylate level     Status: None   Collection Time: 09/21/22 12:18 AM  Result Value Ref Range   Salicylate Lvl 7.4 7.0 - 30.0 mg/dL    Comment: Performed at [facility], [address] [place], [place] [address]  Acetaminophen level     Status: None   Collection Time: 09/21/22 12:18 AM  Result Value Ref Range   Acetaminophen (Tylenol), Serum 22 10 - 30 ug/mL    Comment: (NOTE) Therapeutic concentrations vary significantly. A range of 10-30 ug/mL  may be an effective concentration for many patients. However, some  are best treated at concentrations outside of this range. Acetaminophen concentrations >150 ug/mL at 4 hours after ingestion  and >50 ug/mL at 12 hours after ingestion are often associated with  toxic reactions.  Performed at [facility], [address],  [place], [place] [address]   cbc     Status: None   Collection Time: 09/21/22 12:18 AM  Result Value Ref Range   WBC 5.5 4.0 - 10.5 K/uL   RBC 4.09 3.87 - 5.11 MIL/uL   Hemoglobin 13.3 12.0 - 15.0 g/dL   HCT 57.8 46.9 - 62.9 %  MCV 97.1 80.0 - 100.0 fL   MCH 32.5 26.0 - 34.0 pg   MCHC 33.5 30.0 - 36.0 g/dL   RDW 16.114.2 09.611.5 - 04.515.5 %   Platelets 205 150 - 400 K/uL   nRBC 0.0 0.0 - 0.2 %    Comment: Performed at [facility], [address] [place], [place] [address]  CBG monitoring, ED     Status: Abnormal   Collection Time: 09/21/22 12:22 AM  Result Value Ref Range   Glucose-Capillary 111 (H) 70 - 99 mg/dL    Comment: Glucose reference range applies only to samples taken after fasting for at least 8 hours.  Rapid urine drug screen (hospital performed)     Status: Abnormal   Collection Time: 09/21/22  1:19 AM  Result Value Ref Range   Opiates POSITIVE (A) NONE DETECTED   Cocaine POSITIVE (A) NONE DETECTED   Benzodiazepines NONE DETECTED NONE DETECTED   Amphetamines NONE DETECTED NONE DETECTED   Tetrahydrocannabinol NONE DETECTED NONE DETECTED   Barbiturates NONE DETECTED NONE DETECTED    Comment: (NOTE) DRUG SCREEN FOR MEDICAL PURPOSES ONLY.  IF CONFIRMATION IS NEEDED FOR ANY PURPOSE, NOTIFY LAB WITHIN 5 DAYS.  LOWEST DETECTABLE LIMITS FOR URINE DRUG SCREEN Drug Class                     Cutoff (ng/mL) Amphetamine and metabolites    1000 Barbiturate and metabolites    200 Benzodiazepine                 200 Opiates and metabolites        300 Cocaine and metabolites        300 THC                            50 Performed at [facility], [address] [place], [place] [address]     Current Facility-Administered Medications  Medication Dose Route Frequency Provider Last Rate Last Admin   albuterol (VENTOLIN HFA) 108 (90 Base) MCG/ACT inhaler 2 puff  2 puff Inhalation Q6H PRN Mesner, Teliyah CowerJason, MD       gabapentin (NEURONTIN) capsule 300 mg  300 mg Oral TID Mesner,  Patsie CowerJason, MD       loratadine (CLARITIN) tablet 10 mg  10 mg Oral Daily Mesner, Soni CowerJason, MD       mirtazapine (REMERON) tablet 15 mg  15 mg Oral QHS Mesner, Ivelise CowerJason, MD       naproxen (NAPROSYN) tablet 500 mg  500 mg Oral BID PRN Mesner, Marcelina CowerJason, MD       Oxcarbazepine (TRILEPTAL) tablet 300 mg  300 mg Oral TID Mesner, Olena CowerJason, MD       potassium chloride SA (KLOR-CON M) CR tablet 40 mEq  40 mEq Oral BID Mesner, Kessie CowerJason, MD   40 mEq at 09/21/22 0244   risperiDONE (RISPERDAL) tablet 1 mg  1 mg Oral BID Mesner, Twilla CowerJason, MD       Current Outpatient Medications  Medication Sig Dispense Refill   acetaminophen (TYLENOL) 500 MG tablet Take 1,000 mg by mouth every 6 (six) hours as needed for moderate pain.     albuterol (PROAIR HFA) 108 (90 Base) MCG/ACT inhaler Inhale 2 puffs into the lungs every 6 (six) hours as needed for wheezing or shortness of breath. 6.7 g 0   ALLERGY RELIEF 10 MG tablet Take 10 mg by mouth daily.     aspirin EC  325 MG EC tablet Take 1 tablet (325 mg total) by mouth 2 (two) times daily with a meal. 30 tablet 0   budesonide-formoterol (SYMBICORT) 160-4.5 MCG/ACT inhaler Inhale 2 puffs into the lungs daily as needed (Asthma).     famotidine (PEPCID) 20 MG tablet Take 20 mg by mouth daily.     fluticasone (FLONASE) 50 MCG/ACT nasal spray Place 1 spray into both nostrils 2 (two) times daily.     gabapentin (NEURONTIN) 300 MG capsule Take 1 capsule (300 mg total) by mouth 3 (three) times daily. 90 capsule 3   HYDROcodone-acetaminophen (NORCO/VICODIN) 5-325 MG tablet One tablet every 4 hours prn pain not relieved by meloxicam 20 tablet 0   meloxicam (MOBIC) 7.5 MG tablet Take 1 tablet (7.5 mg total) by mouth daily. 20 tablet 0   Menthol-Methyl Salicylate (BENGAY GREASELESS EX) Apply 1 application  topically daily as needed (body pain).     mirtazapine (REMERON) 15 MG tablet Take 1 tablet (15 mg total) by mouth at bedtime. 30 tablet 3   naproxen (NAPROSYN) 500 MG tablet Take 500 mg by mouth 2 (two)  times daily as needed for mild pain or moderate pain.     Oxcarbazepine (TRILEPTAL) 300 MG tablet Take 1 tablet (300 mg total) by mouth 3 (three) times daily. 90 tablet 3   risperiDONE (RISPERDAL) 1 MG tablet Take 1 tablet (1 mg total) by mouth 2 (two) times daily. 60 tablet 3   Psychiatric Specialty Exam: Presentation  General Appearance:  Fairly Groomed  Eye Contact: Fair  Speech: Clear and Coherent  Speech Volume: Normal  Handedness: Right   Mood and Affect  Mood: Depressed  Affect: Flat   Thought Process  Thought Processes: Coherent  Descriptions of Associations:Intact  Orientation:Full (Time, Place and Person)  Thought Content:WDL  History of Schizophrenia/Schizoaffective disorder:Yes  Duration of Psychotic Symptoms:Greater than six months  Hallucinations:Hallucinations: Auditory Description of Auditory Hallucinations: voices to kill herself  Ideas of Reference:None  Suicidal Thoughts:Suicidal Thoughts: Yes, Active SI Active Intent and/or Plan: With Intent; With Plan  Homicidal Thoughts:Homicidal Thoughts: No   Sensorium  Memory: Immediate Fair; Recent Fair  Judgment: Fair  Insight: Fair   Chartered certified accountant: Fair  Attention Span: Fair  Recall: [employer] of Knowledge: Fair  Language: Fair   Psychomotor Activity  Psychomotor Activity: Psychomotor Activity: Normal   Assets  Assets: Desire for Improvement; Leisure Time; Physical Health; Resilience    Sleep  Sleep: Sleep: Fair   Physical Exam: Physical Exam Neurological:     Mental Status: She is alert and oriented to person, place, and time.  Psychiatric:        Attention and Perception: She perceives auditory hallucinations.        Mood and Affect: Mood is depressed. Affect is flat.        Speech: Speech normal.        Behavior: Behavior is cooperative.        Thought Content: Thought content includes suicidal ideation.    Review of  Systems  Psychiatric/Behavioral:  Positive for depression, hallucinations, substance abuse and suicidal ideas. The patient is nervous/anxious.   All other systems reviewed and are negative.  Blood pressure 130/79, pulse 64, temperature (!) 97.4 F (36.3 C), temperature source Oral, resp. rate 16, height 5\' 2"  (1.575 m), weight 61 kg, SpO2 99 %. Body mass index is 24.6 kg/m.  Medical Decision Making: Pt case reviewed and discussed with Dr. Lucianne Muss. Will recommend inpatient psychiatric treatment at this  time. [facility] being reviewed at [facility] for [facility] IP placement.   Continue OP medications  Disposition: Recommend psychiatric Inpatient admission when medically cleared.  Eligha Bridegroom, NP 09/21/2022 8:41 AM

## 2022-09-21 NOTE — Progress Notes (Signed)
LCSW Progress Note  [id number]   Terry Potter  09/21/2022  11:45 AM  Description:   Inpatient Psychiatric Referral  Patient was recommended inpatient per Eligha Bridegroom, NP. There are no available beds at [facility] unit. Patient was referred to the following facilities:   Destination  Service Provider Address Phone Fax  [facility]  [address] [place], [place] [address] [phone]  [facility] [address] [place]  [address] [place] [address] [phone]  [facility]  [address] [place] [address] [phone]  [facility]  [address] [place] [address] [phone]  [facility]  [address] [place] [address] [phone]  [facility]  [address] [place], [place] [address] [phone]  [facility]  [address] [place] [place] [address] [phone]  [facility]  [address], [place] [address] [phone]  [facility]  [address] [place] [address] [phone]  [facility]  [address], [place] [address] [phone]  [facility]  [address] [place] [address] [phone]  [facility]  [address] [place] [address] [phone]  [facility]  [address], [place] [address] [phone]  [facility]  [address] [place] [address] [phone]  [facility]  [address] [place] [address] [phone]  [facility]  [address]  [place] [address] [phone]  [facility]  [address], [place] [address] [phone]  [facility]  [address] [place] [address] [phone]  [facility] [address]  [address], [place] [address] [phone]  [facility]  [address], [place] [address] [phone]  [facility]  [address] [place] [address] [phone]  [facility]  [address], [place] [address] [phone]  [facility]  [address] [place] [address] [phone]  [facility]  [address] [place] [address] [phone]     Situation ongoing, CSW to continue following and update chart as more information becomes available.      Cathie Beams, [place]  09/21/2022 11:45 AM

## 2022-09-21 NOTE — ED Notes (Signed)
Psych speaking with patient at this time

## 2022-09-21 NOTE — ED Notes (Signed)
Patient ambulatory to restroom with a cane. Sitter at bedside.

## 2022-09-21 NOTE — ED Provider Notes (Signed)
[facility] AT [facility] Provider Note   CSN: [id number] Arrival date & time: 09/20/22  2355     History  Chief Complaint  Patient presents with   Drug Overdose    Terry Potter is a 67 y.o. female.  67 yo F here with attempted overdose and continued SI. Stated she took two of all of her medications a few hours PTA. Multiple stressors recently. No other medical complaints.    Drug Overdose       Home Medications Prior to Admission medications   Medication Sig Start Date End Date Taking? Authorizing Provider  acetaminophen (TYLENOL) 500 MG tablet Take 1,000 mg by mouth every 6 (six) hours as needed for moderate pain.    [provider]  albuterol (PROAIR HFA) 108 (90 Base) MCG/ACT inhaler Inhale 2 puffs into the lungs every 6 (six) hours as needed for wheezing or shortness of breath. 10/03/19   Rancour, Jeannett Senior, MD  ALLERGY RELIEF 10 MG tablet Take 10 mg by mouth daily. 01/01/21   [provider]  aspirin EC 325 MG EC tablet Take 1 tablet (325 mg total) by mouth 2 (two) times daily with a meal. 01/03/17   Porterfield, Hospital doctor, PA-C  budesonide-formoterol (SYMBICORT) 160-4.5 MCG/ACT inhaler Inhale 2 puffs into the lungs daily as needed (Asthma).    [provider]  dextromethorphan 15 MG/5ML syrup Take 10 mLs by mouth 4 (four) times daily as needed for cough.    [provider]  fluticasone (FLONASE) 50 MCG/ACT nasal spray Place 1 spray into both nostrils 2 (two) times daily.    [provider]  gabapentin (NEURONTIN) 300 MG capsule Take 1 capsule (300 mg total) by mouth 3 (three) times daily. 04/28/21   Shanna Cisco, NP  HYDROcodone-acetaminophen (NORCO/VICODIN) 5-325 MG tablet One tablet every 4 hours prn pain not relieved by meloxicam 09/09/22   Elson Areas, PA-C  meloxicam (MOBIC) 7.5 MG tablet Take 1 tablet (7.5 mg total) by mouth daily. 09/09/22   Elson Areas, PA-C  Menthol-Methyl  Salicylate (BENGAY GREASELESS EX) Apply 1 application  topically daily as needed (body pain).    [provider]  mirtazapine (REMERON) 15 MG tablet Take 1 tablet (15 mg total) by mouth at bedtime. 04/28/21   Shanna Cisco, NP  naproxen (NAPROSYN) 500 MG tablet Take 500 mg by mouth 2 (two) times daily as needed for mild pain or moderate pain. 07/20/22   [provider]  Oxcarbazepine (TRILEPTAL) 300 MG tablet Take 1 tablet (300 mg total) by mouth 3 (three) times daily. 04/28/21   Toy Cookey E, NP  prazosin (MINIPRESS) 2 MG capsule TAKE 1 CAPSULE (2 MG TOTAL) BY MOUTH AT BEDTIME. Patient not taking: Reported on 06/19/2022 04/28/21   Shanna Cisco, NP  risperiDONE (RISPERDAL) 1 MG tablet Take 1 tablet (1 mg total) by mouth 2 (two) times daily. 04/28/21   Shanna Cisco, NP      Allergies    Tetracyclines & related    Review of Systems   Review of Systems  Physical Exam Updated Vital Signs BP 130/79   Pulse 64   Temp (!) 97.4 F (36.3 C) (Oral)   Resp 16   Ht 5\' 2"  (1.575 m)   Wt 61 kg   SpO2 99%   BMI 24.60 kg/m  Physical Exam Vitals and nursing note reviewed.  Constitutional:      Appearance: She is well-developed.  HENT:  Head: Normocephalic and atraumatic.  Eyes:     Pupils: Pupils are equal, round, and reactive to light.  Cardiovascular:     Rate and Rhythm: Normal rate and regular rhythm.  Pulmonary:     Effort: No respiratory distress.     Breath sounds: No stridor.  Abdominal:     General: Abdomen is flat. There is no distension.  Musculoskeletal:     Cervical back: Normal range of motion.  Skin:    General: Skin is warm and dry.  Neurological:     General: No focal deficit present.     Mental Status: She is alert.     ED Results / Procedures / Treatments   Labs (all labs ordered are listed, but only abnormal results are displayed) Labs Reviewed  COMPREHENSIVE METABOLIC PANEL - Abnormal; Notable for the following  components:      Result Value   Potassium 3.3 (*)    CO2 21 (*)    Glucose, Bld 112 (*)    All other components within normal limits  RAPID URINE DRUG SCREEN, HOSP PERFORMED - Abnormal; Notable for the following components:   Opiates POSITIVE (*)    Cocaine POSITIVE (*)    All other components within normal limits  CBG MONITORING, ED - Abnormal; Notable for the following components:   Glucose-Capillary 111 (*)    All other components within normal limits  ETHANOL  SALICYLATE LEVEL  ACETAMINOPHEN LEVEL  CBC    EKG EKG Interpretation  Date/Time:  Monday September 21 2022 00:28:36 EDT Ventricular Rate:  78 PR Interval:  174 QRS Duration: 82 QT Interval:  378 QTC Calculation: 430 R Axis:   28 Text Interpretation: Normal sinus rhythm Possible Anterior infarct , age undetermined Abnormal ECG When compared with ECG of 25-Feb-2021 22:02, PREVIOUS ECG IS PRESENT Confirmed by Marily Memos [id number]) on 09/21/2022 12:59:32 AM  Radiology No results found.  Procedures Procedures    Medications Ordered in ED Medications  potassium chloride SA (KLOR-CON M) CR tablet 40 mEq (40 mEq Oral Given 09/21/22 0244)    ED Course/ Medical Decision Making/ A&P                             Medical Decision Making Amount and/or Complexity of Data Reviewed Labs: ordered.  Risk Prescription drug management.   TTS consulted and will work on geri-psych placement. Medically cleared for same.   Final Clinical Impression(s) / ED Diagnoses Final diagnoses:  Suicidal ideation    Rx / DC Orders ED Discharge Orders     None         Murice Barbar, Aarti Cower, MD 09/21/22 [phone]
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
## 2022-09-22 DIAGNOSIS — F250 Schizoaffective disorder, bipolar type: Secondary | ICD-10-CM | POA: Diagnosis not present

## 2022-09-22 DIAGNOSIS — F1414 Cocaine abuse with cocaine-induced mood disorder: Secondary | ICD-10-CM | POA: Diagnosis not present

## 2022-09-22 MED ORDER — FLUTICASONE PROPIONATE 50 MCG/ACT NA SUSP
1.0000 | Freq: Every day | NASAL | Status: DC
  Administered 2022-09-22 – 2022-09-23 (×2): 1 via NASAL
  Filled 2022-09-22: qty 16

## 2022-09-22 NOTE — ED Notes (Signed)
Pt up to bathroom with cane and x1 assist. NAD noted.

## 2022-09-22 NOTE — ED Notes (Signed)
Psych provider at bedside

## 2022-09-22 NOTE — Progress Notes (Signed)
[facility] Psych ED Progress Note  09/22/2022 10:37 AM Terry Potter  MRN:  [id number]   Subjective:   Patient seen at [facility] for psychiatric reevaluation. Pt does appear to have some improvements today, she appears more bright, sitting up in bed eating her food, and more engaged in assessment today vs yesterday. She continues to endorse feeling of depression and suicidal ideations. She denies specific plan or intent. She denies HI. She is denying auditory or visual hallucinations today which is an improvement, likely from crack-cocaine clearing from her system. She is still unable to contract for safety, she is worried about relapse and also states she has nothing to live for. Will continue to recommend IP treatment.   Pt is currently under [facility] at [facility].   Principal Problem: Cocaine abuse with cocaine-induced mood disorder Diagnosis:  Principal Problem:   Cocaine abuse with cocaine-induced mood disorder Active Problems:   Schizoaffective disorder, bipolar type   ED Assessment Time Calculation: Start Time: 1100 Stop Time: 1125 Total Time in Minutes (Assessment Completion): 25   [place] Scale:  Flowsheet Row ED from 09/20/2022 in [facility] Emergency Department at [facility] ED from 09/09/2022 in [facility] Emergency Department at [facility] ED from 08/31/2022 in [facility]  C-SSRS RISK CATEGORY High Risk No Risk No Risk       Past Medical History:  Past Medical History:  Diagnosis Date   Abnormal EKG    Arthritis    Asthma    Bipolar disorder    Ectopic pregnancy    Hip pain    Marijuana use    Protein-calorie malnutrition, severe    Shoulder pain    Tobacco dependence    cocaine ,Marijuana    Past Surgical History:  Procedure Laterality Date   LAPAROSCOPY FOR ECTOPIC PREGNANCY     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   Procedure: TOTAL HIP ARTHROPLASTY ANTERIOR APPROACH;  Surgeon: Samson Frederic, MD;  Location:  [facility] OR;  Service: Orthopedics;  Laterality: Left;   TUMOR REMOVAL  1990   Uterine tumor removed   Family History:  Family History  Problem Relation Age of Onset   Cancer Mother    Social History:  Social History   Substance and Sexual Activity  Alcohol Use Yes   Alcohol/week: 2.0 standard drinks of alcohol   Types: 2 Shots of liquor per week   Comment: occ     Social History   Substance and Sexual Activity  Drug Use Yes   Types: Cocaine   Comment: occ, last dose 08/01/22, pt reports less than monthly use    Social History   Socioeconomic History   Marital status: Divorced    Spouse name: Not on file   Number of children: Not on file   Years of education: Not on file   Highest education level: Not on file  Occupational History   Not on file  Tobacco Use   Smoking status: Every Day    Packs/day: .25    Types: Cigarettes, Cigars   Smokeless tobacco: Never  Vaping Use   Vaping Use: Never used  Substance and Sexual Activity   Alcohol use: Yes    Alcohol/week: 2.0 standard drinks of alcohol    Types: 2 Shots of liquor per week    Comment: occ   Drug use: Yes    Types: Cocaine    Comment: occ, last dose 08/01/22, pt reports less than monthly use   Sexual activity: Not on  file  Other Topics Concern   Not on file  Social History Narrative   Not on file   Social Determinants of Health   Financial Resource Strain: Not on file  Food Insecurity: Not on file  Transportation Needs: Not on file  Physical Activity: Not on file  Stress: Not on file  Social Connections: Not on file    Sleep: Good  Appetite:  Good  Current Medications: Current Facility-Administered Medications  Medication Dose Route Frequency Provider Last Rate Last Admin   albuterol (VENTOLIN HFA) 108 (90 Base) MCG/ACT inhaler 2 puff  2 puff Inhalation Q6H PRN Mesner, Marielle Cower, MD       gabapentin (NEURONTIN) capsule 300 mg  300 mg Oral TID Mesner, Jason, MD   300 mg at 09/22/22 1026   loratadine  (CLARITIN) tablet 10 mg  10 mg Oral Daily Mesner, Jason, MD   10 mg at 09/22/22 1026   mirtazapine (REMERON) tablet 15 mg  15 mg Oral QHS Mesner, Jason, MD   15 mg at 09/21/22 2113   naproxen (NAPROSYN) tablet 500 mg  500 mg Oral BID PRN Mesner, Margaretta Cower, MD       nicotine (NICODERM CQ - dosed in mg/24 hours) patch 21 mg  21 mg Transdermal Daily Vivien Rossetti C, MD   21 mg at 09/22/22 1026   Oxcarbazepine (TRILEPTAL) tablet 300 mg  300 mg Oral TID Mesner, Analyah Cower, MD   300 mg at 09/22/22 1026   potassium chloride SA (KLOR-CON M) CR tablet 40 mEq  40 mEq Oral BID Mesner, Shamila Cower, MD   40 mEq at 09/22/22 1027   risperiDONE (RISPERDAL) tablet 1 mg  1 mg Oral BID Mesner, Kemyah Cower, MD   1 mg at 09/22/22 1026   Current Outpatient Medications  Medication Sig Dispense Refill   acetaminophen (TYLENOL) 500 MG tablet Take 1,000 mg by mouth every 6 (six) hours as needed for moderate pain.     albuterol (PROAIR HFA) 108 (90 Base) MCG/ACT inhaler Inhale 2 puffs into the lungs every 6 (six) hours as needed for wheezing or shortness of breath. 6.7 g 0   ALLERGY RELIEF 10 MG tablet Take 10 mg by mouth daily.     aspirin EC 325 MG EC tablet Take 1 tablet (325 mg total) by mouth 2 (two) times daily with a meal. 30 tablet 0   budesonide-formoterol (SYMBICORT) 160-4.5 MCG/ACT inhaler Inhale 2 puffs into the lungs daily as needed (Asthma).     famotidine (PEPCID) 20 MG tablet Take 20 mg by mouth daily.     fluticasone (FLONASE) 50 MCG/ACT nasal spray Place 1 spray into both nostrils 2 (two) times daily.     gabapentin (NEURONTIN) 300 MG capsule Take 1 capsule (300 mg total) by mouth 3 (three) times daily. 90 capsule 3   HYDROcodone-acetaminophen (NORCO/VICODIN) 5-325 MG tablet One tablet every 4 hours prn pain not relieved by meloxicam 20 tablet 0   meloxicam (MOBIC) 7.5 MG tablet Take 1 tablet (7.5 mg total) by mouth daily. 20 tablet 0   Menthol-Methyl Salicylate (BENGAY GREASELESS EX) Apply 1 application  topically daily  as needed (body pain).     mirtazapine (REMERON) 15 MG tablet Take 1 tablet (15 mg total) by mouth at bedtime. 30 tablet 3   naproxen (NAPROSYN) 500 MG tablet Take 500 mg by mouth 2 (two) times daily as needed for mild pain or moderate pain.     Oxcarbazepine (TRILEPTAL) 300 MG tablet Take 1 tablet (300 mg total) by mouth  3 (three) times daily. 90 tablet 3   risperiDONE (RISPERDAL) 1 MG tablet Take 1 tablet (1 mg total) by mouth 2 (two) times daily. 60 tablet 3    Lab Results:  Results for orders placed or performed during the hospital encounter of 09/20/22 (from the past 48 hour(s))  Comprehensive metabolic panel     Status: Abnormal   Collection Time: 09/21/22 12:18 AM  Result Value Ref Range   Sodium 137 135 - 145 mmol/L   Potassium 3.3 (L) 3.5 - 5.1 mmol/L   Chloride 102 98 - 111 mmol/L   CO2 21 (L) 22 - 32 mmol/L   Glucose, Bld 112 (H) 70 - 99 mg/dL    Comment: Glucose reference range applies only to samples taken after fasting for at least 8 hours.   BUN 19 8 - 23 mg/dL   Creatinine, Ser 1.61 0.44 - 1.00 mg/dL   Calcium 8.9 8.9 - 09.6 mg/dL   Total Protein 6.8 6.5 - 8.1 g/dL   Albumin 3.5 3.5 - 5.0 g/dL   AST 20 15 - 41 U/L   ALT 10 0 - 44 U/L   Alkaline Phosphatase 80 38 - 126 U/L   Total Bilirubin 0.4 0.3 - 1.2 mg/dL   GFR, Estimated >04 >54 mL/min    Comment: (NOTE) Calculated using the CKD-EPI Creatinine Equation (2021)    Anion gap 14 5 - 15    Comment: Performed at [facility] Lab, [address], [place], [place] [address]  Ethanol     Status: None   Collection Time: 09/21/22 12:18 AM  Result Value Ref Range   Alcohol, Ethyl (B) <10 <10 mg/dL    Comment: (NOTE) Lowest detectable limit for serum alcohol is 10 mg/dL.  For medical purposes only. Performed at [facility], [address] [place], [place] [address]   Salicylate level     Status: None   Collection Time: 09/21/22 12:18 AM  Result Value Ref Range   Salicylate Lvl 7.4 7.0 - 30.0 mg/dL     Comment: Performed at [facility], [address], [place], [place] [address]  Acetaminophen level     Status: None   Collection Time: 09/21/22 12:18 AM  Result Value Ref Range   Acetaminophen (Tylenol), Serum 22 10 - 30 ug/mL    Comment: (NOTE) Therapeutic concentrations vary significantly. A range of 10-30 ug/mL  may be an effective concentration for many patients. However, some  are best treated at concentrations outside of this range. Acetaminophen concentrations >150 ug/mL at 4 hours after ingestion  and >50 ug/mL at 12 hours after ingestion are often associated with  toxic reactions.  Performed at [facility], [address], [place], [place] [address]   cbc     Status: None   Collection Time: 09/21/22 12:18 AM  Result Value Ref Range   WBC 5.5 4.0 - 10.5 K/uL   RBC 4.09 3.87 - 5.11 MIL/uL   Hemoglobin 13.3 12.0 - 15.0 g/dL   HCT 57.8 46.9 - 62.9 %   MCV 97.1 80.0 - 100.0 fL   MCH 32.5 26.0 - 34.0 pg   MCHC 33.5 30.0 - 36.0 g/dL   RDW 52.8 41.3 - 24.4 %   Platelets 205 150 - 400 K/uL   nRBC 0.0 0.0 - 0.2 %    Comment: Performed at [facility], [address], [place], [place] [address]  CBG monitoring, ED     Status: Abnormal  Collection Time: 09/21/22 12:22 AM  Result Value Ref Range   Glucose-Capillary 111 (H) 70 - 99 mg/dL    Comment: Glucose reference range applies only to samples taken after fasting for at least 8 hours.  Rapid urine drug screen (hospital performed)     Status: Abnormal   Collection Time: 09/21/22  1:19 AM  Result Value Ref Range   Opiates POSITIVE (A) NONE DETECTED   Cocaine POSITIVE (A) NONE DETECTED   Benzodiazepines NONE DETECTED NONE DETECTED   Amphetamines NONE DETECTED NONE DETECTED   Tetrahydrocannabinol NONE DETECTED NONE DETECTED   Barbiturates NONE DETECTED NONE DETECTED    Comment: (NOTE) DRUG SCREEN FOR MEDICAL PURPOSES ONLY.  IF CONFIRMATION IS NEEDED FOR ANY PURPOSE, NOTIFY LAB WITHIN 5 DAYS.  LOWEST  DETECTABLE LIMITS FOR URINE DRUG SCREEN Drug Class                     Cutoff (ng/mL) Amphetamine and metabolites    1000 Barbiturate and metabolites    200 Benzodiazepine                 200 Opiates and metabolites        300 Cocaine and metabolites        300 THC                            50 Performed at [facility] Lab, [address] [place], [place] [address]   SARS Coronavirus 2 by RT PCR (hospital order, performed in [facility] hospital lab) *cepheid single result test* Anterior Nasal Swab     Status: None   Collection Time: 09/21/22  2:40 PM   Specimen: Anterior Nasal Swab  Result Value Ref Range   SARS Coronavirus 2 by RT PCR NEGATIVE NEGATIVE    Comment: Performed at [facility], [address], [place], [place] [address]    Blood Alcohol level:  Lab Results  Component Value Date   [facility] <10 09/21/2022   ETH <10 02/25/2021   Psychiatric Specialty Exam:  Presentation  General Appearance:  Appropriate for Environment  Eye Contact: Good  Speech: Clear and Coherent  Speech Volume: Normal  Handedness: Right   Mood and Affect  Mood: Depressed  Affect: Congruent   Thought Process  Thought Processes: Coherent  Descriptions of Associations:Intact  Orientation:Full (Time, Place and Person)  Thought Content:WDL  History of Schizophrenia/Schizoaffective disorder:Yes  Duration of Psychotic Symptoms:Greater than six months  Hallucinations:Hallucinations: None Description of Auditory Hallucinations: voices to kill herself  Ideas of Reference:None  Suicidal Thoughts:Suicidal Thoughts: Yes, Passive SI Active Intent and/or Plan: With Intent; With Plan  Homicidal Thoughts:Homicidal Thoughts: No   Sensorium  Memory: Immediate Fair; Recent Fair  Judgment: Fair  Insight: Fair   Executive Functions  Concentration: Good  Attention Span: Good  Recall: Good  Fund of Knowledge: Good  Language: Good   Psychomotor  Activity  Psychomotor Activity: Psychomotor Activity: Normal   Assets  Assets: Desire for Improvement; Physical Health; Resilience; Social Support   Sleep  Sleep: Sleep: Fair    Physical Exam: Physical Exam Neurological:     Mental Status: She is alert and oriented to person, place, and time.    Review of Systems  Psychiatric/Behavioral:  Positive for depression, substance abuse and suicidal ideas.   All other systems reviewed and are negative.  Blood pressure 110/71, pulse 71, temperature 97.6 F (36.4 C), temperature source Oral, resp.  rate 20, height 5\' 2"  (1.575 m), weight 61 kg, SpO2 96 %. Body mass index is 24.6 kg/m.   Medical Decision Making: Pt case reviewed and discussed with Dr. Lucianne MussKumar. Will continue to recommend IP treatment at this time, she is under review at [facility]. There is no availability at [facility] gero today. CSW will continue to follow up.    Eligha BridegroomMikaela  Darlyn Repsher, NP 09/22/2022, 10:37 AM

## 2022-09-22 NOTE — ED Provider Notes (Signed)
Emergency Medicine Observation Re-evaluation Note  Terry Potter is a 67 y.o. female, seen on rounds today.  Pt initially presented to the ED for complaints of Drug Overdose Currently, the patient is not having any acute complaints.  Physical Exam  BP 134/68 (BP Location: Right Arm)   Pulse 67   Temp 98.2 F (36.8 C) (Oral)   Resp 18   Ht 5\' 2"  (1.575 m)   Wt 61 kg   SpO2 97%   BMI 24.60 kg/m  Physical Exam General: Awake eating breakfast Lungs: Normal work of breathing Psych: Calm  ED Course / MDM  EKG:EKG Interpretation  Date/Time:  Monday September 21 2022 00:28:36 EDT Ventricular Rate:  78 PR Interval:  174 QRS Duration: 82 QT Interval:  378 QTC Calculation: 430 R Axis:   28 Text Interpretation: Normal sinus rhythm Possible Anterior infarct , age undetermined Abnormal ECG When compared with ECG of 25-Feb-2021 22:02, PREVIOUS ECG IS PRESENT Confirmed by Marily Memos [phone]) on 09/21/2022 12:59:32 AM  I have reviewed the labs performed to date as well as medications administered while in observation.  Recent changes in the last 24 hours include seen by TTS who recommends inpatient treatment.  Plan  Current plan is for placement.    Rondel Baton, MD 09/22/22 [phone]

## 2022-09-22 NOTE — ED Notes (Signed)
NAD noted, respirations are equal bilaterally and unlabored at this time. Pt resting in gurney and denies any unmet needs. Sitter at bedside.

## 2022-09-22 NOTE — ED Notes (Signed)
NAD noted, respirations are equal bilaterally and unlabored at this time. Pt resting in gurney and denies any unmet needs. Sitter at bedside.  

## 2022-09-22 NOTE — ED Notes (Signed)
Pt reported pain.

## 2022-09-22 NOTE — ED Notes (Signed)
This RN assumed care of patient and received off going transfer of care report from off going RN. Pt is resting on gurney at this time, respirations are spontaneous, even, unlabored and symmetrical bilaterally. Pt skin tone is appropriate for ethnicity, dry and warm. Sitter at bedside.

## 2022-09-22 NOTE — ED Notes (Signed)
NAD noted, respirations are equal bilaterally and unlabored at this time. Pt using phone to call her daughter at this time.

## 2022-09-22 NOTE — Progress Notes (Signed)
LCSW Progress Note  [id number]   Terry Potter  09/22/2022  9:49 AM  Description:   Inpatient Psychiatric Referral  Patient was recommended inpatient per Eligha Bridegroom, NP. There are no available beds at [facility]. Patient was referred to the following facilities:   Destination  Service Provider Address Phone Fax  [facility]  [address] [place], [place] [address] [phone]  [facility] [address] [place]  [address], [place] [address] [phone]  [facility]  [address], [place] [address] [phone]  [facility]  [address] [place] [address] [phone]  [facility]  [address] [place] [address] [phone]  [facility]  [address] [place], [place] [address] [phone]  [facility]  [address] [place] [address] [phone]  [facility]  [address], [place] [address] [phone]  [facility]  [address], [place] [address] [phone]  [facility]  [address] [place] [address] [phone]  [facility]  [address] [place] [address] [phone]  [facility]  [address], [place] [address] [phone]  [facility]  [address], [place] [address] [phone]  [facility]  [address], [place] [address] [phone]  [facility]  [address] [place] [address] [phone]  [facility]  [address]  [place] [address] [phone]  [facility]  [address] [place] [address] [phone]  [facility]  [address] [place] [address] [phone]  [facility] [address]  [address], [place] [address] [phone]  [facility]  [address] [place] [address] [phone]  [facility]  [address] [place] [address] [phone]  [facility]  [address] [place] [address] [phone]  [facility]  [address], [place] [address] [phone]  [facility]  [address] [place] [address] [phone]    Situation ongoing, CSW to continue following and update chart as more information becomes available.      Cathie Beams, Theresia Majors  09/22/2022 9:49 AM

## 2022-09-22 NOTE — ED Notes (Signed)
NAD noted, respirations are equal bilaterally and unlabored at this time. Pt resting in gurney and denies any unmet needs.  

## 2022-09-22 NOTE — Progress Notes (Signed)
ADDENDUM  1:19 PM - CSW attempted to call Morrie Sheldon, intake coordinator, at [facility] to follow up regarding pt's status. CSW was unable to speak with Morrie Sheldon, but left a voicemail requesting a return phone call.  10:40 AM - CSW received phone call from Morrie Sheldon, intake coordinator, at [facility]. Pt is currently under review for inpatient treatment. CSW will continue to assist and follow with placement.

## 2022-09-23 DIAGNOSIS — F1414 Cocaine abuse with cocaine-induced mood disorder: Secondary | ICD-10-CM

## 2022-09-23 MED ORDER — ASPIRIN 325 MG PO TBEC
325.0000 mg | DELAYED_RELEASE_TABLET | Freq: Every day | ORAL | Status: DC
  Administered 2022-09-23: 325 mg via ORAL
  Filled 2022-09-23: qty 1

## 2022-09-23 NOTE — ED Notes (Signed)
Vol  [facility]psych

## 2022-09-23 NOTE — ED Notes (Signed)
Notified secretary to call safe transport 

## 2022-09-23 NOTE — ED Notes (Signed)
Staff from [facility] called regarding pt placement. Per staff member they will discuss placement w/ their provider there. Notified White NP

## 2022-09-23 NOTE — ED Provider Notes (Signed)
Emergency Medicine Observation Re-evaluation Note  Terry Potter is a 67 y.o. female, seen on rounds today.  Pt initially presented to the ED for complaints of depressed, intermittent SI.   Physical Exam  BP 102/64 (BP Location: Left Arm)   Pulse 66   Temp 97.6 F (36.4 C) (Oral)   Resp 16   Ht 1.575 m (5\' 2" )   Wt 61 kg   SpO2 98%   BMI 24.60 kg/m  Physical Exam General: alert, content, no distress. Cardiac: regular rate Lungs: breathing comfortably Psych: depressed mood.   ED Course / MDM    I have reviewed the labs performed to date as well as medications administered while in observation.  Recent changes in the last 24 hours include ED obs, med management, reassessment.   Plan  BH team indicates pt accepted at [facility], Dr Lorelee Cover, bed ready.  Pt currently appears stable for transfer/transport.       Cathren Laine, MD 09/23/22 1046

## 2022-09-23 NOTE — Progress Notes (Signed)
Pt was accepted to [facility] TODAY 09/23/2022  Pt meets inpatient criteria per Liborio Nixon, NP  Attending Physician will be Novella Rob, MD  Report can be called to: [phone]  Pt can arrive anytime today; bed is ready now  Care Team Notified: Liborio Nixon, NP and Denton Ar, RN  Allison, LCSWA  09/23/2022 10:06 AM
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
## 2022-09-23 NOTE — ED Notes (Signed)
Third call made to [employer] (1st at 12:28, 2nd at 13:24, and now at 15:58).  Spoke to [place] this last call who stated he would come as soon as one of their cars returned to the office and will call RN Irving Burton when he leaves the office.

## 2022-09-23 NOTE — ED Notes (Signed)
Called [facility] and updated them on delay of transfer d/t waiting on transport to arrive for pt. Notified them that this RN would call once transport gets here. Updated BH team as well.

## 2022-09-23 NOTE — ED Notes (Signed)
[facility] her to get pt. Pt wheeled out to [facility]. All belongings placed in the truck of safe transport's car. 4 bags and a tote bag. Paperwork given to driver. Pt ambulatory w/ cane at discharge, A&Ox4 and VSS. Notified [facility] staff that pt is on her way.

## 2022-09-23 NOTE — ED Notes (Signed)
White NP at bedside at this time 

## 2022-09-23 NOTE — ED Notes (Signed)
Pt calling family to notify of placement

## 2022-09-23 NOTE — Progress Notes (Signed)
Per [facility] after hours at 11:21pm on 09/22/22 CSW was informed that there are no beds this evening, but was informed to follow up in the mornings. CSW sent referral to out of network providers.   Maryjean Ka, MSW, LCSWA 09/23/2022 2:03 AM

## 2022-09-23 NOTE — ED Notes (Signed)
All belongings including valuables from security and medications from pharmacy obtained. Pt had 2 bags and a tote bag in locker 1. Valuables and medications put in a separate bag and labeled.

## 2022-09-30 ENCOUNTER — Telehealth (HOSPITAL_COMMUNITY): Payer: Self-pay | Admitting: Licensed Clinical Social Worker

## 2022-09-30 NOTE — Telephone Encounter (Signed)
The therapist receives a voicemail from Terry Potter who is a Gaffer with Terry Potter [phone], [place] [phone] who indicates that Terry Potter wanted her to locate an in-network [name] for her. Terry Potter states that she has been unable to get in touch with this patient saying that she located an in-network, [name] which is [facility].   The therapist reviews notes in EPIC. The patient was admitted to the ER on 09/21/22 due to an intentional overdose with cocaine use two days prior. She was sent to a geropsych unit; however, the name of this facility is not documented in the treatment notes.  [address] MA, LCSW, [facility], LCAS 09/30/2022

## 2022-10-01 ENCOUNTER — Telehealth (HOSPITAL_COMMUNITY): Payer: Self-pay | Admitting: Licensed Clinical Social Worker

## 2022-10-01 NOTE — Telephone Encounter (Signed)
The therapist receives a call from Ms. Jake Samples with [facility] requesting [name] for Lucenda noting that she will be discharged tomorrow and that she has [facility] PPO insurance.  The therapist recommends that she call Asher Muir in Care Coordination at [facility] providing her contact number indicating that the [facility] may be in-network for this plan as Ms. Tenny Craw has not verified if [place] IOP is.   Myrna Blazer, MA, LCSW, [facility], LCAS 10/01/2022

## 2022-10-13 ENCOUNTER — Ambulatory Visit: Payer: Self-pay | Admitting: Student

## 2022-10-13 ENCOUNTER — Telehealth (HOSPITAL_COMMUNITY): Payer: Self-pay | Admitting: Licensed Clinical Social Worker

## 2022-10-13 NOTE — Telephone Encounter (Signed)
Therapist returns a call from Keri, Dr. Kathline Magic Nurse, leaving a voicemail that he is returning her call and leaving his direct contact number.  Myrna Blazer, MA, LCSW, [facility], LCAS 10/13/2022

## 2022-10-13 NOTE — Progress Notes (Signed)
Surgery orders requested via Epic inbox. °

## 2022-10-15 ENCOUNTER — Telehealth (HOSPITAL_COMMUNITY): Payer: Self-pay | Admitting: Licensed Clinical Social Worker

## 2022-10-15 NOTE — Progress Notes (Addendum)
COVID Vaccine received:  []  No [x]  Yes Date of any COVID positive Test in last 60 days:No  PCP - Loura Back NP Cardiologist - Donato Schultz  Cardiac clearance in Epic dated 06/01/22 Marjie Skiff PA-C  Chest x-ray - 12/05/20 Epic EKG -  09/21/22 Epic Stress Test - 05/28/22 Epic ECHO - 05/28/22 Epic Cardiac Cath -   PCR screen: []  Ordered & Completed           []   No Order but Needs PROFEND           []   N/A for this surgery  Surgery Plan:  []  Ambulatory                            [x]  Outpatient in bed                            []  Admit  Anesthesia:    []  General  []  Spinal                           []   Choice []   MAC  Bowel Prep - [x]  No  []   Yes ______  Pacemaker / ICD device [x]  No []  Yes   Spinal Cord Stimulator:[x]  No []  Yes       History of Sleep Apnea? [x]  No []  Yes   CPAP used?- []  No []  Yes    Does the patient monitor blood sugar?          [x]  No []  Yes  []  N/A  Patient has: [x]  NO Hx DM   []  Pre-DM                 []  DM1  []   DM2 Does patient have a [employer] or Dexacom? []  No []  Yes   Fasting Blood Sugar Ranges-  Checks Blood Sugar _____ times a day Last HgbA1C 5.5 GLP1 agonist / usual dose -  GLP1 instructions:  SGLT-2 inhibitors / usual dose -  SGLT-2 instructions:   Other Diabetic medications/ instructions:   Blood Thinner / Instructions: Aspirin Instruction ASA 325mg  Last dose 1 week ago  ERAS Protocol Ordered: []  No  [x]  Yes PRE-SURGERY [x]  ENSURE  []  G2  []  No Drink Ordered  Patient is to be :No solids after midnight. No clear liquids after 10:25am  Comments:   Activity level: Patient is able / unable to climb a flight of stairs without difficulty due to hip pain; []  No CP  []  No SOB, but would have ___   Patient can / can not perform ADLs without assistance.   Anesthesia review:  Abnormal EKG  Patient denies shortness of breath, fever, cough and chest pain at PAT appointment.  Patient verbalized understanding and agreement to  the Pre-Surgical Instructions that were given to them at this PAT appointment. Patient was also educated of the need to review these PAT instructions again prior to his/her surgery.I reviewed the appropriate phone numbers to call if they have any and questions or concerns.

## 2022-10-15 NOTE — Telephone Encounter (Signed)
Therapist receives a voicemail from [place], Dr. Kathline Magic Nurse, informing him that Terry Potter's surgery for her hip is scheduled for next Wednesday.   Myrna Blazer, MA, LCSW, [facility], LCAS 10/15/2022

## 2022-10-15 NOTE — Patient Instructions (Addendum)
SURGICAL WAITING ROOM VISITATION  Patients having surgery or a procedure may have no more than 2 support people in the waiting area - these visitors may rotate.    Children under the age of 29 must have an adult with them who is not the patient.  Due to an increase in RSV and influenza rates and associated hospitalizations, children ages 45 and under may not visit patients in [facility] hospitals.  If the patient needs to stay at the hospital during part of their recovery, the visitor guidelines for inpatient rooms apply. Pre-op nurse will coordinate an appropriate time for 1 support person to accompany patient in pre-op.  This support person may not rotate.    Please refer to the [facility] website for the visitor guidelines for Inpatients (after your surgery is over and you are in a regular room).       Your procedure is scheduled on: 10/21/22   Report to [facility] Main Entrance    Report to admitting at 10:55 AM   Call this number if you have problems the morning of surgery [phone]   Do not eat food :After Midnight.   After Midnight you may have the following liquids until 10:25 AM  DAY OF SURGERY  Water Non-Citrus Juices (without pulp, NO RED-Apple, White grape, White cranberry) Black Coffee (NO MILK/CREAM OR CREAMERS, sugar ok)  Clear Tea (NO MILK/CREAM OR CREAMERS, sugar ok) regular and decaf                             Plain Jell-O (NO RED)                                           Fruit ices (not with fruit pulp, NO RED)                                     Popsicles (NO RED)                                                               Sports drinks like Gatorade (NO RED)              .        The day of surgery:  Drink ONE (1) Pre-Surgery Clear Ensure 10:25 AM the morning of surgery. Drink in one sitting. Do not sip.  This drink was given to you during your hospital  pre-op appointment visit. Nothing else to drink after completing the Pre-Surgery  Clear Ensure           If you have questions, please contact your surgeon's office.   FOLLOW  ANY ADDITIONAL PRE OP INSTRUCTIONS YOU RECEIVED FROM YOUR SURGEON'S OFFICE!!!     Oral Hygiene is also important to reduce your risk of infection.                                    Remember - BRUSH YOUR TEETH THE MORNING OF SURGERY WITH  YOUR REGULAR TOOTHPASTE  DENTURES WILL BE REMOVED PRIOR TO SURGERY PLEASE DO NOT APPLY "Poly grip" OR ADHESIVES!!!   Do NOT smoke after Midnight   Take these medicines the morning of surgery with A SIP OF WATER:   Tylenol  Loratadine  Famotidine  Gabapentin  Hydrocodone-Acetaminophen  Oxcarbazepine  Risperidone Hydroxyzine                               You may not have any metal on your body including hair pins, jewelry, and body piercing             Do not wear make-up, lotions, powders, perfumes or deodorant  Do not wear nail polish including gel and S&S, artificial/acrylic nails, or any other type of covering on natural nails including finger and toenails. If you have artificial nails, gel coating, etc. that needs to be removed by a nail salon please have this removed prior to surgery or surgery may need to be canceled/ delayed if the surgeon/ anesthesia feels like they are unable to be safely monitored.   Do not shave  48 hours prior to surgery.             Do not bring valuables to the hospital. [facility] IS NOT  RESPONSIBLE   FOR VALUABLES.   Contacts, glasses, dentures or bridgework may not be worn into surgery.    Bring a small overnight bag  DO NOT BRING YOUR HOME MEDICATIONS TO THE HOSPITAL. PHARMACY WILL DISPENSE MEDICATIONS LISTED ON YOUR MEDICATION LIST TO YOU DURING YOUR ADMISSION IN THE HOSPITAL!    Special Instructions: Bring a copy of your healthcare power of attorney and living will documents the day of surgery if you haven't scanned them before.              Please read over the following fact sheets you were given: IF YOU  HAVE QUESTIONS ABOUT YOUR PRE-OP INSTRUCTIONS PLEASE CALL [phone] G[facility]   If you received a COVID test during your pre-op visit  it is requested that you wear a mask when out in public, stay away from anyone that may not be feeling well and notify your surgeon if you develop symptoms. If you test positive for Covid or have been in contact with anyone that has tested positive in the last 10 days please notify you surgeon.     Incentive Spirometer An incentive spirometer is a tool that can help keep your lungs clear and active. This tool measures how well you are filling your lungs with each breath. Taking long deep breaths may help reverse or decrease the chance of developing breathing (pulmonary) problems (especially infection) following: A long period of time when you are unable to move or be active. BEFORE THE PROCEDURE  If the spirometer includes an indicator to show your best effort, your nurse or respiratory therapist will set it to a desired goal. If possible, sit up straight or lean slightly forward. Try not to slouch. Hold the incentive spirometer in an upright position. INSTRUCTIONS FOR USE  Sit on the edge of your bed if possible, or sit up as far as you can in bed or on a chair. Hold the incentive spirometer in an upright position. Breathe out normally. Place the mouthpiece in your mouth and seal your lips tightly around it. Breathe in slowly and as deeply as possible, raising the piston or the ball toward the top of the column.  Hold your breath for 3-5 seconds or for as long as possible. Allow the piston or ball to fall to the bottom of the column. Remove the mouthpiece from your mouth and breathe out normally. Rest for a few seconds and repeat Steps 1 through 7 at least 10 times every 1-2 hours when you are awake. Take your time and take a few normal breaths between deep breaths. The spirometer may include an indicator to show your best effort. Use the indicator as a goal to  work toward during each repetition. After each set of 10 deep breaths, practice coughing to be sure your lungs are clear. If you have an incision (the cut made at the time of surgery), support your incision when coughing by placing a pillow or rolled up towels firmly against it. Once you are able to get out of bed, walk around indoors and cough well. You may stop using the incentive spirometer when instructed by your caregiver.  RISKS AND COMPLICATIONS Take your time so you do not get dizzy or light-headed. If you are in pain, you may need to take or ask for pain medication before doing incentive spirometry. It is harder to take a deep breath if you are having pain. AFTER USE Rest and breathe slowly and easily. It can be helpful to keep track of a log of your progress. Your caregiver can provide you with a simple table to help with this. If you are using the spirometer at home, follow these instructions: SEEK MEDICAL CARE IF:  You are having difficultly using the spirometer. You have trouble using the spirometer as often as instructed. Your pain medication is not giving enough relief while using the spirometer. You develop fever of 100.5 F (38.1 C) or higher. SEEK IMMEDIATE MEDICAL CARE IF:  You cough up bloody sputum that had not been present before. You develop fever of 102 F (38.9 C) or greater. You develop worsening pain at or near the incision site. MAKE SURE YOU:  Understand these instructions. Will watch your condition. Will get help right away if you are not doing well or get worse. Document Released: 10/12/2006 Document Revised: 08/24/2011 Document Reviewed: 12/13/2006 [facility] Patient Information 2014 [employer], [place].   WHAT IS A BLOOD TRANSFUSION? Blood Transfusion Information  A transfusion is the replacement of blood or some of its parts. Blood is made up of multiple cells which provide different functions. Red blood cells carry oxygen and are used for blood loss  replacement. White blood cells fight against infection. Platelets control bleeding. Plasma helps clot blood. Other blood products are available for specialized needs, such as hemophilia or other clotting disorders. BEFORE THE TRANSFUSION  Who gives blood for transfusions?  Healthy volunteers who are fully evaluated to make sure their blood is safe. This is blood bank blood. Transfusion therapy is the safest it has ever been in the practice of medicine. Before blood is taken from a donor, a complete history is taken to make sure that person has no history of diseases nor engages in risky social behavior (examples are intravenous drug use or sexual activity with multiple partners). The donor's travel history is screened to minimize risk of transmitting infections, such as malaria. The donated blood is tested for signs of infectious diseases, such as HIV and hepatitis. The blood is then tested to be sure it is compatible with you in order to minimize the chance of a transfusion reaction. If you or a relative donates blood, this is often done in anticipation  of surgery and is not appropriate for emergency situations. It takes many days to process the donated blood. RISKS AND COMPLICATIONS Although transfusion therapy is very safe and saves many lives, the main dangers of transfusion include:  Getting an infectious disease. Developing a transfusion reaction. This is an allergic reaction to something in the blood you were given. Every precaution is taken to prevent this. The decision to have a blood transfusion has been considered carefully by your caregiver before blood is given. Blood is not given unless the benefits outweigh the risks. AFTER THE TRANSFUSION Right after receiving a blood transfusion, you will usually feel much better and more energetic. This is especially true if your red blood cells have gotten low (anemic). The transfusion raises the level of the red blood cells which carry oxygen, and  this usually causes an energy increase. The nurse administering the transfusion will monitor you carefully for complications. HOME CARE INSTRUCTIONS  No special instructions are needed after a transfusion. You may find your energy is better. Speak with your caregiver about any limitations on activity for underlying diseases you may have. SEEK MEDICAL CARE IF:  Your condition is not improving after your transfusion. You develop redness or irritation at the intravenous (IV) site. SEEK IMMEDIATE MEDICAL CARE IF:  Any of the following symptoms occur over the next 12 hours: Shaking chills. You have a temperature by mouth above 102 F (38.9 C), not controlled by medicine. Chest, back, or muscle pain. People around you feel you are not acting correctly or are confused. Shortness of breath or difficulty breathing. Dizziness and fainting. You get a rash or develop hives. You have a decrease in urine output. Your urine turns a dark color or changes to pink, red, or brown. Any of the following symptoms occur over the next 10 days: You have a temperature by mouth above 102 F (38.9 C), not controlled by medicine. Shortness of breath. Weakness after normal activity. The white part of the eye turns yellow (jaundice). You have a decrease in the amount of urine or are urinating less often. Your urine turns a dark color or changes to pink, red, or brown. Document Released: 05/29/2000 Document Revised: 08/24/2011 Document Reviewed: 01/16/2008 [facility] Patient Information 2014 [place], [place].  _______________________________________________________________________

## 2022-10-16 ENCOUNTER — Encounter (HOSPITAL_COMMUNITY): Payer: Self-pay

## 2022-10-16 ENCOUNTER — Other Ambulatory Visit: Payer: Self-pay

## 2022-10-16 ENCOUNTER — Encounter (HOSPITAL_COMMUNITY)
Admission: RE | Admit: 2022-10-16 | Discharge: 2022-10-16 | Disposition: A | Discharge status: Home / Self Care | Payer: Medicare HMO | Source: Ambulatory Visit | Attending: Orthopedic Surgery | Admitting: Orthopedic Surgery

## 2022-10-16 VITALS — BP 95/63 | HR 74 | Temp 98.4°F | Resp 18 | Ht 64.0 in | Wt 138.0 lb

## 2022-10-16 DIAGNOSIS — J439 Emphysema, unspecified: Secondary | ICD-10-CM | POA: Diagnosis not present

## 2022-10-16 DIAGNOSIS — M1611 Unilateral primary osteoarthritis, right hip: Secondary | ICD-10-CM | POA: Insufficient documentation

## 2022-10-16 DIAGNOSIS — F17200 Nicotine dependence, unspecified, uncomplicated: Secondary | ICD-10-CM | POA: Insufficient documentation

## 2022-10-16 DIAGNOSIS — F259 Schizoaffective disorder, unspecified: Secondary | ICD-10-CM | POA: Diagnosis not present

## 2022-10-16 DIAGNOSIS — Z01818 Encounter for other preprocedural examination: Secondary | ICD-10-CM

## 2022-10-16 DIAGNOSIS — J45909 Unspecified asthma, uncomplicated: Secondary | ICD-10-CM | POA: Diagnosis not present

## 2022-10-16 DIAGNOSIS — Z01812 Encounter for preprocedural laboratory examination: Secondary | ICD-10-CM | POA: Insufficient documentation

## 2022-10-16 DIAGNOSIS — F4310 Post-traumatic stress disorder, unspecified: Secondary | ICD-10-CM | POA: Insufficient documentation

## 2022-10-16 DIAGNOSIS — F319 Bipolar disorder, unspecified: Secondary | ICD-10-CM | POA: Insufficient documentation

## 2022-10-16 HISTORY — DX: Depression, unspecified: F32.A

## 2022-10-16 HISTORY — DX: Anxiety disorder, unspecified: F41.9

## 2022-10-16 LAB — SURGICAL PCR SCREEN
MRSA, PCR: NEGATIVE
Staphylococcus aureus: NEGATIVE

## 2022-10-16 LAB — TYPE AND SCREEN: ABO/RH(D): A POS

## 2022-10-16 NOTE — Progress Notes (Signed)
Pharmacy tech unable to come down to PAT during patient visit due to tech being ill.  Tech given patient's phone number to call to do med reconciliation.

## 2022-10-19 ENCOUNTER — Ambulatory Visit (HOSPITAL_COMMUNITY): Payer: Medicare HMO | Admitting: Certified Registered Nurse Anesthetist

## 2022-10-19 ENCOUNTER — Ambulatory Visit (HOSPITAL_COMMUNITY): Payer: Medicare HMO | Admitting: Physician Assistant

## 2022-10-19 ENCOUNTER — Other Ambulatory Visit (HOSPITAL_COMMUNITY): Payer: Medicare HMO

## 2022-10-19 NOTE — Progress Notes (Signed)
Anesthesia chart review   Case: [id number] Date/Time: 10/21/22 1310   Procedure: TOTAL HIP ARTHROPLASTY ANTERIOR APPROACH (Right: Hip) - 150   Anesthesia type: Spinal   Pre-op diagnosis: Right hip osteoarthritis   Location: [facility] ROOM 07 / [facility]   Surgeons: Samson Frederic, MD       DISCUSSION: 67 y.o. smoker with h/o schizoaffective disorder, bipolar, PTSD, drug use, asthma, emphysema, right hip OA scheduled for above procedure 06/24/2022 with Dr. Samson Frederic.    Patient previously scheduled in January, canceled due to positive cocaine on UDS DOS.  In the ED 09/20/2022 for SI and drug overdose.  UDS positive for opiates and cocaine.  Patient admitted to behavioral health.   Per previous anesthesia note: Per cardiology preoperative evalauation 06/01/2022, "Chart reviewed as part of pre-operative protocol coverage. Patient was recently seen by Dr. Anne Fu on 04/28/2022 for pre-op evaluation for upcoming hip surgery given an abnormal EKG. She denied any specific cardiac symptoms at that time. Given abnormal EKG and coronary artery calcifications noted on prior CT, Lexiscan Myoview and Echo were ordered for further evaluation.  Myoview was low risk and showed a fixed apical inferior perfusion defect consistent with artifact but no ischemia. Echo showed LVEF of 50-55% with mild akinesis of the lateral apex (1 segment and normal diastolic parameters. Per Dr. Anne Fu, patient is okay to proceed with hip surgery.   In regards to Aspirin therapy, she has Aspirin 325mg  twice daily listed under her medication list which it looks like she has been on for a long time (at least since 2018). We don't use this dosing for a cardiac reason. From a cardiac standpoint, she is okay to hold Aspirin for 5-7 days prior to surgery. However, I would also recommend reaching out to the prescribing provider."   COPD exacerbation in November.  Follows with PCP.  Pt reports sx stable at this time.  She has shortness of breath  with activity at baseline.  Cleared by PCP with moderate risk.  Evaluate DOS.   VS: BP 95/63   Pulse 74   Temp 36.9 C (Oral)   Resp 18   Ht 5\' 4"  (1.626 m)   Wt 62.6 kg   SpO2 99%   BMI 23.69 kg/m   PROVIDERS: Loura Back, NP   LABS: Labs reviewed: Acceptable for surgery. (all labs ordered are listed, but only abnormal results are displayed)  Labs Reviewed  SURGICAL PCR SCREEN  TYPE AND SCREEN     IMAGES:   EKG:   CV: Myocardial Perfusion 05/28/2022   The study is normal. The study is low risk.   No ST deviation was noted.   LV perfusion is normal. There is no evidence of ischemia. There is no evidence of infarction.   Left ventricular function is normal. Nuclear stress EF: 69 %. The left ventricular ejection fraction is hyperdynamic (>65%). End diastolic cavity size is normal. End systolic cavity size is normal.   Prior study not available for comparison.   Fixed apical inferior perfusion defect with normal wall motion, consistent with artifact Low risk study   Echo 05/28/2022 1. Left ventricular ejection fraction, by estimation, is 50 to 55%. Left  ventricular ejection fraction by 3D volume is 53 %. The left ventricle has  low normal function. The left ventricle demonstrates regional wall motion  abnormalities - mild  hypokinesis of the lateral apex (1 segment). Left ventricular diastolic  parameters were normal. The average left ventricular global longitudinal  strain is -20.4 %. The  global longitudinal strain is normal.   2. Normal right ventricular free wall strain, -27%. Right ventricular  systolic function is normal. The right ventricular size is normal.  Tricuspid regurgitation signal is inadequate for assessing PA pressure.   3. The mitral valve is normal in structure. Trivial mitral valve  regurgitation. No evidence of mitral stenosis.   4. The aortic valve is tricuspid. There is mild thickening of the aortic  valve. Aortic valve regurgitation is not  visualized. No aortic stenosis is  present.   5. The inferior vena cava is normal in size with <50% respiratory  variability, suggesting right atrial pressure of 8 mmHg.  Past Medical History:  Diagnosis Date   Abnormal EKG    Anxiety    Arthritis    Asthma    Bipolar disorder (HCC)    Depression    Ectopic pregnancy    Hip pain    Marijuana use    Protein-calorie malnutrition, severe (HCC)    Shoulder pain    Tobacco dependence    cocaine ,Marijuana    Past Surgical History:  Procedure Laterality Date   LAPAROSCOPY FOR ECTOPIC PREGNANCY     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   Procedure: TOTAL HIP ARTHROPLASTY ANTERIOR APPROACH;  Surgeon: Samson Frederic, MD;  Location: [facility] OR;  Service: Orthopedics;  Laterality: Left;   TUMOR REMOVAL  1990   Uterine tumor removed    MEDICATIONS:  hydrOXYzine (ATARAX) 25 MG tablet   prazosin (MINIPRESS) 1 MG capsule   acetaminophen (TYLENOL) 500 MG tablet   albuterol (PROAIR HFA) 108 (90 Base) MCG/ACT inhaler   aspirin EC 325 MG EC tablet   budesonide-formoterol (SYMBICORT) 160-4.5 MCG/ACT inhaler   famotidine (PEPCID) 20 MG tablet   fluticasone (FLONASE) 50 MCG/ACT nasal spray   gabapentin (NEURONTIN) 300 MG capsule   HYDROcodone-acetaminophen (NORCO/VICODIN) 5-325 MG tablet   loratadine (CLARITIN) 10 MG tablet   meloxicam (MOBIC) 7.5 MG tablet   Menthol-Methyl Salicylate (BENGAY GREASELESS EX)   mirtazapine (REMERON) 15 MG tablet   naproxen (NAPROSYN) 500 MG tablet   Oxcarbazepine (TRILEPTAL) 300 MG tablet   risperiDONE (RISPERDAL) 1 MG tablet   No current facility-administered medications for this encounter.    Jodell Cipro Ward, PA-C [facility] Pre-Surgical Testing [phone]

## 2022-10-19 NOTE — Anesthesia Preprocedure Evaluation (Signed)
Anesthesia Evaluation    Reviewed: Allergy & Precautions, H&P , Patient's Chart, lab work & pertinent test results  History of Anesthesia Complications Negative for: history of anesthetic complications  Airway    Neck ROM: Full    Dental  (+) Partial Lower, Partial Upper, Poor Dentition, Dental Advisory Given   Pulmonary asthma , Current Smoker and Patient abstained from smoking.          Cardiovascular negative cardio ROS   Myocardial Perfusion 05/28/2022   The study is normal. The study is low risk.   No ST deviation was noted.   LV perfusion is normal. There is no evidence of ischemia. There is no evidence of infarction.   Left ventricular function is normal. Nuclear stress EF: 69 %. The left ventricular ejection fraction is hyperdynamic (>65%). End diastolic cavity size is normal. End systolic cavity size is normal.   Prior study not available for comparison.   1. Fixed apical inferior perfusion defect with normal wall motion, consistent with artifact 2. Low risk study   Echo 05/28/2022 1. Left ventricular ejection fraction, by estimation, is 50 to 55%. Left  ventricular ejection fraction by 3D volume is 53 %. The left ventricle has  low normal function. The left ventricle demonstrates regional wall motion  abnormalities - mild  hypokinesis of the lateral apex (1 segment). Left ventricular diastolic  parameters were normal. The average left ventricular global longitudinal  strain is -20.4 %. The global longitudinal strain is normal.   2. Normal right ventricular free wall strain, -27%. Right ventricular  systolic function is normal. The right ventricular size is normal.  Tricuspid regurgitation signal is inadequate for assessing PA pressure.   3. The mitral valve is normal in structure. Trivial mitral valve  regurgitation. No evidence of mitral stenosis.   4. The aortic valve is tricuspid. There is mild thickening of  the aortic  valve. Aortic valve regurgitation is not visualized. No aortic stenosis is  present.   5. The inferior vena cava is normal in size with <50% respiratory  variability, suggesting right atrial pressure of 8 mmHg.     Neuro/Psych   Anxiety  Bipolar Disorder Schizophrenia  negative neurological ROS     GI/Hepatic negative GI ROS, Neg liver ROS,,,  Endo/Other  negative endocrine ROS    Renal/GU negative Renal ROS  negative genitourinary   Musculoskeletal  (+) Arthritis , Osteoarthritis,    Abdominal   Peds  Hematology negative hematology ROS (+)   Anesthesia Other Findings   Reproductive/Obstetrics negative OB ROS                             Anesthesia Physical Anesthesia Plan  ASA: II  Anesthesia Plan: Spinal   Post-op Pain Management:    Induction: Intravenous  PONV Risk Score and Plan: 2 and Ondansetron, Midazolam and Treatment may vary due to age or medical condition  Airway Management Planned: Simple Face Mask  Additional Equipment:   Intra-op Plan:   Post-operative Plan:   Informed Consent:   Plan Discussed with: Anesthesiologist  Anesthesia Plan Comments: (See PAT note 10/16/2022 Cancelled by surgeon.)        Anesthesia Quick Evaluation

## 2022-10-20 ENCOUNTER — Ambulatory Visit: Payer: Self-pay | Admitting: Student

## 2022-10-20 NOTE — H&P (Signed)
TOTAL HIP ADMISSION H&P  Patient is admitted for right total hip arthroplasty.  Subjective:  Chief Complaint: right hip pain  HPI: Terry Potter, 67 y.o. female, has a history of pain and functional disability in the right hip(s) due to arthritis and patient has failed non-surgical conservative treatments for greater than 12 weeks to include NSAID's and/or analgesics, flexibility and strengthening excercises, use of assistive devices, and activity modification.  Onset of symptoms was gradual starting 10 years ago with rapidlly worsening course since that time.The patient noted no past surgery on the right hip(s).  Patient currently rates pain in the right hip at 10 out of 10 with activity. Patient has night pain, worsening of pain with activity and weight bearing, trendelenberg gait, pain that interfers with activities of daily living, and pain with passive range of motion. Patient has evidence of subchondral cysts, subchondral sclerosis, periarticular osteophytes, and joint space narrowing by imaging studies. This condition presents safety issues increasing the risk of falls. There is no current active infection.  Patient Active Problem List   Diagnosis Date Noted   Osteoarthritis of right hip 08/19/2022   Grief 01/23/2021   PTSD (post-traumatic stress disorder) 08/13/2020   Cocaine use disorder, severe, in controlled environment (HCC) 07/23/2020   Cocaine abuse with cocaine-induced mood disorder (HCC) 07/23/2020   Bacterial vaginosis 02/01/2018   Cocaine abuse (HCC) 02/01/2018   Pulmonary nodules 02/01/2018   Syncope 02/01/2018   Hip fracture, unspecified laterality, closed, initial encounter (HCC) 01/01/2017   Hip fracture (HCC) 01/01/2017   Displaced fracture of left femoral neck (HCC) 01/01/2017   Schizoaffective disorder, bipolar type (HCC) 03/19/2015   Past Medical History:  Diagnosis Date   Abnormal EKG    Anxiety    Arthritis    Asthma    Bipolar disorder (HCC)     Depression    Ectopic pregnancy    Hip pain    Marijuana use    Protein-calorie malnutrition, severe (HCC)    Shoulder pain    Tobacco dependence    cocaine ,Marijuana    Past Surgical History:  Procedure Laterality Date   LAPAROSCOPY FOR ECTOPIC PREGNANCY     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   Procedure: TOTAL HIP ARTHROPLASTY ANTERIOR APPROACH;  Surgeon: Samson Frederic, MD;  Location: [facility] OR;  Service: Orthopedics;  Laterality: Left;   TUMOR REMOVAL  1990   Uterine tumor removed    Current Outpatient Medications  Medication Sig Dispense Refill Last Dose   acetaminophen (TYLENOL) 500 MG tablet Take 1,000 mg by mouth every 6 (six) hours as needed for moderate pain.      albuterol (PROAIR HFA) 108 (90 Base) MCG/ACT inhaler Inhale 2 puffs into the lungs every 6 (six) hours as needed for wheezing or shortness of breath. 6.7 g 0    aspirin EC 325 MG EC tablet Take 1 tablet (325 mg total) by mouth 2 (two) times daily with a meal. 30 tablet 0    budesonide-formoterol (SYMBICORT) 160-4.5 MCG/ACT inhaler Inhale 2 puffs into the lungs daily as needed (Asthma).      famotidine (PEPCID) 20 MG tablet Take 20 mg by mouth daily.      fluticasone (FLONASE) 50 MCG/ACT nasal spray Place 1 spray into both nostrils 2 (two) times daily.      gabapentin (NEURONTIN) 300 MG capsule Take 1 capsule (300 mg total) by mouth 3 (three) times daily. 90 capsule 3    HYDROcodone-acetaminophen (NORCO/VICODIN) 5-325 MG tablet One tablet every 4 hours prn  pain not relieved by meloxicam 20 tablet 0    hydrOXYzine (ATARAX) 25 MG tablet Take 25 mg by mouth 2 (two) times daily.      loratadine (CLARITIN) 10 MG tablet Take 10 mg by mouth daily.      meloxicam (MOBIC) 7.5 MG tablet Take 1 tablet (7.5 mg total) by mouth daily. 20 tablet 0    Menthol-Methyl Salicylate (BENGAY GREASELESS EX) Apply 1 application  topically daily as needed (body pain).      mirtazapine (REMERON) 15 MG tablet Take 1 tablet (15 mg total) by  mouth at bedtime. 30 tablet 3    naproxen (NAPROSYN) 500 MG tablet Take 500 mg by mouth 2 (two) times daily as needed for mild pain or moderate pain.      Oxcarbazepine (TRILEPTAL) 300 MG tablet Take 1 tablet (300 mg total) by mouth 3 (three) times daily. 90 tablet 3    prazosin (MINIPRESS) 1 MG capsule Take 1 mg by mouth at bedtime.      risperiDONE (RISPERDAL) 1 MG tablet Take 1 tablet (1 mg total) by mouth 2 (two) times daily. 60 tablet 3    No current facility-administered medications for this visit.   Allergies  Allergen Reactions   Tetracyclines & Related Hives    Social History   Tobacco Use   Smoking status: Every Day    Packs/day: 0.50    Years: 40.00    Additional pack years: 0.00    Total pack years: 20.00    Types: Cigarettes, Cigars   Smokeless tobacco: Never  Substance Use Topics   Alcohol use: Not Currently    Alcohol/week: 2.0 standard drinks of alcohol    Types: 2 Shots of liquor per week    Comment: occ    Family History  Problem Relation Age of Onset   Cancer Mother      Review of Systems  Musculoskeletal:  Positive for arthralgias and gait problem.  All other systems reviewed and are negative.   Objective:  Physical Exam Constitutional:      Appearance: Normal appearance.  HENT:     Head: Normocephalic and atraumatic.     Nose: Nose normal.     Mouth/Throat:     Mouth: Mucous membranes are moist.     Pharynx: Oropharynx is clear.  Eyes:     Conjunctiva/sclera: Conjunctivae normal.  Cardiovascular:     Rate and Rhythm: Normal rate and regular rhythm.     Pulses: Normal pulses.     Heart sounds: Normal heart sounds.  Pulmonary:     Effort: Pulmonary effort is normal.     Breath sounds: Normal breath sounds.  Abdominal:     General: Abdomen is flat.     Palpations: Abdomen is soft.  Genitourinary:    Comments: deferred Musculoskeletal:     Cervical back: Normal range of motion and neck supple.     Comments: Examination of the right hip  reveals no skin wounds or lesions. Mild trochanteric tenderness to palpation. There is severely restricted range of motion of right hip. Pain in the position of impingement. Pain with terminal flexion rotation.    Neurovascular intact distally.   Ambulates with an antalgic gait.   Skin:    General: Skin is warm and dry.     Capillary Refill: Capillary refill takes less than 2 seconds.  Neurological:     General: No focal deficit present.     Mental Status: She is alert and oriented to person, place, and  time.  Psychiatric:        Mood and Affect: Mood normal.        Behavior: Behavior normal.        Thought Content: Thought content normal.        Judgment: Judgment normal.     Vital signs in last 24 hours: @VSRANGES @  Labs:   Estimated body mass index is 23.69 kg/m as calculated from the following:   Height as of 10/16/22: 5\' 4"  (1.626 m).   Weight as of 10/16/22: 62.6 kg.   Imaging Review Plain radiographs demonstrate severe degenerative joint disease of the right hip(s). The bone quality appears to be adequate for age and reported activity level.      Assessment/Plan:  End stage arthritis, right hip(s)  The patient history, physical examination, clinical judgement of the provider and imaging studies are consistent with end stage degenerative joint disease of the right hip(s) and total hip arthroplasty is deemed medically necessary. The treatment options including medical management, injection therapy, arthroscopy and arthroplasty were discussed at length. The risks and benefits of total hip arthroplasty were presented and reviewed. The risks due to aseptic loosening, infection, stiffness, dislocation/subluxation,  thromboembolic complications and other imponderables were discussed.  The patient acknowledged the explanation, agreed to proceed with the plan and consent was signed. Patient is being admitted for inpatient treatment for surgery, pain control, PT, OT, prophylactic  antibiotics, VTE prophylaxis, progressive ambulation and ADL's and discharge planning.The patient is planning to be discharged home with HEP after an overnight stay.   Therapy Plans: HEP.  Disposition: Home with Ronni Rumble, Nurse.  Planned DVT Prophylaxis: aspirin 325mg  BID DME needed: 3-n-1 and rolling walker.  PCP: Cleared.  Cardiology: Cleared.  TXA: IV Allergies:  - Tetracycline - skin blistering.  Anesthesia Concerns: None.  BMI: 25.7 Last HgbA1c: 5.5 Other: - COPD**  - History of cocaine abuse. Surgery will be canceled again if she tests positive.  - Aspirin 325mg  BID.  - Hydrocodone, zofran.  - 4/824: Albumin 3.3, Cr. 0.80, Hgb 13.3.    Patient's anticipated LOS is less than 2 midnights, meeting these requirements: - Younger than 34 - Lives within 1 hour of care - Has a competent adult at home to recover with post-op recover - NO history of  - Chronic pain requiring opiods  - Diabetes  - Coronary Artery Disease  - Heart failure  - Heart attack  - Stroke  - DVT/VTE  - Cardiac arrhythmia  - Respiratory Failure/COPD  - Renal failure  - Anemia  - Advanced Liver disease

## 2022-10-21 ENCOUNTER — Ambulatory Visit (HOSPITAL_COMMUNITY)
Admission: RE | Admit: 2022-10-21 | Discharge: 2022-10-21 | Disposition: A | Discharge status: Home / Self Care | Payer: Medicare HMO | Source: Ambulatory Visit | Attending: Orthopedic Surgery | Admitting: Orthopedic Surgery

## 2022-10-21 ENCOUNTER — Encounter (HOSPITAL_COMMUNITY)
Admission: RE | Disposition: A | Discharge status: Home / Self Care | Payer: Self-pay | Source: Ambulatory Visit | Attending: Orthopedic Surgery

## 2022-10-21 ENCOUNTER — Encounter (HOSPITAL_COMMUNITY): Payer: Self-pay | Admitting: Orthopedic Surgery

## 2022-10-21 DIAGNOSIS — F1410 Cocaine abuse, uncomplicated: Secondary | ICD-10-CM | POA: Diagnosis not present

## 2022-10-21 DIAGNOSIS — M1611 Unilateral primary osteoarthritis, right hip: Secondary | ICD-10-CM | POA: Diagnosis present

## 2022-10-21 DIAGNOSIS — Z5309 Procedure and treatment not carried out because of other contraindication: Secondary | ICD-10-CM | POA: Diagnosis not present

## 2022-10-21 DIAGNOSIS — Z01818 Encounter for other preprocedural examination: Secondary | ICD-10-CM

## 2022-10-21 DIAGNOSIS — I2510 Atherosclerotic heart disease of native coronary artery without angina pectoris: Secondary | ICD-10-CM

## 2022-10-21 LAB — TYPE AND SCREEN: Antibody Screen: NEGATIVE

## 2022-10-21 LAB — RAPID URINE DRUG SCREEN, HOSP PERFORMED
Amphetamines: NOT DETECTED
Barbiturates: NOT DETECTED
Benzodiazepines: NOT DETECTED
Cocaine: POSITIVE — AB
Opiates: NOT DETECTED
Tetrahydrocannabinol: NOT DETECTED

## 2022-10-21 SURGERY — ARTHROPLASTY, HIP, TOTAL, ANTERIOR APPROACH
Anesthesia: Spinal

## 2022-10-21 MED ORDER — POVIDONE-IODINE 10 % EX SWAB: 2.0000 | Freq: Once | CUTANEOUS | Status: DC

## 2022-10-21 MED ORDER — ACETAMINOPHEN 500 MG PO TABS: 1000.0000 mg | ORAL_TABLET | Freq: Once | ORAL | Status: DC

## 2022-10-21 MED ORDER — ORAL CARE MOUTH RINSE: 15.0000 mL | Freq: Once | OROMUCOSAL | Status: DC

## 2022-10-21 MED ORDER — LACTATED RINGERS IV SOLN: INTRAVENOUS | Status: DC

## 2022-10-21 MED ORDER — TRANEXAMIC ACID-NACL 1000-0.7 MG/100ML-% IV SOLN: 1000.0000 mg | INTRAVENOUS | Status: DC

## 2022-10-21 MED ORDER — CEFAZOLIN SODIUM-DEXTROSE 2-4 GM/100ML-% IV SOLN: 2.0000 g | INTRAVENOUS | Status: DC

## 2022-10-21 MED ORDER — CHLORHEXIDINE GLUCONATE 0.12 % MT SOLN: 15.0000 mL | Freq: Once | OROMUCOSAL | Status: DC

## 2022-10-21 SURGICAL SUPPLY — 52 items
ADH SKN CLS APL DERMABOND .7 (GAUZE/BANDAGES/DRESSINGS) ×2
APL PRP STRL LF DISP 70% ISPRP (MISCELLANEOUS) ×1
BAG COUNTER SPONGE SURGICOUNT (BAG) IMPLANT
BAG DECANTER FOR FLEXI CONT (MISCELLANEOUS) IMPLANT
BAG SPEC THK2 15X12 ZIP CLS (MISCELLANEOUS)
BAG SPNG CNTER NS LX DISP (BAG)
BAG ZIPLOCK 12X15 (MISCELLANEOUS) IMPLANT
CHLORAPREP W/TINT 26 (MISCELLANEOUS) ×2 IMPLANT
COVER PERINEAL POST (MISCELLANEOUS) ×2 IMPLANT
COVER SURGICAL LIGHT HANDLE (MISCELLANEOUS) ×2 IMPLANT
DERMABOND ADVANCED .7 DNX12 (GAUZE/BANDAGES/DRESSINGS) ×4 IMPLANT
DRAPE IMP U-DRAPE 54X76 (DRAPES) ×2 IMPLANT
DRAPE SHEET LG 3/4 BI-LAMINATE (DRAPES) ×6 IMPLANT
DRAPE STERI IOBAN 125X83 (DRAPES) ×2 IMPLANT
DRAPE U-SHAPE 47X51 STRL (DRAPES) ×4 IMPLANT
DRSG AQUACEL AG ADV 3.5X10 (GAUZE/BANDAGES/DRESSINGS) ×2 IMPLANT
ELECT REM PT RETURN 15FT ADLT (MISCELLANEOUS) ×2 IMPLANT
GAUZE SPONGE 4X4 12PLY STRL (GAUZE/BANDAGES/DRESSINGS) ×2 IMPLANT
GLOVE BIO SURGEON STRL SZ7 (GLOVE) ×2 IMPLANT
GLOVE BIO SURGEON STRL SZ8.5 (GLOVE) ×4 IMPLANT
GLOVE BIOGEL PI IND STRL 7.5 (GLOVE) ×2 IMPLANT
GLOVE BIOGEL PI IND STRL 8.5 (GLOVE) ×2 IMPLANT
GOWN SPEC L3 XXLG W/TWL (GOWN DISPOSABLE) ×2 IMPLANT
GOWN STRL REUS W/ TWL XL LVL3 (GOWN DISPOSABLE) ×2 IMPLANT
GOWN STRL REUS W/TWL XL LVL3 (GOWN DISPOSABLE) ×1
HANDPIECE INTERPULSE COAX TIP (DISPOSABLE) ×1
HOLDER FOLEY CATH W/STRAP (MISCELLANEOUS) ×2 IMPLANT
HOOD PEEL AWAY T7 (MISCELLANEOUS) ×6 IMPLANT
KIT TURNOVER KIT A (KITS) IMPLANT
MANIFOLD NEPTUNE II (INSTRUMENTS) ×2 IMPLANT
MARKER SKIN DUAL TIP RULER LAB (MISCELLANEOUS) ×2 IMPLANT
NDL SAFETY ECLIP 18X1.5 (MISCELLANEOUS) ×2 IMPLANT
NDL SPNL 18GX3.5 QUINCKE PK (NEEDLE) ×1 IMPLANT
NEEDLE SPNL 18GX3.5 QUINCKE PK (NEEDLE) ×1 IMPLANT
PACK ANTERIOR HIP CUSTOM (KITS) ×2 IMPLANT
PENCIL SMOKE EVACUATOR (MISCELLANEOUS) IMPLANT
SAW OSC TIP CART 19.5X105X1.3 (SAW) ×2 IMPLANT
SEALER BIPOLAR AQUA 6.0 (INSTRUMENTS) ×2 IMPLANT
SET HNDPC FAN SPRY TIP SCT (DISPOSABLE) ×2 IMPLANT
SOLUTION PRONTOSAN WOUND 350ML (IRRIGATION / IRRIGATOR) ×2 IMPLANT
SPIKE FLUID TRANSFER (MISCELLANEOUS) ×2 IMPLANT
SUT MNCRL AB 3-0 PS2 18 (SUTURE) ×2 IMPLANT
SUT MON AB 2-0 CT1 36 (SUTURE) ×2 IMPLANT
SUT STRATAFIX PDO 1 14 VIOLET (SUTURE) ×1
SUT STRATFX PDO 1 14 VIOLET (SUTURE) ×1
SUT VIC AB 2-0 CT1 27 (SUTURE)
SUT VIC AB 2-0 CT1 TAPERPNT 27 (SUTURE) IMPLANT
SUTURE STRATFX PDO 1 14 VIOLET (SUTURE) ×2 IMPLANT
SYR 3ML LL SCALE MARK (SYRINGE) ×2 IMPLANT
TRAY FOLEY MTR SLVR 16FR STAT (SET/KITS/TRAYS/PACK) IMPLANT
TUBE SUCTION HIGH CAP CLEAR NV (SUCTIONS) ×2 IMPLANT
WATER STERILE IRR 1000ML POUR (IV SOLUTION) ×2 IMPLANT

## 2022-10-21 NOTE — Progress Notes (Signed)
UDS positive for cocaine. Will cancel surgery due to cardiac risk.

## 2022-10-29 ENCOUNTER — Ambulatory Visit (INDEPENDENT_AMBULATORY_CARE_PROVIDER_SITE_OTHER): Payer: Medicare HMO | Admitting: Orthopaedic Surgery

## 2022-10-29 ENCOUNTER — Other Ambulatory Visit (INDEPENDENT_AMBULATORY_CARE_PROVIDER_SITE_OTHER): Payer: Medicare HMO

## 2022-10-29 ENCOUNTER — Telehealth: Payer: Self-pay | Admitting: Orthopaedic Surgery

## 2022-10-29 ENCOUNTER — Other Ambulatory Visit: Payer: Self-pay

## 2022-10-29 ENCOUNTER — Encounter: Payer: Self-pay | Admitting: Orthopaedic Surgery

## 2022-10-29 DIAGNOSIS — M1611 Unilateral primary osteoarthritis, right hip: Secondary | ICD-10-CM

## 2022-10-29 DIAGNOSIS — M1712 Unilateral primary osteoarthritis, left knee: Secondary | ICD-10-CM

## 2022-10-29 MED ORDER — BUPIVACAINE HCL 0.5 % IJ SOLN
2.0000 mL | INTRAMUSCULAR | Status: AC | PRN
Start: 2022-10-29 — End: 2022-10-29
  Administered 2022-10-29: 2 mL via INTRA_ARTICULAR

## 2022-10-29 MED ORDER — METHYLPREDNISOLONE ACETATE 40 MG/ML IJ SUSP
40.0000 mg | INTRAMUSCULAR | Status: AC | PRN
Start: 2022-10-29 — End: 2022-10-29
  Administered 2022-10-29: 40 mg via INTRA_ARTICULAR

## 2022-10-29 MED ORDER — LIDOCAINE HCL 1 % IJ SOLN
2.0000 mL | INTRAMUSCULAR | Status: AC | PRN
Start: 2022-10-29 — End: 2022-10-29
  Administered 2022-10-29: 2 mL

## 2022-10-29 NOTE — Progress Notes (Signed)
Office Visit Note   Patient: Terry Potter           Date of Birth: 1955-10-18           MRN: [id number] Visit Date: 10/29/2022              Requested by: Loura Back, NP [address] [place],  [place] [address] PCP: Loura Back, NP   Assessment & Plan: Visit Diagnoses:  1. Primary osteoarthritis of right hip   2. Primary osteoarthritis of left knee     Plan: Impression is 67 year old female with end-stage right hip and left knee DJD.  In regards to the right hip she has elected to undergo right total hip replacement as soon as possible.  Risk benefits prognosis reviewed with the patient.  She did undergo a left total hip replacement a few years ago for femoral neck fracture and she was able to rehab at home with home health services.  I anticipate that she will be available to do the same with the right hip.  In regards to the left knee she has a severe varus DJD.  Ultimately she will need a knee replacement but for now she would like to get the hip taken care of first.  We did a cortisone injection and gave her a hinged knee brace.  Follow-Up Instructions: No follow-ups on file.   Orders:  Orders Placed This Encounter  Procedures   XR KNEE 3 VIEW LEFT   No orders of the defined types were placed in this encounter.     Procedures: Large Joint Inj: L knee on 10/29/2022 11:26 AM Details: 22 G needle Medications: 2 mL bupivacaine 0.5 %; 2 mL lidocaine 1 %; 40 mg methylPREDNISolone acetate 40 MG/ML Outcome: tolerated well, no immediate complications Patient was prepped and draped in the usual sterile fashion.       Clinical Data: No additional findings.   Subjective: Chief Complaint  Patient presents with   Right Hip - Pain    HPI Terry Potter is a 67 year old female here for evaluation of 2 separate issues.  The first problem is chronic right hip pain with advanced DJD.  Previously was a patient of Dr. Kathline Magic but her surgery was canceled multiple times for positive  cocaine on UDS.  She has decided to switch her care to [place].  Other problem is severe left knee pain for many years.  She has an altered gait and she walks with a cane.  Review of Systems  Constitutional: Negative.   HENT: Negative.    Eyes: Negative.   Respiratory: Negative.    Cardiovascular: Negative.   Endocrine: Negative.   Musculoskeletal: Negative.   Neurological: Negative.   Hematological: Negative.   Psychiatric/Behavioral: Negative.    All other systems reviewed and are negative.    Objective: Vital Signs: There were no vitals taken for this visit.  Physical Exam Vitals and nursing note reviewed.  Constitutional:      Appearance: She is well-developed.  HENT:     Head: Atraumatic.     Nose: Nose normal.  Eyes:     Extraocular Movements: Extraocular movements intact.  Cardiovascular:     Pulses: Normal pulses.  Pulmonary:     Effort: Pulmonary effort is normal.  Abdominal:     Palpations: Abdomen is soft.  Musculoskeletal:     Cervical back: Neck supple.  Skin:    General: Skin is warm.     Capillary Refill: Capillary refill takes less than 2 seconds.  Neurological:     Mental Status: She is alert. Mental status is at baseline.  Psychiatric:        Behavior: Behavior normal.        Thought Content: Thought content normal.        Judgment: Judgment normal.     Ortho Exam Examination right hip shows normal skin.  She has significant pain with any attempted range of motion of the hip joint.  Examination left knee shows varus thrust.  Medial joint line tenderness.  Pain and crepitus with range of motion.  Pain with flexion of the knee past 60 degrees. Specialty Comments:  No specialty comments available.  Imaging: XR KNEE 3 VIEW LEFT  Result Date: 10/29/2022 Three-view x-rays of the left knee shows advanced degenerative joint disease with a severe varus deformity.  Bone-on-bone joint space narrowing of the medial and patellofemoral compartments.  Widening  of the lateral compartment consistent with chronic posterior lateral corner insufficiency.    PMFS History: Patient Active Problem List   Diagnosis Date Noted   Osteoarthritis of right hip 08/19/2022   Grief 01/23/2021   PTSD (post-traumatic stress disorder) 08/13/2020   Cocaine use disorder, severe, in controlled environment (HCC) 07/23/2020   Cocaine abuse with cocaine-induced mood disorder (HCC) 07/23/2020   Bacterial vaginosis 02/01/2018   Cocaine abuse (HCC) 02/01/2018   Pulmonary nodules 02/01/2018   Syncope 02/01/2018   Hip fracture, unspecified laterality, closed, initial encounter (HCC) 01/01/2017   Hip fracture (HCC) 01/01/2017   Displaced fracture of left femoral neck (HCC) 01/01/2017   Schizoaffective disorder, bipolar type (HCC) 03/19/2015    Class: Chronic   Past Medical History:  Diagnosis Date   Abnormal EKG    Anxiety    Arthritis    Asthma    Bipolar disorder (HCC)    Depression    Ectopic pregnancy    Hip pain    Marijuana use    Protein-calorie malnutrition, severe (HCC)    Shoulder pain    Tobacco dependence    cocaine ,Marijuana    Family History  Problem Relation Age of Onset   Cancer Mother     Past Surgical History:  Procedure Laterality Date   LAPAROSCOPY FOR ECTOPIC PREGNANCY     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   Procedure: TOTAL HIP ARTHROPLASTY ANTERIOR APPROACH;  Surgeon: Samson Frederic, MD;  Location: [facility] OR;  Service: Orthopedics;  Laterality: Left;   TUMOR REMOVAL  1990   Uterine tumor removed   Social History   Occupational History   Not on file  Tobacco Use   Smoking status: Every Day    Packs/day: 0.50    Years: 40.00    Additional pack years: 0.00    Total pack years: 20.00    Types: Cigarettes, Cigars   Smokeless tobacco: Never  Vaping Use   Vaping Use: Never used  Substance and Sexual Activity   Alcohol use: Not Currently    Alcohol/week: 2.0 standard drinks of alcohol    Types: 2 Shots of liquor per week     Comment: occ   Drug use: Not Currently    Comment: occ, last dose 08/01/22, pt reports less than monthly use   Sexual activity: Not on file

## 2022-10-29 NOTE — Telephone Encounter (Signed)
Xu, do you want me to send in pain meds to take prior to sx with her drug hx?  Ok to continue asa

## 2022-10-29 NOTE — Telephone Encounter (Signed)
We can send it in after surgery.  Thanks for checking.

## 2022-10-29 NOTE — Telephone Encounter (Signed)
Patient is scheduled for right total hip arthroplasty 11-23-22 with Dr Roda Shutters and would like to know if pain prescription could be called in at [facility] on [facility].  Please let patient know if and when this is done.    Also patient needs to know if she should hold or continue her 81 mg ASA for her hip surgery.   Please advise.  [phone]

## 2022-10-30 ENCOUNTER — Other Ambulatory Visit: Payer: Self-pay | Admitting: Physician Assistant

## 2022-10-30 MED ORDER — TRAMADOL HCL 50 MG PO TABS: 50.0000 mg | ORAL_TABLET | Freq: Two times a day (BID) | ORAL | 0 refills | Status: DC | PRN

## 2022-10-30 NOTE — Telephone Encounter (Signed)
I have sent in tramadol.  We cannot send in any narcotics until after surgery

## 2022-10-30 NOTE — Telephone Encounter (Signed)
Called and LMOM 

## 2022-11-04 ENCOUNTER — Telehealth: Payer: Self-pay | Admitting: Orthopaedic Surgery

## 2022-11-04 NOTE — Telephone Encounter (Signed)
Patient called advised the Rx for Tramadol need prior approval before it can be filled. The number to contact patient is [phone] Patient asked for a call when she can pick up her medication.

## 2022-11-05 NOTE — Telephone Encounter (Signed)
Notified patient that the Tramadol is contraindicated because of another medication she is on, per Cover my Meds. Terry Potter said we will hold off on prescribing anything until after surgery. She understands and is okay with that.

## 2022-11-11 NOTE — Progress Notes (Addendum)
Surgical Instructions    Your procedure is scheduled on Monday June 10th.  Report to [facility] Main Entrance "A" at 8 A.M., then check in with the Admitting office.  Call this number if you have problems the morning of surgery:  [phone]   If you have any questions prior to your surgery date call [phone]: Open Monday-Friday 8am-4pm If you experience any cold or flu symptoms such as cough, fever, chills, shortness of breath, etc. between now and your scheduled surgery, please notify us at the above number     Remember:  Do not eat after midnight the night before your surgery  You may drink clear liquids until 7:30am the morning of your surgery.   Clear liquids allowed are: Water, Non-Citrus Juices (without pulp), Carbonated Beverages, Clear Tea, Black Coffee ONLY (NO MILK, CREAM OR POWDERED CREAMER of any kind), and Gatorade    Take these medicines the morning of surgery with A SIP OF WATER: gabapentin (NEURONTIN) 300 MG capsule  loratadine (CLARITIN) 10 MG tablet  Oxcarbazepine (TRILEPTAL) 300 MG table   IF NEEDED  acetaminophen (TYLENOL) 500 MG tablet  budesonide-formoterol (SYMBICORT) 160-4.5 MCG/ACT inhaler - please bring with you to the hospital fluticasone (FLONASE) 50 MCG/ACT nasal spray   Follow your surgeon's instructions on when to stop Aspirin.  If no instructions were given by your surgeon then you will need to call the office to get those instructions.     As of today, STOP taking any Aspirin (unless otherwise instructed by your surgeon) MOBIC, Aleve, Naproxen, Ibuprofen, Motrin, Advil, Goody's, BC's, all herbal medications, fish oil, and all vitamins.           Do not wear jewelry or makeup. Do not wear lotions, powders, perfumes or deodorant. Do not shave 48 hours prior to surgery.   Do not bring valuables to the hospital. Do not wear nail polish, gel polish, artificial nails, or any other type of covering on natural nails (fingers and toes) If you have  artificial nails or gel coating that need to be removed by a nail salon, please have this removed prior to surgery. Artificial nails or gel coating may interfere with anesthesia's ability to adequately monitor your vital signs.  [facility] is not responsible for any belongings or valuables.    Do NOT Smoke (Tobacco/Vaping)  24 hours prior to your procedure  If you use a CPAP at night, you may bring your mask for your overnight stay.   Contacts, glasses, hearing aids, dentures or partials may not be worn into surgery, please bring cases for these belongings   For patients admitted to the hospital, discharge time will be determined by your treatment team.   Patients discharged the day of surgery will not be allowed to drive home, and someone needs to stay with them for 24 hours.   SURGICAL WAITING ROOM VISITATION Patients having surgery or a procedure may have no more than 2 support people in the waiting area - these visitors may rotate.   Children under the age of 7 must have an adult with them who is not the patient. If the patient needs to stay at the hospital during part of their recovery, the visitor guidelines for inpatient rooms apply. Pre-op nurse will coordinate an appropriate time for 1 support person to accompany patient in pre-op.  This support person may not rotate.   Please refer to [email or web address] for the visitor guidelines for Inpatients (after your surgery is over and you are in a regular  room).    Special instructions:    Oral Hygiene is also important to reduce your risk of infection.  Remember - BRUSH YOUR TEETH THE MORNING OF SURGERY WITH YOUR REGULAR TOOTHPASTE   [place]- Preparing For Surgery  Before surgery, you can play an important role. Because skin is not sterile, your skin needs to be as free of germs as possible. You can reduce the number of germs on your skin by washing with CHG (chlorahexidine  gluconate) Soap before surgery.  CHG is an antiseptic cleaner which kills germs and bonds with the skin to continue killing germs even after washing.     Please do not use if you have an allergy to CHG or antibacterial soaps. If your skin becomes reddened/irritated stop using the CHG.  Do not shave (including legs and underarms) for at least 48 hours prior to first CHG shower. It is OK to shave your face.  Please follow these instructions carefully.      Pre-operative 5 CHG Bath Instructions   You can play a key role in reducing the risk of infection after surgery. Your skin needs to be as free of germs as possible. You can reduce the number of germs on your skin by washing with CHG (chlorhexidine gluconate) soap before surgery. CHG is an antiseptic soap that kills germs and continues to kill germs even after washing.   DO NOT use if you have an allergy to chlorhexidine/CHG or antibacterial soaps. If your skin becomes reddened or irritated, stop using the CHG and notify one of our RNs at [phone].   Please shower with the CHG soap starting 4 days before surgery using the following schedule:     Please keep in mind the following:  DO NOT shave, including legs and underarms, starting the day of your first shower.   You may shave your face at any point before/day of surgery.  Place clean sheets on your bed the day you start using CHG soap. Use a clean washcloth (not used since being washed) for each shower. DO NOT sleep with pets once you start using the CHG.   CHG Shower Instructions:  If you choose to wash your hair and private area, wash first with your normal shampoo/soap.  After you use shampoo/soap, rinse your hair and body thoroughly to remove shampoo/soap residue.  Turn the water OFF and apply about 3 tablespoons (45 ml) of CHG soap to a CLEAN washcloth.  Apply CHG soap ONLY FROM YOUR NECK DOWN TO YOUR TOES (washing for 3-5 minutes)  DO NOT use CHG soap on face, private  areas, open wounds, or sores.  Pay special attention to the area where your surgery is being performed.  If you are having back surgery, having someone wash your back for you may be helpful. Wait 2 minutes after CHG soap is applied, then you may rinse off the CHG soap.  Pat dry with a clean towel  Put on clean clothes/pajamas   If you choose to wear lotion, please use ONLY the CHG-compatible lotions on the back of this paper.     Additional instructions for the day of surgery: DO NOT APPLY any lotions, deodorants, cologne, or perfumes.   Put on clean/comfortable clothes.  Brush your teeth.  Ask your nurse before applying any prescription medications to the skin.      CHG Compatible Lotions   Aveeno Moisturizing lotion  Cetaphil Moisturizing Cream  Cetaphil Moisturizing Lotion  Clairol Herbal Essence Moisturizing Lotion, Dry Skin  Clairol Herbal Essence Moisturizing Lotion, Extra Dry Skin  Clairol Herbal Essence Moisturizing Lotion, Normal Skin  Curel Age Defying Therapeutic Moisturizing Lotion with Alpha Hydroxy  Curel Extreme Care Body Lotion  Curel Soothing Hands Moisturizing Hand Lotion  Curel Therapeutic Moisturizing Cream, Fragrance-Free  Curel Therapeutic Moisturizing Lotion, Fragrance-Free  Curel Therapeutic Moisturizing Lotion, Original Formula  Eucerin Daily Replenishing Lotion  Eucerin Dry Skin Therapy Plus Alpha Hydroxy Crme  Eucerin Dry Skin Therapy Plus Alpha Hydroxy Lotion  Eucerin Original Crme  Eucerin Original Lotion  Eucerin Plus Crme Eucerin Plus Lotion  Eucerin TriLipid Replenishing Lotion  Keri Anti-Bacterial Hand Lotion  Keri Deep Conditioning Original Lotion Dry Skin Formula Softly Scented  Keri Deep Conditioning Original Lotion, Fragrance Free Sensitive Skin Formula  Keri Lotion Fast Absorbing Fragrance Free Sensitive Skin Formula  Keri Lotion Fast Absorbing Softly Scented Dry Skin Formula  Keri Original Lotion  Keri Skin Renewal Lotion Keri  Silky Smooth Lotion  Keri Silky Smooth Sensitive Skin Lotion  Nivea Body Creamy Conditioning Oil  Nivea Body Extra Enriched Lotion  Nivea Body Original Lotion  Nivea Body Sheer Moisturizing Lotion Nivea Crme  Nivea Skin Firming Lotion  NutraDerm 30 Skin Lotion  NutraDerm Skin Lotion  NutraDerm Therapeutic Skin Cream  NutraDerm Therapeutic Skin Lotion  ProShield Protective Hand Cream  Provon moisturizing lotion    Day of Surgery:  Take a shower with CHG soap. Wear Clean/Comfortable clothing the morning of surgery Do not apply any deodorants/lotions.   Remember to brush your teeth WITH YOUR REGULAR TOOTHPASTE.    If you received a COVID test during your pre-op visit, it is requested that you wear a mask when out in public, stay away from anyone that may not be feeling well, and notify your surgeon if you develop symptoms. If you have been in contact with anyone that has tested positive in the last 10 days, please notify your surgeon.    Please read over the following fact sheets that you were given.

## 2022-11-12 ENCOUNTER — Encounter (HOSPITAL_COMMUNITY)
Admission: RE | Admit: 2022-11-12 | Discharge: 2022-11-12 | Disposition: A | Discharge status: Home / Self Care | Payer: Medicare HMO | Source: Ambulatory Visit | Attending: Orthopaedic Surgery | Admitting: Orthopaedic Surgery

## 2022-11-12 ENCOUNTER — Ambulatory Visit (HOSPITAL_COMMUNITY): Payer: Medicare HMO | Admitting: Anesthesiology

## 2022-11-12 ENCOUNTER — Ambulatory Visit (HOSPITAL_COMMUNITY): Payer: Medicare HMO | Admitting: Physician Assistant

## 2022-11-12 ENCOUNTER — Encounter (HOSPITAL_COMMUNITY): Payer: Self-pay

## 2022-11-12 ENCOUNTER — Other Ambulatory Visit: Payer: Self-pay

## 2022-11-12 VITALS — BP 117/89 | HR 65 | Temp 98.3°F | Resp 17 | Ht 64.0 in | Wt 141.7 lb

## 2022-11-12 DIAGNOSIS — F1910 Other psychoactive substance abuse, uncomplicated: Secondary | ICD-10-CM | POA: Diagnosis not present

## 2022-11-12 DIAGNOSIS — Z01812 Encounter for preprocedural laboratory examination: Secondary | ICD-10-CM | POA: Insufficient documentation

## 2022-11-12 DIAGNOSIS — M1611 Unilateral primary osteoarthritis, right hip: Secondary | ICD-10-CM | POA: Insufficient documentation

## 2022-11-12 DIAGNOSIS — Z01818 Encounter for other preprocedural examination: Secondary | ICD-10-CM

## 2022-11-12 LAB — COMPREHENSIVE METABOLIC PANEL
ALT: 15 U/L (ref 0–44)
AST: 19 U/L (ref 15–41)
Albumin: 3.6 g/dL (ref 3.5–5.0)
Alkaline Phosphatase: 84 U/L (ref 38–126)
Anion gap: 6 (ref 5–15)
BUN: 14 mg/dL (ref 8–23)
CO2: 25 mmol/L (ref 22–32)
Calcium: 8.7 mg/dL — ABNORMAL LOW (ref 8.9–10.3)
Chloride: 106 mmol/L (ref 98–111)
Creatinine, Ser: 0.76 mg/dL (ref 0.44–1.00)
GFR, Estimated: >60 mL/min (ref >60)
Glucose, Bld: 90 mg/dL (ref 70–99)
Potassium: 4.3 mmol/L (ref 3.5–5.1)
Sodium: 137 mmol/L (ref 135–145)
Total Bilirubin: 0.6 mg/dL (ref 0.3–1.2)
Total Protein: 7 g/dL (ref 6.5–8.1)

## 2022-11-12 LAB — CBC
HCT: 38.5 % (ref 36.0–46.0)
Hemoglobin: 12.5 g/dL (ref 12.0–15.0)
MCH: 31.3 pg (ref 26.0–34.0)
MCHC: 32.5 g/dL (ref 30.0–36.0)
MCV: 96.3 fL (ref 80.0–100.0)
Platelets: 201 10*3/uL (ref 150–400)
RBC: 4 MIL/uL (ref 3.87–5.11)
RDW: 14.4 % (ref 11.5–15.5)
WBC: 5.5 10*3/uL (ref 4.0–10.5)
nRBC: 0 % (ref 0.0–0.2)

## 2022-11-12 LAB — SURGICAL PCR SCREEN
MRSA, PCR: NEGATIVE
Staphylococcus aureus: NEGATIVE

## 2022-11-12 LAB — TYPE AND SCREEN
ABO/RH(D): A POS
Antibody Screen: NEGATIVE

## 2022-11-12 LAB — PREALBUMIN: Prealbumin: 23 mg/dL (ref 18–38)

## 2022-11-12 NOTE — Progress Notes (Signed)
PCP - Loura Back, NP Cardiologist - Donato Schultz  PPM/ICD - denies   Chest x-ray - N/A EKG - 09/21/22 Stress Test - 05/28/22 ECHO - 05/28/22 Cardiac Cath - denies  Sleep Study - denies   Fasting Blood Sugar - N/A- A1c checked per order by Dr. Roda Shutters   Last dose of GLP1 agonist-  N/A   Blood Thinner Instructions: N/A Aspirin Instructions: Follow your surgeon's instructions on when to stop Aspirin.  If no instructions were given by your surgeon then you will need to call the office to get those instructions.   Pt states she will call Dr. Warren Danes office for aspirin instructions as well as to ask about equipment needs for after surgery.    ERAS Protcol - ERAS- drink ordered- pt left without receiving ERAS drink ordered by surgeon.    COVID TEST- N/A   Anesthesia review: yes- patient has been cancelled multiple times for surgery d/t UDS +. I confirmed with Dr. Roda Shutters regarding ordered UDS- UDS to be drawn on DOS per MD. Pt states this is her third clean week of not using cocaine. Pt educated on importance of not using cocaine/drugs again prior to surgery. Pt verbalized understanding.   Pt states she has been drinking an air plane bottle of liquor a few times a week to help her pain. Pt states 3-4 air plane bottles a week. Pt states that she is not drinking daily. Pt instructed that she should not drink alcohol on the day of surgery. Pt also instructed to not smoke anything for 24 hours prior to surgery.   Patient denies shortness of breath, fever, cough and chest pain at PAT appointment   All instructions explained to the patient, with a verbal understanding of the material. Patient agrees to go over the instructions while at home for a better understanding. The opportunity to ask questions was provided.

## 2022-11-13 LAB — HEMOGLOBIN A1C
Hgb A1c MFr Bld: 5.6 % (ref 4.8–5.6)
Mean Plasma Glucose: 114 mg/dL

## 2022-11-13 NOTE — Anesthesia Preprocedure Evaluation (Signed)
Anesthesia Evaluation    Airway        Dental   Pulmonary Current Smoker and Patient abstained from smoking.          Cardiovascular      Neuro/Psych    GI/Hepatic   Endo/Other    Renal/GU      Musculoskeletal   Abdominal   Peds  Hematology   Anesthesia Other Findings   Reproductive/Obstetrics                              Anesthesia Physical Anesthesia Plan  ASA:   Anesthesia Plan:    Post-op Pain Management:    Induction:   PONV Risk Score and Plan:   Airway Management Planned:   Additional Equipment:   Intra-op Plan:   Post-operative Plan:   Informed Consent:   Plan Discussed with:   Anesthesia Plan Comments: (See recent PAT note by Christeen Douglas, PA-C 10/16/2022.  Surgery has previously been canceled x 2 due to UDS positive for cocaine on day of surgery.  Patient to have UDS on day of surgery again per Dr. Roda Shutters.  )         Anesthesia Quick Evaluation

## 2022-11-17 ENCOUNTER — Other Ambulatory Visit: Payer: Self-pay | Admitting: Physician Assistant

## 2022-11-17 MED ORDER — DOCUSATE SODIUM 100 MG PO CAPS: 100.0000 mg | ORAL_CAPSULE | Freq: Every day | ORAL | 2 refills | Status: AC | PRN

## 2022-11-17 MED ORDER — ONDANSETRON HCL 4 MG PO TABS: 4.0000 mg | ORAL_TABLET | Freq: Three times a day (TID) | ORAL | 0 refills | Status: AC | PRN

## 2022-11-17 MED ORDER — CEFADROXIL 500 MG PO CAPS: 500.0000 mg | ORAL_CAPSULE | Freq: Two times a day (BID) | ORAL | 0 refills | Status: DC

## 2022-11-18 ENCOUNTER — Telehealth: Payer: Self-pay | Admitting: Orthopaedic Surgery

## 2022-11-18 NOTE — Telephone Encounter (Signed)
She needs to stop it now.

## 2022-11-18 NOTE — Telephone Encounter (Signed)
Called and advised pt.

## 2022-11-18 NOTE — Telephone Encounter (Signed)
Patient would like to know does she need to stop her Asprin and when and when can she start back after procedure  RIGHT TOTAL HIP ARTHROPLASTY ANTERIOR APPROACH  11/23/2022

## 2022-11-20 MED ORDER — TRANEXAMIC ACID 1000 MG/10ML IV SOLN
2000.0000 mg | INTRAVENOUS | Status: DC
  Filled 2022-11-20 (×2): qty 20

## 2022-11-23 ENCOUNTER — Encounter (HOSPITAL_COMMUNITY)
Admission: RE | Disposition: A | Discharge status: Home / Self Care | Payer: Self-pay | Source: Home / Self Care | Attending: Orthopaedic Surgery

## 2022-11-23 ENCOUNTER — Other Ambulatory Visit: Payer: Self-pay

## 2022-11-23 ENCOUNTER — Ambulatory Visit (HOSPITAL_COMMUNITY): Payer: Medicare HMO

## 2022-11-23 ENCOUNTER — Encounter (HOSPITAL_COMMUNITY): Payer: Self-pay | Admitting: Orthopaedic Surgery

## 2022-11-23 ENCOUNTER — Ambulatory Visit (HOSPITAL_COMMUNITY)
Admission: RE | Admit: 2022-11-23 | Discharge: 2022-11-23 | Disposition: A | Discharge status: Home / Self Care | Payer: Medicare HMO | Attending: Orthopaedic Surgery | Admitting: Orthopaedic Surgery

## 2022-11-23 DIAGNOSIS — Z538 Procedure and treatment not carried out for other reasons: Secondary | ICD-10-CM | POA: Insufficient documentation

## 2022-11-23 DIAGNOSIS — M1611 Unilateral primary osteoarthritis, right hip: Secondary | ICD-10-CM | POA: Diagnosis present

## 2022-11-23 DIAGNOSIS — F17210 Nicotine dependence, cigarettes, uncomplicated: Secondary | ICD-10-CM | POA: Diagnosis not present

## 2022-11-23 LAB — RAPID URINE DRUG SCREEN, HOSP PERFORMED
Amphetamines: NOT DETECTED
Barbiturates: NOT DETECTED
Benzodiazepines: NOT DETECTED
Cocaine: POSITIVE — AB
Opiates: NOT DETECTED
Tetrahydrocannabinol: NOT DETECTED

## 2022-11-23 SURGERY — ARTHROPLASTY, HIP, TOTAL, ANTERIOR APPROACH
Anesthesia: Spinal | Site: Hip | Laterality: Right

## 2022-11-23 MED ORDER — POVIDONE-IODINE 10 % EX SWAB
2.0000 | Freq: Once | CUTANEOUS | Status: AC
  Administered 2022-11-23: 2 via TOPICAL

## 2022-11-23 MED ORDER — TRANEXAMIC ACID-NACL 1000-0.7 MG/100ML-% IV SOLN
1000.0000 mg | INTRAVENOUS | Status: DC
  Filled 2022-11-23: qty 100

## 2022-11-23 MED ORDER — LACTATED RINGERS IV SOLN: INTRAVENOUS | Status: DC

## 2022-11-23 MED ORDER — CEFAZOLIN SODIUM-DEXTROSE 2-4 GM/100ML-% IV SOLN
2.0000 g | INTRAVENOUS | Status: DC
  Filled 2022-11-23: qty 100

## 2022-11-23 MED ORDER — ORAL CARE MOUTH RINSE: 15.0000 mL | Freq: Once | OROMUCOSAL | Status: AC

## 2022-11-23 MED ORDER — CHLORHEXIDINE GLUCONATE 0.12 % MT SOLN
15.0000 mL | Freq: Once | OROMUCOSAL | Status: AC
  Administered 2022-11-23: 15 mL via OROMUCOSAL
  Filled 2022-11-23: qty 15

## 2022-11-23 NOTE — H&P (Signed)
PREOPERATIVE H&P  Chief Complaint: right hip osteoarthritis  HPI: Terry Potter is a 67 y.o. female who presents for surgical treatment of right hip osteoarthritis.  She denies any changes in medical history.  Past Medical History:  Diagnosis Date   Abnormal EKG    Anxiety    Arthritis    Asthma    Bipolar disorder (HCC)    Depression    Ectopic pregnancy    Hip pain    Marijuana use    Protein-calorie malnutrition, severe (HCC)    Shoulder pain    Tobacco dependence    cocaine ,Marijuana   Past Surgical History:  Procedure Laterality Date   LAPAROSCOPY FOR ECTOPIC PREGNANCY     TONSILLECTOMY     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   Procedure: TOTAL HIP ARTHROPLASTY ANTERIOR APPROACH;  Surgeon: Samson Frederic, MD;  Location: [facility] OR;  Service: Orthopedics;  Laterality: Left;   TUMOR REMOVAL  1990   Uterine tumor removed   Social History   Socioeconomic History   Marital status: Divorced    Spouse name: Not on file   Number of children: Not on file   Years of education: Not on file   Highest education level: Not on file  Occupational History   Not on file  Tobacco Use   Smoking status: Every Day    Packs/day: 0.50    Years: 40.00    Additional pack years: 0.00    Total pack years: 20.00    Types: Cigarettes, Cigars   Smokeless tobacco: Never  Vaping Use   Vaping Use: Never used  Substance and Sexual Activity   Alcohol use: Yes    Alcohol/week: 3.0 - 4.0 standard drinks of alcohol    Types: 3 - 4 Shots of liquor per week    Comment: weekly   Drug use: Not Currently    Types: Marijuana, Cocaine    Comment: not using marijuana regularly, pt reports not using in 3 weeks as of 11/02/22   Sexual activity: Not on file  Other Topics Concern   Not on file  Social History Narrative   Not on file   Social Determinants of Health   Financial Resource Strain: Not on file  Food Insecurity: Not on file  Transportation Needs: Not on file  Physical  Activity: Not on file  Stress: Not on file  Social Connections: Not on file   Family History  Problem Relation Age of Onset   Cancer Mother    Allergies  Allergen Reactions   Tetracyclines & Related Hives   Prior to Admission medications   Medication Sig Start Date End Date Taking? Authorizing Provider  acetaminophen (TYLENOL) 500 MG tablet Take 1,000 mg by mouth every 6 (six) hours as needed for moderate pain.   Yes [provider]  aspirin EC 325 MG EC tablet Take 1 tablet (325 mg total) by mouth 2 (two) times daily with a meal. Patient taking differently: Take 81 mg by mouth 2 (two) times daily with a meal. 01/03/17  Yes [place], Hospital doctor, PA-C  budesonide-formoterol (SYMBICORT) 160-4.5 MCG/ACT inhaler Inhale 2 puffs into the lungs daily as needed (Asthma).   Yes [provider]  cefadroxil (DURICEF) 500 MG capsule Take 1 capsule (500 mg total) by mouth 2 (two) times daily. To be taken after surgery 11/17/22   Cristie Hem, PA-C  docusate sodium (COLACE) 100 MG capsule Take 1 capsule (100 mg total) by mouth daily as needed. 11/17/22 11/17/23  Dub Mikes,  Mary L, PA-C  fluticasone (FLONASE) 50 MCG/ACT nasal spray Place 1 spray into both nostrils daily as needed for rhinitis.   Yes [provider]  gabapentin (NEURONTIN) 300 MG capsule Take 1 capsule (300 mg total) by mouth 3 (three) times daily. 04/28/21  Yes Toy Cookey E, NP  hydrOXYzine (VISTARIL) 25 MG capsule Take 25 mg by mouth 3 (three) times daily as needed (Sleep).   Yes [provider]  loratadine (CLARITIN) 10 MG tablet Take 10 mg by mouth daily.   Yes [provider]  mirtazapine (REMERON) 15 MG tablet Take 1 tablet (15 mg total) by mouth at bedtime. 04/28/21  Yes Toy Cookey E, NP  Multiple Vitamins-Minerals (AIRBORNE PO) Take 1 tablet by mouth daily.   Yes [provider]  Multiple Vitamins-Minerals (MULTIVITAMIN WITH MINERALS) tablet Take 1 tablet by mouth daily.  Centrum   Yes [provider]  naproxen (NAPROSYN) 500 MG tablet Take 500-1,000 mg by mouth 2 (two) times daily as needed for mild pain or moderate pain. 07/20/22  Yes [provider]  ondansetron (ZOFRAN) 4 MG tablet Take 1 tablet (4 mg total) by mouth every 8 (eight) hours as needed for nausea or vomiting. 11/17/22   Cristie Hem, PA-C  Oxcarbazepine (TRILEPTAL) 300 MG tablet Take 1 tablet (300 mg total) by mouth 3 (three) times daily. 04/28/21  Yes Toy Cookey E, NP  prazosin (MINIPRESS) 1 MG capsule Take 1 mg by mouth at bedtime.   Yes [provider]  risperiDONE (RISPERDAL) 1 MG tablet Take 1 tablet (1 mg total) by mouth 2 (two) times daily. 04/28/21  Yes Toy Cookey E, NP  traMADol (ULTRAM) 50 MG tablet Take 1 tablet (50 mg total) by mouth 2 (two) times daily as needed. Patient not taking: Reported on 11/10/2022 10/30/22   Cristie Hem, PA-C  albuterol [facility] HFA) [phone] Base) MCG/ACT inhaler Inhale 2 puffs into the lungs every 6 (six) hours as needed for wheezing or shortness of breath. Patient not taking: Reported on 11/10/2022 10/03/19   Glynn Octave, MD  meloxicam (MOBIC) 7.5 MG tablet Take 1 tablet (7.5 mg total) by mouth daily. Patient not taking: Reported on 11/10/2022 09/09/22   Elson Areas, PA-C     Positive ROS: All other systems have been reviewed and were otherwise negative with the exception of those mentioned in the HPI and as above.  Physical Exam: General: Alert, no acute distress Cardiovascular: No pedal edema Respiratory: No cyanosis, no use of accessory musculature GI: abdomen soft Skin: No lesions in the area of chief complaint Neurologic: Sensation intact distally Psychiatric: Patient is competent for consent with normal mood and affect Lymphatic: no lymphedema  MUSCULOSKELETAL: exam stable  Assessment: right hip osteoarthritis  Plan: Plan for Procedure(s): RIGHT TOTAL HIP ARTHROPLASTY ANTERIOR  APPROACH  The risks benefits and alternatives were discussed with the patient including but not limited to the risks of nonoperative treatment, versus surgical intervention including infection, bleeding, nerve injury,  blood clots, cardiopulmonary complications, morbidity, mortality, among others, and they were willing to proceed.   Glee Arvin, MD 11/23/2022 10:32 AM

## 2022-11-23 NOTE — Discharge Planning (Addendum)
Oletta Cohn, RN, BSN, [place] [phone] RNCM consulted regarding DME rollator.  RNCM sent referral to see if Pt qualifies for DME (Durable Medical Equipment) rollator.  DME  ordered through [employer].  Earna Coder of [employer] notified to deliver DME to pt room prior to D/C home.     Durable Medical Equipment  (From admission, onward)           Start     Ordered   11/23/22 1221  For home use only DME 4 wheeled rolling walker with seat  Once       Question:  Patient needs a walker to treat with the following condition  Answer:  Status post total replacement of right hip   11/23/22 1220   11/23/22 1221  For home use only DME Eelevated commode seat  Once        11/23/22 1220

## 2022-11-23 NOTE — Discharge Planning (Signed)
Rollator delivered to [facility] as pt would not wait for DME.  RNCM spoke to son and he will return either tonight or in the morning to retrieve DME.

## 2022-11-23 NOTE — Discharge Summary (Signed)
Terry Potter J. Lucretia Roers, RN, BSN, [place] [phone] Pt set up with  [facility] by surgeon office to render PTservices per Amy of [facility]. Patient made aware that [facility] will be in contact in 24-48 hours.

## 2022-11-23 NOTE — Progress Notes (Signed)
UDS positive for cocaine.  Surgery will need to be cancelled for today.  Will order RW and elevated toilet seat for patient.  My office will reschedule surgery accordingly.

## 2022-11-23 NOTE — Progress Notes (Signed)
Patient was awaiting a Rolator per Careers adviser. Patient states that she does not want to wait on the rolator and wants to sit outside and wait for her ride. Patient transferred outside via wheelchair. Patient states she will come back and pick up equipment.

## 2022-11-23 NOTE — Progress Notes (Signed)
Surgery canceled. Patient UDS positive for cocaine. Surgeon aware.

## 2022-11-23 NOTE — Discharge Planning (Signed)
RNCM also consulted regarding DME raised toilet seat which isn't covered by insurance.  RNCM suggests utilizing Medical Equipment supply store.

## 2022-12-08 ENCOUNTER — Encounter: Payer: Medicare HMO | Admitting: Physician Assistant

## 2023-02-01 ENCOUNTER — Encounter (HOSPITAL_COMMUNITY): Payer: Self-pay

## 2023-02-01 ENCOUNTER — Emergency Department (HOSPITAL_COMMUNITY): Payer: Medicare HMO

## 2023-02-01 ENCOUNTER — Emergency Department (HOSPITAL_COMMUNITY)
Admission: EM | Admit: 2023-02-01 | Discharge: 2023-02-02 | Disposition: A | Discharge status: Home / Self Care | Payer: Medicare HMO | Attending: Emergency Medicine | Admitting: Emergency Medicine

## 2023-02-01 DIAGNOSIS — R9082 White matter disease, unspecified: Secondary | ICD-10-CM | POA: Diagnosis not present

## 2023-02-01 DIAGNOSIS — F1410 Cocaine abuse, uncomplicated: Secondary | ICD-10-CM | POA: Diagnosis not present

## 2023-02-01 DIAGNOSIS — M25551 Pain in right hip: Secondary | ICD-10-CM | POA: Diagnosis present

## 2023-02-01 DIAGNOSIS — J45909 Unspecified asthma, uncomplicated: Secondary | ICD-10-CM | POA: Diagnosis not present

## 2023-02-01 DIAGNOSIS — Z7982 Long term (current) use of aspirin: Secondary | ICD-10-CM | POA: Insufficient documentation

## 2023-02-01 DIAGNOSIS — F1290 Cannabis use, unspecified, uncomplicated: Secondary | ICD-10-CM | POA: Diagnosis not present

## 2023-02-01 MED ORDER — OXYCODONE-ACETAMINOPHEN 5-325 MG PO TABS
1.0000 | ORAL_TABLET | ORAL | Status: DC | PRN
  Administered 2023-02-01: 1 via ORAL
  Filled 2023-02-01: qty 1

## 2023-02-01 NOTE — ED Triage Notes (Signed)
Patient arrives EMS for a fall that occurred around 1300. Patient reports right sided hip pain and left knee pain. Patient uses a cane to ambulate. No blood thinners.

## 2023-02-01 NOTE — ED Notes (Addendum)
Patient was assisted to the bathroom using a wheelchair. Patient is abe to ambulate a few steps but complains of pain. States at home she does not walk very far due to arthritis.

## 2023-02-02 DIAGNOSIS — M25551 Pain in right hip: Secondary | ICD-10-CM | POA: Diagnosis not present

## 2023-02-02 MED ORDER — OXYCODONE-ACETAMINOPHEN 5-325 MG PO TABS
1.0000 | ORAL_TABLET | Freq: Once | ORAL | Status: AC
  Administered 2023-02-02: 1 via ORAL
  Filled 2023-02-02: qty 1

## 2023-02-02 NOTE — ED Notes (Addendum)
Assisted the patient while ambulating in the hallway. Patient has pain in her left knee and right hip. Patient was only able to ambulate a few steps while using the walker. Patient was also assisted to the bathroom using a wheelchair.

## 2023-02-02 NOTE — ED Provider Notes (Signed)
CT right hip is negative for fracture.  Does show severe arthritis.  Patient is scheduled to have this replaced in October.  She is requesting a walker which was provided and is able to ambulate.   Glynn Octave, MD 02/02/23 [phone]

## 2023-02-02 NOTE — ED Provider Notes (Signed)
Terry Potter Provider Note  CSN: [id number] Arrival date & time: 02/01/23 1949  Chief Complaint(s) Hip Pain  HPI ALIANDRA Potter is a 67 y.o. female history of bipolar disorder presenting to the emergency department with right hip pain.  Patient reports that she fell earlier today, hit her head on the dresser, landed on her right hip.  Reports that she has had difficulty with ambulation since then.  No loss of consciousness, nausea, vomiting.  No fevers or chills.   Past Medical History Past Medical History:  Diagnosis Date   Abnormal EKG    Anxiety    Arthritis    Asthma    Bipolar disorder (HCC)    Depression    Ectopic pregnancy    Hip pain    Marijuana use    Protein-calorie malnutrition, severe (HCC)    Shoulder pain    Tobacco dependence    cocaine ,Marijuana   Patient Active Problem List   Diagnosis Date Noted   Primary osteoarthritis of right hip 08/19/2022   Grief 01/23/2021   PTSD (post-traumatic stress disorder) 08/13/2020   Cocaine use disorder, severe, in controlled environment (HCC) 07/23/2020   Cocaine abuse with cocaine-induced mood disorder (HCC) 07/23/2020   Bacterial vaginosis 02/01/2018   Cocaine abuse (HCC) 02/01/2018   Pulmonary nodules 02/01/2018   Syncope 02/01/2018   Schizoaffective disorder, bipolar type (HCC) 03/19/2015    Class: Chronic   Home Medication(s) Prior to Admission medications   Medication Sig Start Date End Date Taking? Authorizing Provider  cefadroxil (DURICEF) 500 MG capsule Take 1 capsule (500 mg total) by mouth 2 (two) times daily. To be taken after surgery 11/17/22   Cristie Hem, PA-C  docusate sodium (COLACE) 100 MG capsule Take 1 capsule (100 mg total) by mouth daily as needed. 11/17/22 11/17/23  Cristie Hem, PA-C  ondansetron (ZOFRAN) 4 MG tablet Take 1 tablet (4 mg total) by mouth every 8 (eight) hours as needed for nausea or vomiting. 11/17/22   Cristie Hem, PA-C   traMADol (ULTRAM) 50 MG tablet Take 1 tablet (50 mg total) by mouth 2 (two) times daily as needed. Patient not taking: Reported on 11/10/2022 10/30/22   Cristie Hem, PA-C  acetaminophen (TYLENOL) 500 MG tablet Take 1,000 mg by mouth every 6 (six) hours as needed for moderate pain.    [provider]  albuterol (PROAIR HFA) 108 (90 Base) MCG/ACT inhaler Inhale 2 puffs into the lungs every 6 (six) hours as needed for wheezing or shortness of breath. Patient not taking: Reported on 11/10/2022 10/03/19   Glynn Octave, MD  aspirin EC 325 MG EC tablet Take 1 tablet (325 mg total) by mouth 2 (two) times daily with a meal. Patient taking differently: Take 81 mg by mouth 2 (two) times daily with a meal. 01/03/17   [place]field, Potter doctor, PA-C  budesonide-formoterol (SYMBICORT) 160-4.5 MCG/ACT inhaler Inhale 2 puffs into the lungs daily as needed (Asthma).    [provider]  fluticasone (FLONASE) 50 MCG/ACT nasal spray Place 1 spray into both nostrils daily as needed for rhinitis.    [provider]  gabapentin (NEURONTIN) 300 MG capsule Take 1 capsule (300 mg total) by mouth 3 (three) times daily. 04/28/21   Shanna Cisco, NP  hydrOXYzine (VISTARIL) 25 MG capsule Take 25 mg by mouth 3 (three) times daily as needed (Sleep).    [provider]  loratadine (CLARITIN) 10 MG tablet Take 10 mg by mouth  daily.    [provider]  meloxicam (MOBIC) 7.5 MG tablet Take 1 tablet (7.5 mg total) by mouth daily. Patient not taking: Reported on 11/10/2022 09/09/22   Elson Areas, PA-C  mirtazapine (REMERON) 15 MG tablet Take 1 tablet (15 mg total) by mouth at bedtime. 04/28/21   Shanna Cisco, NP  Multiple Vitamins-Minerals (AIRBORNE PO) Take 1 tablet by mouth daily.    [provider]  Multiple Vitamins-Minerals (MULTIVITAMIN WITH MINERALS) tablet Take 1 tablet by mouth daily. Centrum    [provider]  naproxen (NAPROSYN) 500 MG tablet Take  500-1,000 mg by mouth 2 (two) times daily as needed for mild pain or moderate pain. 07/20/22   [provider]  Oxcarbazepine (TRILEPTAL) 300 MG tablet Take 1 tablet (300 mg total) by mouth 3 (three) times daily. 04/28/21   Shanna Cisco, NP  prazosin (MINIPRESS) 1 MG capsule Take 1 mg by mouth at bedtime.    [provider]  risperiDONE (RISPERDAL) 1 MG tablet Take 1 tablet (1 mg total) by mouth 2 (two) times daily. 04/28/21   Shanna Cisco, NP                                                                                                                                    Past Surgical History Past Surgical History:  Procedure Laterality Date   LAPAROSCOPY FOR ECTOPIC PREGNANCY     TONSILLECTOMY     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   Procedure: TOTAL HIP ARTHROPLASTY ANTERIOR APPROACH;  Surgeon: Samson Frederic, MD;  Location: [facility] OR;  Service: Orthopedics;  Laterality: Left;   TUMOR REMOVAL  1990   Uterine tumor removed   Family History Family History  Problem Relation Age of Onset   Cancer Mother     Social History Social History   Tobacco Use   Smoking status: Every Day    Current packs/day: 0.50    Average packs/day: 0.5 packs/day for 40.0 years (20.0 ttl pk-yrs)    Types: Cigarettes, Cigars   Smokeless tobacco: Never  Vaping Use   Vaping status: Never Used  Substance Use Topics   Alcohol use: Yes    Alcohol/week: 3.0 - 4.0 standard drinks of alcohol    Types: 3 - 4 Shots of liquor per week    Comment: weekly   Drug use: Not Currently    Types: Marijuana, Cocaine    Comment: not using marijuana regularly, pt reports not using in 3 weeks as of 11/02/22   Allergies Tetracyclines & related  Review of Systems Review of Systems  All other systems reviewed and are negative.   Physical Exam Vital Signs  I have reviewed the triage vital signs BP 117/66   Pulse 77   Temp 98.2 F (36.8 C) (Oral)   Resp 17   Ht 5\' 4"  (1.626 m)   Wt  63.5 kg   SpO2 98%  BMI 24.03 kg/m  Physical Exam Vitals and nursing note reviewed.  Constitutional:      General: She is not in acute distress.    Appearance: She is well-developed.  HENT:     Head: Normocephalic and atraumatic.     Mouth/Throat:     Mouth: Mucous membranes are moist.  Eyes:     Pupils: Pupils are equal, round, and reactive to light.  Cardiovascular:     Rate and Rhythm: Normal rate and regular rhythm.     Heart sounds: No murmur heard. Pulmonary:     Effort: Pulmonary effort is normal. No respiratory distress.     Breath sounds: Normal breath sounds.  Abdominal:     General: Abdomen is flat.     Palpations: Abdomen is soft.     Tenderness: There is no abdominal tenderness.  Musculoskeletal:        General: No tenderness.     Right lower leg: No edema.     Left lower leg: No edema.     Comments: Painful range of motion of the right hip with tenderness, no obvious deformity.  No tenderness to the bilateral upper extremities, CT or L-spine, left lower extremity.  Skin:    General: Skin is warm and dry.  Neurological:     General: No focal deficit present.     Mental Status: She is alert. Mental status is at baseline.  Psychiatric:        Mood and Affect: Mood normal.        Behavior: Behavior normal.     ED Results and Treatments Labs (all labs ordered are listed, but only abnormal results are displayed) Labs Reviewed - No data to display                                                                                                                        Radiology CT Head Wo Contrast  Result Date: 02/01/2023 CLINICAL DATA:  67 year old female status post fall reporting right-sided hip and left knee pain EXAM: CT HEAD WITHOUT CONTRAST CT CERVICAL SPINE WITHOUT CONTRAST TECHNIQUE: Multidetector CT imaging of the head and cervical spine was performed following the standard protocol without intravenous contrast. Multiplanar CT image reconstructions of  the cervical spine were also generated. RADIATION DOSE REDUCTION: This exam was performed according to the departmental dose-optimization program which includes automated exposure control, adjustment of the mA and/or kV according to patient size and/or use of iterative reconstruction technique. COMPARISON:  CT head 10/03/2019 FINDINGS: CT HEAD FINDINGS Brain: No intracranial hemorrhage, mass effect, or evidence of acute infarct. No hydrocephalus. No extra-axial fluid collection. Generalized cerebral atrophy. Ill-defined hypoattenuation within the cerebral white matter is nonspecific but consistent with chronic small vessel ischemic disease. Vascular: No hyperdense vessel. Intracranial arterial calcification. Skull: No fracture or focal lesion. Sinuses/Orbits: No acute finding. Other: None. CT CERVICAL SPINE FINDINGS Alignment: No evidence of traumatic malalignment. Loss of lordosis is likely chronic. Skull base and vertebrae: No acute fracture. No  primary bone lesion or focal pathologic process. Soft tissues and spinal canal: No prevertebral fluid or swelling. No visible canal hematoma. Disc levels: Multilevel spondylosis, disc space height loss, and degenerative endplate changes greatest at C3-C4, C4-C5, and C6-C7 where it is advanced. Upper chest: No acute abnormality.  Emphysema Other: Carotid calcification. IMPRESSION: 1. No acute intracranial abnormality. Generalized atrophy and small vessel white matter disease. 2. No acute fracture in the cervical spine. Multilevel degenerative spondylosis. Electronically Signed   By: Minerva Fester M.D.   On: 02/01/2023 23:17   CT Cervical Spine Wo Contrast  Result Date: 02/01/2023 CLINICAL DATA:  67 year old female status post fall reporting right-sided hip and left knee pain EXAM: CT HEAD WITHOUT CONTRAST CT CERVICAL SPINE WITHOUT CONTRAST TECHNIQUE: Multidetector CT imaging of the head and cervical spine was performed following the standard protocol without  intravenous contrast. Multiplanar CT image reconstructions of the cervical spine were also generated. RADIATION DOSE REDUCTION: This exam was performed according to the departmental dose-optimization program which includes automated exposure control, adjustment of the mA and/or kV according to patient size and/or use of iterative reconstruction technique. COMPARISON:  CT head 10/03/2019 FINDINGS: CT HEAD FINDINGS Brain: No intracranial hemorrhage, mass effect, or evidence of acute infarct. No hydrocephalus. No extra-axial fluid collection. Generalized cerebral atrophy. Ill-defined hypoattenuation within the cerebral white matter is nonspecific but consistent with chronic small vessel ischemic disease. Vascular: No hyperdense vessel. Intracranial arterial calcification. Skull: No fracture or focal lesion. Sinuses/Orbits: No acute finding. Other: None. CT CERVICAL SPINE FINDINGS Alignment: No evidence of traumatic malalignment. Loss of lordosis is likely chronic. Skull base and vertebrae: No acute fracture. No primary bone lesion or focal pathologic process. Soft tissues and spinal canal: No prevertebral fluid or swelling. No visible canal hematoma. Disc levels: Multilevel spondylosis, disc space height loss, and degenerative endplate changes greatest at C3-C4, C4-C5, and C6-C7 where it is advanced. Upper chest: No acute abnormality.  Emphysema Other: Carotid calcification. IMPRESSION: 1. No acute intracranial abnormality. Generalized atrophy and small vessel white matter disease. 2. No acute fracture in the cervical spine. Multilevel degenerative spondylosis. Electronically Signed   By: Minerva Fester M.D.   On: 02/01/2023 23:17   DG Hip Unilat  With Pelvis 2-3 Views Right  Result Date: 02/01/2023 CLINICAL DATA:  Right hip pain after fall EXAM: DG HIP (WITH OR WITHOUT PELVIS) 2-3V RIGHT COMPARISON:  09/09/2022 FINDINGS: No evidence of acute fracture or dislocation. Advanced right hip osteoarthritis with complete  joint space loss, subchondral cystic change and sclerosis. Left THA. IMPRESSION: 1.  No acute fracture or dislocation. 2. Advanced right hip osteoarthritis. Electronically Signed   By: Minerva Fester M.D.   On: 02/01/2023 21:24   DG Knee Complete 4 Views Left  Result Date: 02/01/2023 CLINICAL DATA:  Left knee pain after fall EXAM: LEFT KNEE - COMPLETE 4+ VIEW COMPARISON:  Radiographs 10/29/2022 FINDINGS: No acute fracture or dislocation. Moderate knee joint effusion. Tricompartmental degenerative arthritis with advanced medial compartment narrowing. IMPRESSION: 1.  No acute fracture or dislocation. 2. Advanced medial compartment osteoarthritis. Moderate knee joint effusion. Electronically Signed   By: Minerva Fester M.D.   On: 02/01/2023 21:22    Pertinent labs & imaging results that were available during my care of the patient were reviewed by me and considered in my medical decision making (see MDM for details).  Medications Ordered in ED Medications  oxyCODONE-acetaminophen (PERCOCET/ROXICET) 5-325 MG per tablet 1 tablet (1 tablet Oral Given 02/01/23 2000)  Procedures Procedures  (including critical care time)  Medical Decision Making / ED Course   MDM:  67 year old female presenting with fall   X-ray negative but patient has significant arthritis and has significant pain and painful range of motion.  Obtain CT scan which is pending.  CT head, CT cervical spine negative for traumatic injury.  No other signs of any traumatic injury on exam.  Signed out to oncoming physician pending CT head given ongoing pain.     Additional history obtained: -Additional history obtained from ems -External records from outside source obtained and reviewed including: Chart review including previous notes, labs, imaging, consultation notes including CXR   Imaging  Studies ordered: I ordered imaging studies including CT head, CT neck On my interpretation imaging demonstrates no acute process I independently visualized and interpreted imaging. I agree with the radiologist interpretation   Medicines ordered and prescription drug management: Meds ordered this encounter  Medications   oxyCODONE-acetaminophen (PERCOCET/ROXICET) 5-325 MG per tablet 1 tablet    -I have reviewed the patients home medicines and have made adjustments as needed  Social Determinants of Health:  Diagnosis or treatment significantly limited by social determinants of health: polysubstance abuse and lives alone   Reevaluation: After the interventions noted above, I reevaluated the patient and found that their symptoms have improved  Co morbidities that complicate the patient evaluation  Past Medical History:  Diagnosis Date   Abnormal EKG    Anxiety    Arthritis    Asthma    Bipolar disorder (HCC)    Depression    Ectopic pregnancy    Hip pain    Marijuana use    Protein-calorie malnutrition, severe (HCC)    Shoulder pain    Tobacco dependence    cocaine ,Marijuana      Dispostion: Disposition decision including need for hospitalization was considered, and patient disposition pending at time of sign out.    Final Clinical Impression(s) / ED Diagnoses Final diagnoses:  Right hip pain     This chart was dictated using voice recognition software.  Despite best efforts to proofread,  errors can occur which can change the documentation meaning.    Lonell Grandchild, MD 02/02/23 [facility]

## 2023-02-02 NOTE — ED Notes (Signed)
Patient needs a walker. Please call Terry Potter, cousin, [phone] to reach the patient.

## 2023-02-02 NOTE — ED Notes (Signed)
[facility] for pickup.

## 2023-02-02 NOTE — Discharge Instructions (Signed)
Your Xray and CT scan is negative for fracture. Followup with Dr. Roda Shutters. Return to the ED if you develop new or worsening symptoms.

## 2023-02-23 ENCOUNTER — Telehealth: Payer: Self-pay | Admitting: Orthopaedic Surgery

## 2023-02-23 NOTE — Telephone Encounter (Signed)
Pt called requesting a call from Debbie. Pt is asking for time she need to be at hospital for surgery. Please call pt at [phone].

## 2023-03-08 ENCOUNTER — Ambulatory Visit: Payer: Medicare HMO

## 2023-03-16 NOTE — Pre-Procedure Instructions (Signed)
Surgical Instructions   Your procedure is scheduled on Monday, October 14th. Report to [facility] Main Entrance "A" at 12:45 P.M., then check in with the Admitting office. Any questions or running late day of surgery: call [phone]  Questions prior to your surgery date: call [phone], Monday-Friday, 8am-4pm. If you experience any cold or flu symptoms such as cough, fever, chills, shortness of breath, etc. between now and your scheduled surgery, please notify us at the above number.     Remember:  Do not eat after midnight the night before your surgery  You may drink clear liquids until 12:15 PM the day of your surgery.   Clear liquids allowed are: Water, Non-Citrus Juices (without pulp), Carbonated Beverages, Clear Tea, Black Coffee Only (NO MILK, CREAM OR POWDERED CREAMER of any kind), and Gatorade.  Patient Instructions  The night before surgery:  No food after midnight. ONLY clear liquids after midnight  The day of surgery (if you do NOT have diabetes):  Drink ONE (1) Pre-Surgery Clear Ensure by 12:15 PM the day of surgery. Drink in one sitting. Do not sip.  This drink was given to you during your hospital  pre-op appointment visit.  Nothing else to drink after completing the  Pre-Surgery Clear Ensure.          If you have questions, please contact your surgeon's office.    Take these medicines the morning of surgery with A SIP OF WATER  acetaminophen (TYLENOL)  gabapentin (NEURONTIN)  omeprazole (PRILOSEC)  Oxcarbazepine (TRILEPTAL)  risperiDONE (RISPERDAL)   May take these medicines IF NEEDED: budesonide-formoterol (SYMBICORT)  fluticasone (FLONASE)  hydrOXYzine (ATARAX)  ondansetron (ZOFRAN)     Follow your surgeon's instructions on when to stop Asprin.  If no instructions were given by your surgeon then you will need to call the office to get those instructions.     One week prior to surgery, STOP taking any Aleve, Naproxen, Ibuprofen, Motrin, Advil,  Goody's, BC's, all herbal medications, fish oil, and non-prescription vitamins.                     Do NOT Smoke (Tobacco/Vaping) for 24 hours prior to your procedure.  If you use a CPAP at night, you may bring your mask/headgear for your overnight stay.   You will be asked to remove any contacts, glasses, piercing's, hearing aid's, dentures/partials prior to surgery. Please bring cases for these items if needed.    Patients discharged the day of surgery will not be allowed to drive home, and someone needs to stay with them for 24 hours.  SURGICAL WAITING ROOM VISITATION Patients may have no more than 2 support people in the waiting area - these visitors may rotate.   Pre-op nurse will coordinate an appropriate time for 1 ADULT support person, who may not rotate, to accompany patient in pre-op.  Children under the age of 55 must have an adult with them who is not the patient and must remain in the main waiting area with an adult.  If the patient needs to stay at the hospital during part of their recovery, the visitor guidelines for inpatient rooms apply.  Please refer to the [facility] website for the visitor guidelines for any additional information.   If you received a COVID test during your pre-op visit  it is requested that you wear a mask when out in public, stay away from anyone that may not be feeling well and notify your surgeon if you develop symptoms.  If you have been in contact with anyone that has tested positive in the last 10 days please notify you surgeon.      Pre-operative 5 CHG Bathing Instructions   You can play a key role in reducing the risk of infection after surgery. Your skin needs to be as free of germs as possible. You can reduce the number of germs on your skin by washing with CHG (chlorhexidine gluconate) soap before surgery. CHG is an antiseptic soap that kills germs and continues to kill germs even after washing.   DO NOT use if you have an allergy to  chlorhexidine/CHG or antibacterial soaps. If your skin becomes reddened or irritated, stop using the CHG and notify one of our RNs at [phone].   Please shower with the CHG soap starting 4 days before surgery using the following schedule:     Please keep in mind the following:  DO NOT shave, including legs and underarms, starting the day of your first shower.   You may shave your face at any point before/day of surgery.  Place clean sheets on your bed the day you start using CHG soap. Use a clean washcloth (not used since being washed) for each shower. DO NOT sleep with pets once you start using the CHG.   CHG Shower Instructions:  Wash your face and private area with normal soap. If you choose to wash your hair, wash first with your normal shampoo.  After you use shampoo/soap, rinse your hair and body thoroughly to remove shampoo/soap residue.  Turn the water OFF and apply about 3 tablespoons (45 ml) of CHG soap to a CLEAN washcloth.  Apply CHG soap ONLY FROM YOUR NECK DOWN TO YOUR TOES (washing for 3-5 minutes)  DO NOT use CHG soap on face, private areas, open wounds, or sores.  Pay special attention to the area where your surgery is being performed.  If you are having back surgery, having someone wash your back for you may be helpful. Wait 2 minutes after CHG soap is applied, then you may rinse off the CHG soap.  Pat dry with a clean towel  Put on clean clothes/pajamas   If you choose to wear lotion, please use ONLY the CHG-compatible lotions on the back of this paper.   Additional instructions for the day of surgery: DO NOT APPLY any lotions, deodorants, cologne, or perfumes.   Do not bring valuables to the hospital. [facility] is not responsible for any belongings/valuables. Do not wear nail polish, gel polish, artificial nails, or any other type of covering on natural nails (fingers and toes) Do not wear jewelry or makeup Put on clean/comfortable clothes.  Please brush your  teeth.  Ask your nurse before applying any prescription medications to the skin.     CHG Compatible Lotions   Aveeno Moisturizing lotion  Cetaphil Moisturizing Cream  Cetaphil Moisturizing Lotion  Clairol Herbal Essence Moisturizing Lotion, Dry Skin  Clairol Herbal Essence Moisturizing Lotion, Extra Dry Skin  Clairol Herbal Essence Moisturizing Lotion, Normal Skin  Curel Age Defying Therapeutic Moisturizing Lotion with Alpha Hydroxy  Curel Extreme Care Body Lotion  Curel Soothing Hands Moisturizing Hand Lotion  Curel Therapeutic Moisturizing Cream, Fragrance-Free  Curel Therapeutic Moisturizing Lotion, Fragrance-Free  Curel Therapeutic Moisturizing Lotion, Original Formula  Eucerin Daily Replenishing Lotion  Eucerin Dry Skin Therapy Plus Alpha Hydroxy Crme  Eucerin Dry Skin Therapy Plus Alpha Hydroxy Lotion  Eucerin Original Crme  Eucerin Original Lotion  Eucerin Plus Crme Eucerin Plus Lotion  Eucerin TriLipid Replenishing Lotion  Keri Anti-Bacterial Hand Lotion  Keri Deep Conditioning Original Lotion Dry Skin Formula Softly Scented  Keri Deep Conditioning Original Lotion, Fragrance Free Sensitive Skin Formula  Keri Lotion Fast Absorbing Fragrance Free Sensitive Skin Formula  Keri Lotion Fast Absorbing Softly Scented Dry Skin Formula  Keri Original Lotion  Keri Skin Renewal Lotion Keri Silky Smooth Lotion  Keri Silky Smooth Sensitive Skin Lotion  Nivea Body Creamy Conditioning Oil  Nivea Body Extra Enriched Lotion  Nivea Body Original Lotion  Nivea Body Sheer Moisturizing Lotion Nivea Crme  Nivea Skin Firming Lotion  NutraDerm 30 Skin Lotion  NutraDerm Skin Lotion  NutraDerm Therapeutic Skin Cream  NutraDerm Therapeutic Skin Lotion  ProShield Protective Hand Cream  Provon moisturizing lotion  Please read over the following fact sheets that you were given.

## 2023-03-17 ENCOUNTER — Encounter (HOSPITAL_COMMUNITY)
Admission: RE | Admit: 2023-03-17 | Discharge: 2023-03-17 | Disposition: A | Discharge status: Home / Self Care | Payer: Medicare HMO | Source: Ambulatory Visit | Attending: Orthopaedic Surgery | Admitting: Orthopaedic Surgery

## 2023-03-17 ENCOUNTER — Encounter (HOSPITAL_COMMUNITY): Payer: Self-pay

## 2023-03-17 ENCOUNTER — Other Ambulatory Visit: Payer: Self-pay

## 2023-03-17 VITALS — BP 129/59 | HR 74 | Temp 98.5°F | Resp 18 | Ht 64.0 in | Wt 154.2 lb

## 2023-03-17 DIAGNOSIS — Z01818 Encounter for other preprocedural examination: Secondary | ICD-10-CM

## 2023-03-17 DIAGNOSIS — M1611 Unilateral primary osteoarthritis, right hip: Secondary | ICD-10-CM | POA: Diagnosis not present

## 2023-03-17 DIAGNOSIS — Z01812 Encounter for preprocedural laboratory examination: Secondary | ICD-10-CM | POA: Insufficient documentation

## 2023-03-17 HISTORY — DX: Gastro-esophageal reflux disease without esophagitis: K21.9

## 2023-03-17 HISTORY — DX: Unilateral primary osteoarthritis, right hip: M16.11

## 2023-03-17 LAB — CBC
HCT: 36.5 % (ref 36.0–46.0)
Hemoglobin: 12.5 g/dL (ref 12.0–15.0)
MCH: 32.6 pg (ref 26.0–34.0)
MCHC: 34.2 g/dL (ref 30.0–36.0)
MCV: 95.3 fL (ref 80.0–100.0)
Platelets: 223 10*3/uL (ref 150–400)
RBC: 3.83 MIL/uL — ABNORMAL LOW (ref 3.87–5.11)
RDW: 14.9 % (ref 11.5–15.5)
WBC: 4.3 10*3/uL (ref 4.0–10.5)
nRBC: 0 % (ref 0.0–0.2)

## 2023-03-17 LAB — BASIC METABOLIC PANEL
Anion gap: 7 (ref 5–15)
BUN: 17 mg/dL (ref 8–23)
CO2: 22 mmol/L (ref 22–32)
Calcium: 8.7 mg/dL — ABNORMAL LOW (ref 8.9–10.3)
Chloride: 100 mmol/L (ref 98–111)
Creatinine, Ser: 0.89 mg/dL (ref 0.44–1.00)
GFR, Estimated: >60 mL/min (ref >60)
Glucose, Bld: 91 mg/dL (ref 70–99)
Potassium: 5 mmol/L (ref 3.5–5.1)
Sodium: 129 mmol/L — ABNORMAL LOW (ref 135–145)

## 2023-03-17 LAB — TYPE AND SCREEN
ABO/RH(D): A POS
Antibody Screen: NEGATIVE

## 2023-03-17 LAB — SURGICAL PCR SCREEN
MRSA, PCR: NEGATIVE
Staphylococcus aureus: NEGATIVE

## 2023-03-17 NOTE — Progress Notes (Signed)
PCP - Dr. Loura Back Cardiologist - denies  PPM/ICD - denies   Chest x-ray - 12/05/20 EKG - 09/21/22 Stress Test - 05/28/22 ECHO - 05/28/22 Cardiac Cath - denies  Sleep Study - denies   DM- denies  Blood Thinner Instructions: n/a Aspirin Instructions: f/u with surgeon  ERAS Protcol - yes PRE-SURGERY Ensure given at PAT  COVID TEST- n/a   Anesthesia review: no  Patient denies shortness of breath, fever, cough and chest pain at PAT appointment   All instructions explained to the patient, with a verbal understanding of the material. Patient agrees to go over the instructions while at home for a better understanding.  The opportunity to ask questions was provided.

## 2023-03-18 LAB — RAPID URINE DRUG SCREEN, HOSP PERFORMED
Amphetamines: NOT DETECTED
Barbiturates: NOT DETECTED
Benzodiazepines: NOT DETECTED
Cocaine: POSITIVE — AB
Opiates: NOT DETECTED
Tetrahydrocannabinol: NOT DETECTED

## 2023-03-18 NOTE — Progress Notes (Signed)
Rapid UDS from 10/2 was cancelled. Lab was called and asked why. They were unable to give a reason and did not know why the order was cancelled at 0501 this morning. Order was placed yesterday and urine was sent down yesterday afternoon. UDS was re-ordered, as instructed by Felipa Eth in the lab.

## 2023-03-19 ENCOUNTER — Encounter (HOSPITAL_COMMUNITY): Payer: Self-pay | Admitting: Physician Assistant

## 2023-03-19 NOTE — Progress Notes (Signed)
Terry Potter, with Dr. Warren Danes office, notified of Rapid drug screen results.

## 2023-03-24 NOTE — Progress Notes (Signed)
Anesthesia Chart Review:  67 year old female with pertinent history of schizoaffective disorder, bipolar disorder, PTSD, substance abuse, asthma, current smoker with associated emphysema.  She had prior preop evaluation by cardiologist Dr. Anne Fu in November 2023 and subsequent cardiac clearance in December 2023.  Per note 06/01/2022, "Chart reviewed as part of pre-operative protocol coverage. Patient was recently seen by Dr. Anne Fu on 04/28/2022 for pre-op evaluation for upcoming hip surgery given an abnormal EKG. She denied any specific cardiac symptoms at that time. Given abnormal EKG and coronary artery calcifications noted on prior CT, Lexiscan Myoview and Echo were ordered for further evaluation.  Myoview was low risk and showed a fixed apical inferior perfusion defect consistent with artifact but no ischemia. Echo showed LVEF of 50-55% with mild akinesis of the lateral apex (1 segment and normal diastolic parameters. Per Dr. Anne Fu, patient is okay to proceed with hip surgery."  This surgery recently has previously been canceled 3 times due to UDS being positive for cocaine.  Patient was again positive for cocaine at preop testing on 03/17/2023.  Dr. Roda Shutters notified.  He advised he will proceed with surgery as planned and retest patient on day of surgery.  Preop labs reviewed, moderate hyponatremia sodium 129, otherwise unremarkable.  Repeat i-STAT on day of surgery.  EKG 09/21/2022: NSR.  Rate 78.  Possible anterior infarct, age-indeterminate.  Myocardial Perfusion 05/28/2022   The study is normal. The study is low risk.   No ST deviation was noted.   LV perfusion is normal. There is no evidence of ischemia. There is no evidence of infarction.   Left ventricular function is normal. Nuclear stress EF: 69 %. The left ventricular ejection fraction is hyperdynamic (>65%). End diastolic cavity size is normal. End systolic cavity size is normal.   Prior study not available for comparison.   Fixed  apical inferior perfusion defect with normal wall motion, consistent with artifact Low risk study   Echo 05/28/2022 1. Left ventricular ejection fraction, by estimation, is 50 to 55%. Left  ventricular ejection fraction by 3D volume is 53 %. The left ventricle has  low normal function. The left ventricle demonstrates regional wall motion  abnormalities - mild  hypokinesis of the lateral apex (1 segment). Left ventricular diastolic  parameters were normal. The average left ventricular global longitudinal  strain is -20.4 %. The global longitudinal strain is normal.   2. Normal right ventricular free wall strain, -27%. Right ventricular  systolic function is normal. The right ventricular size is normal.  Tricuspid regurgitation signal is inadequate for assessing PA pressure.   3. The mitral valve is normal in structure. Trivial mitral valve  regurgitation. No evidence of mitral stenosis.   4. The aortic valve is tricuspid. There is mild thickening of the aortic  valve. Aortic valve regurgitation is not visualized. No aortic stenosis is  present.   5. The inferior vena cava is normal in size with <50% respiratory  variability, suggesting right atrial pressure of 8 mmHg.    Zannie Cove [facility] Stay [facility]/Anesthesiology Phone [phone] 03/24/2023 1:38 PM

## 2023-03-24 NOTE — Anesthesia Preprocedure Evaluation (Addendum)
Anesthesia Evaluation    Airway        Dental   Pulmonary asthma , Current Smoker          Cardiovascular   Myocardial Perfusion 05/28/2022   The study is normal. The study is low risk.   No ST deviation was noted.   LV perfusion is normal. There is no evidence of ischemia. There is no evidence of infarction.   Left ventricular function is normal. Nuclear stress EF: 69 %. The left ventricular ejection fraction is hyperdynamic (>65%). End diastolic cavity size is normal. End systolic cavity size is normal.   Prior study not available for comparison.   1. Fixed apical inferior perfusion defect with normal wall motion, consistent with artifact 2. Low risk study    Neuro/Psych  PSYCHIATRIC DISORDERS Anxiety Depression Bipolar Disorder Schizophrenia  PTSD   GI/Hepatic ,,,(+)     substance abuse  cocaine use  Endo/Other    Renal/GU Lab Results      Component                Value               Date                      NA                       129 (L)             03/17/2023                       K                        5.0                 03/17/2023                  CREATININE               0.89                03/17/2023                          Musculoskeletal  (+) Arthritis ,    Abdominal   Peds  Hematology Lab Results      Component                Value               Date                       HGB                      12.5                03/17/2023                HCT                      36.5                03/17/2023                 PLT  223                 03/17/2023              Anesthesia Other Findings All: tetracyclines  Reproductive/Obstetrics                              Anesthesia Physical Anesthesia Plan  ASA: 3  Anesthesia Plan: Spinal   Post-op Pain Management:    Induction: Intravenous  PONV Risk Score and Plan: Treatment may vary due to age  or medical condition, Midazolam and Propofol infusion  Airway Management Planned: Natural Airway and Nasal Cannula  Additional Equipment:   Intra-op Plan:   Post-operative Plan:   Informed Consent:      Dental advisory given  Plan Discussed with:   Anesthesia Plan Comments: (Check tox screen  PAT note by Antionette Poles, PA-C: 67 year old female with pertinent history of schizoaffective disorder, bipolar disorder, PTSD, substance abuse, asthma, current smoker with associated emphysema.  She had prior preop evaluation by cardiologist Dr. Anne Fu in November 2023 and subsequent cardiac clearance in December 2023.  Per note 06/01/2022, "Chart reviewed as part of pre-operative protocol coverage. Patient was recently seen by Dr. Anne Fu on 04/28/2022 for pre-op evaluation for upcoming hip surgery given an abnormal EKG. She denied any specific cardiac symptoms at that time. Given abnormal EKG and coronary artery calcifications noted on prior CT, Lexiscan Myoview and Echo were ordered for further evaluation.  Myoview was low risk and showed a fixed apical inferior perfusion defect consistent with artifact but no ischemia. Echo showed LVEF of 50-55% with mild akinesis of the lateral apex (1 segment and normal diastolic parameters. Per Dr. Anne Fu, patient is okay to proceed with hip surgery."  This surgery recently has previously been canceled 3 times due to UDS being positive for cocaine.  Patient was again positive for cocaine at preop testing on 03/17/2023.  Dr. Roda Shutters notified.  He advised he will proceed with surgery as planned and retest patient on day of surgery.  Preop labs reviewed, moderate hyponatremia sodium 129, otherwise unremarkable.  Repeat i-STAT on day of surgery.  EKG 09/21/2022: NSR.  Rate 78.  Possible anterior infarct, age-indeterminate.  Myocardial Perfusion 05/28/2022   The study is normal. The study is low risk.   No ST deviation was noted.   LV perfusion is normal. There is  no evidence of ischemia. There is no evidence of infarction.   Left ventricular function is normal. Nuclear stress EF: 69 %. The left ventricular ejection fraction is hyperdynamic (>65%). End diastolic cavity size is normal. End systolic cavity size is normal.   Prior study not available for comparison.   1. Fixed apical inferior perfusion defect with normal wall motion, consistent with artifact 2. Low risk study   Echo 05/28/2022 1. Left ventricular ejection fraction, by estimation, is 50 to 55%. Left  ventricular ejection fraction by 3D volume is 53 %. The left ventricle has  low normal function. The left ventricle demonstrates regional wall motion  abnormalities - mild  hypokinesis of the lateral apex (1 segment). Left ventricular diastolic  parameters were normal. The average left ventricular global longitudinal  strain is -20.4 %. The global longitudinal strain is normal.   2. Normal right ventricular free wall strain, -27%. Right ventricular  systolic function is normal. The right ventricular size is normal.  Tricuspid regurgitation signal is inadequate for assessing PA pressure.   3. The  mitral valve is normal in structure. Trivial mitral valve  regurgitation. No evidence of mitral stenosis.   4. The aortic valve is tricuspid. There is mild thickening of the aortic  valve. Aortic valve regurgitation is not visualized. No aortic stenosis is  present.   5. The inferior vena cava is normal in size with <50% respiratory  variability, suggesting right atrial pressure of 8 mmHg.    )         Anesthesia Quick Evaluation

## 2023-03-29 ENCOUNTER — Ambulatory Visit (HOSPITAL_COMMUNITY): Payer: Medicare HMO

## 2023-03-29 ENCOUNTER — Encounter (HOSPITAL_COMMUNITY)
Admission: RE | Disposition: A | Discharge status: Home / Self Care | Payer: Self-pay | Source: Home / Self Care | Attending: Orthopaedic Surgery

## 2023-03-29 ENCOUNTER — Encounter (HOSPITAL_COMMUNITY): Payer: Self-pay | Admitting: Orthopaedic Surgery

## 2023-03-29 ENCOUNTER — Ambulatory Visit (HOSPITAL_COMMUNITY)
Admission: RE | Admit: 2023-03-29 | Discharge: 2023-03-29 | Disposition: A | Discharge status: Home / Self Care | Payer: Medicare HMO | Attending: Orthopaedic Surgery | Admitting: Orthopaedic Surgery

## 2023-03-29 DIAGNOSIS — Z01818 Encounter for other preprocedural examination: Secondary | ICD-10-CM

## 2023-03-29 DIAGNOSIS — F1414 Cocaine abuse with cocaine-induced mood disorder: Secondary | ICD-10-CM | POA: Diagnosis not present

## 2023-03-29 DIAGNOSIS — M1611 Unilateral primary osteoarthritis, right hip: Secondary | ICD-10-CM | POA: Diagnosis present

## 2023-03-29 DIAGNOSIS — Z538 Procedure and treatment not carried out for other reasons: Secondary | ICD-10-CM | POA: Insufficient documentation

## 2023-03-29 DIAGNOSIS — F17210 Nicotine dependence, cigarettes, uncomplicated: Secondary | ICD-10-CM | POA: Insufficient documentation

## 2023-03-29 LAB — RAPID URINE DRUG SCREEN, HOSP PERFORMED
Amphetamines: NOT DETECTED
Barbiturates: NOT DETECTED
Benzodiazepines: NOT DETECTED
Cocaine: POSITIVE — AB
Opiates: NOT DETECTED
Tetrahydrocannabinol: NOT DETECTED

## 2023-03-29 SURGERY — ARTHROPLASTY, HIP, TOTAL, ANTERIOR APPROACH
Anesthesia: Spinal | Site: Hip | Laterality: Right

## 2023-03-29 MED ORDER — ORAL CARE MOUTH RINSE: 15.0000 mL | Freq: Once | OROMUCOSAL | Status: DC

## 2023-03-29 MED ORDER — TRANEXAMIC ACID-NACL 1000-0.7 MG/100ML-% IV SOLN: 1000.0000 mg | INTRAVENOUS | Status: DC

## 2023-03-29 MED ORDER — LACTATED RINGERS IV SOLN: INTRAVENOUS | Status: DC

## 2023-03-29 MED ORDER — POVIDONE-IODINE 10 % EX SWAB: 2.0000 | Freq: Once | CUTANEOUS | Status: DC

## 2023-03-29 MED ORDER — CHLORHEXIDINE GLUCONATE 0.12 % MT SOLN: 15.0000 mL | Freq: Once | OROMUCOSAL | Status: DC

## 2023-03-29 MED ORDER — TRANEXAMIC ACID 1000 MG/10ML IV SOLN
2000.0000 mg | INTRAVENOUS | Status: DC
  Filled 2023-03-29: qty 20

## 2023-03-29 MED ORDER — CEFAZOLIN SODIUM-DEXTROSE 2-4 GM/100ML-% IV SOLN: 2.0000 g | INTRAVENOUS | Status: DC

## 2023-03-29 NOTE — Progress Notes (Signed)
UDS positive again for cocaine.  Surgery cancelled.

## 2023-03-29 NOTE — Progress Notes (Signed)
Walker brought up to pt. Transport arranged with Patent attorney via [employer]. Pt discharged to [facility] vehicle.

## 2023-03-29 NOTE — Progress Notes (Signed)
Surgery Cancelled per Dr. Roda Shutters. Pt requesting walker due to difficulty getting around with cane. Order placed per Dr. Roda Shutters.

## 2023-03-29 NOTE — Discharge Planning (Signed)
RNCM consulted regarding pt needing DME walker.  RNCM reviewed chart to find that pt was to receive DME rollator on 6/10 hospital visit but did not stay to get it.  Rollator was delivered to [facility] and kept in office as pt son was to retreive rollator next day but never showed up.  RNCM gave rollator to pt prior to discharge home today.

## 2023-03-29 NOTE — H&P (Signed)
PREOPERATIVE H&P  Chief Complaint: right hip osteoarthritis  HPI: Terry Potter is a 67 y.o. female who presents for surgical treatment of right hip osteoarthritis.  She denies any changes in medical history.  Past Surgical History:  Procedure Laterality Date   LAPAROSCOPY FOR ECTOPIC PREGNANCY     TONSILLECTOMY     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   Procedure: TOTAL HIP ARTHROPLASTY ANTERIOR APPROACH;  Surgeon: Samson Frederic, MD;  Location: [facility] OR;  Service: Orthopedics;  Laterality: Left;   TUMOR REMOVAL  1990   Uterine tumor removed   Social History   Socioeconomic History   Marital status: Divorced    Spouse name: Not on file   Number of children: 1   Years of education: Not on file   Highest education level: Not on file  Occupational History   Not on file  Tobacco Use   Smoking status: Every Day    Current packs/day: 0.50    Average packs/day: 0.5 packs/day for 40.0 years (20.0 ttl pk-yrs)    Types: Cigars, Cigarettes   Smokeless tobacco: Never   Tobacco comments:    8 cigars a day (quit smoking cigarettes in 1998)  Vaping Use   Vaping status: Never Used  Substance and Sexual Activity   Alcohol use: Yes    Alcohol/week: 3.0 - 4.0 standard drinks of alcohol    Types: 3 - 4 Shots of liquor per week    Comment: weekly   Drug use: Not Currently    Types: Marijuana, Cocaine    Comment: has not smoked marijuana "in a long time" and has not used cocaine in "over 2 weeks" 03/17/23   Sexual activity: Not on file  Other Topics Concern   Not on file  Social History Narrative   Not on file   Social Determinants of Health   Financial Resource Strain: Not on file  Food Insecurity: Not on file  Transportation Needs: Not on file  Physical Activity: Not on file  Stress: Not on file  Social Connections: Not on file   Family History  Problem Relation Age of Onset   Cancer Mother    Allergies  Allergen Reactions   Tetracyclines & Related Hives    Prior to Admission medications   Medication Sig Start Date End Date Taking? Authorizing Provider  acetaminophen (TYLENOL) 500 MG tablet Take 1,000 mg by mouth in the morning, at noon, and at bedtime.   Yes [provider]  aspirin EC 325 MG EC tablet Take 1 tablet (325 mg total) by mouth 2 (two) times daily with a meal. Patient taking differently: Take 650 mg by mouth 2 (two) times daily with a meal. 01/03/17  Yes [place], Hospital doctor, PA-C  budesonide-formoterol (SYMBICORT) 160-4.5 MCG/ACT inhaler Inhale 2 puffs into the lungs 2 (two) times daily as needed (respiratory issues).   Yes [provider]  calcium carbonate (OSCAL) 1500 (600 Ca) MG TABS tablet Take 600 mg of elemental calcium by mouth 2 (two) times daily with a meal.   Yes [provider]  docusate sodium (COLACE) 100 MG capsule Take 1 capsule (100 mg total) by mouth daily as needed. 11/17/22 11/17/23  Cristie Hem, PA-C  fluticasone (FLONASE) 50 MCG/ACT nasal spray Place 1 spray into both nostrils daily as needed for rhinitis.   Yes [provider]  gabapentin (NEURONTIN) 300 MG capsule Take 1 capsule (300 mg total) by mouth 3 (three) times daily. 04/28/21  Yes Shanna Cisco, NP  hydrOXYzine (  ATARAX) 25 MG tablet Take 25 mg by mouth 2 (two) times daily as needed (sleep/anxiety). 01/19/23  Yes [provider]  Menthol, Topical Analgesic, (ICY HOT EX) Apply 1 Application topically 3 (three) times daily as needed (pain.).   Yes [provider]  mirtazapine (REMERON) 15 MG tablet Take 1 tablet (15 mg total) by mouth at bedtime. 04/28/21  Yes Toy Cookey E, NP  naproxen (NAPROSYN) 500 MG tablet Take 500 mg by mouth in the morning and at bedtime. 07/20/22  Yes [provider]  omeprazole (PRILOSEC) 20 MG capsule Take 20 mg by mouth daily before breakfast.   Yes [provider]  ondansetron (ZOFRAN) 4 MG tablet Take 1 tablet (4 mg total) by mouth every 8 (eight)  hours as needed for nausea or vomiting. 11/17/22  Yes Cristie Hem, PA-C  Oxcarbazepine (TRILEPTAL) 300 MG tablet Take 1 tablet (300 mg total) by mouth 3 (three) times daily. 04/28/21  Yes Toy Cookey E, NP  prazosin (MINIPRESS) 1 MG capsule Take 1 mg by mouth at bedtime.   Yes [provider]  risperiDONE (RISPERDAL) 1 MG tablet Take 1 tablet (1 mg total) by mouth 2 (two) times daily. 04/28/21  Yes Toy Cookey E, NP     Positive ROS: All other systems have been reviewed and were otherwise negative with the exception of those mentioned in the HPI and as above.  Physical Exam: General: Alert, no acute distress Cardiovascular: No pedal edema Respiratory: No cyanosis, no use of accessory musculature GI: abdomen soft Skin: No lesions in the area of chief complaint Neurologic: Sensation intact distally Psychiatric: Patient is competent for consent with normal mood and affect Lymphatic: no lymphedema  MUSCULOSKELETAL: exam stable  Assessment: right hip osteoarthritis  Plan: Plan for Procedure(s): RIGHT TOTAL HIP ARTHROPLASTY ANTERIOR APPROACH  The risks benefits and alternatives were discussed with the patient including but not limited to the risks of nonoperative treatment, versus surgical intervention including infection, bleeding, nerve injury,  blood clots, cardiopulmonary complications, morbidity, mortality, among others, and they were willing to proceed.   Glee Arvin, MD 03/29/2023 1:01 PM

## 2023-03-29 NOTE — Progress Notes (Signed)
Call made to case manager for walker request. Pt getting dressed to be discharged after.

## 2023-04-13 ENCOUNTER — Encounter: Payer: Medicare HMO | Admitting: Orthopaedic Surgery

## 2023-06-22 ENCOUNTER — Other Ambulatory Visit: Payer: Self-pay

## 2023-06-22 ENCOUNTER — Encounter (HOSPITAL_COMMUNITY): Payer: Self-pay | Admitting: Emergency Medicine

## 2023-06-22 ENCOUNTER — Emergency Department (HOSPITAL_COMMUNITY): Payer: 59

## 2023-06-22 ENCOUNTER — Emergency Department (HOSPITAL_COMMUNITY)
Admission: EM | Admit: 2023-06-22 | Discharge: 2023-06-22 | Disposition: A | Discharge status: Home / Self Care | Payer: 59 | Attending: Emergency Medicine | Admitting: Emergency Medicine

## 2023-06-22 DIAGNOSIS — S0990XA Unspecified injury of head, initial encounter: Secondary | ICD-10-CM | POA: Diagnosis present

## 2023-06-22 DIAGNOSIS — S7001XA Contusion of right hip, initial encounter: Secondary | ICD-10-CM | POA: Insufficient documentation

## 2023-06-22 DIAGNOSIS — Y9301 Activity, walking, marching and hiking: Secondary | ICD-10-CM | POA: Diagnosis not present

## 2023-06-22 DIAGNOSIS — Z7982 Long term (current) use of aspirin: Secondary | ICD-10-CM | POA: Insufficient documentation

## 2023-06-22 DIAGNOSIS — W010XXA Fall on same level from slipping, tripping and stumbling without subsequent striking against object, initial encounter: Secondary | ICD-10-CM | POA: Insufficient documentation

## 2023-06-22 DIAGNOSIS — S0083XA Contusion of other part of head, initial encounter: Secondary | ICD-10-CM | POA: Insufficient documentation

## 2023-06-22 DIAGNOSIS — S0093XA Contusion of unspecified part of head, initial encounter: Secondary | ICD-10-CM

## 2023-06-22 DIAGNOSIS — W19XXXA Unspecified fall, initial encounter: Secondary | ICD-10-CM

## 2023-06-22 LAB — CBC
HCT: 37.3 % (ref 36.0–46.0)
Hemoglobin: 12.4 g/dL (ref 12.0–15.0)
MCH: 32.1 pg (ref 26.0–34.0)
MCHC: 33.2 g/dL (ref 30.0–36.0)
MCV: 96.6 fL (ref 80.0–100.0)
Platelets: 190 10*3/uL (ref 150–400)
RBC: 3.86 MIL/uL — ABNORMAL LOW (ref 3.87–5.11)
RDW: 14.6 % (ref 11.5–15.5)
WBC: 6.1 10*3/uL (ref 4.0–10.5)
nRBC: 0 % (ref 0.0–0.2)

## 2023-06-22 LAB — BASIC METABOLIC PANEL
Anion gap: 8 (ref 5–15)
BUN: 13 mg/dL (ref 8–23)
CO2: 23 mmol/L (ref 22–32)
Calcium: 8.8 mg/dL — ABNORMAL LOW (ref 8.9–10.3)
Chloride: 109 mmol/L (ref 98–111)
Creatinine, Ser: 0.83 mg/dL (ref 0.44–1.00)
GFR, Estimated: >60 mL/min (ref >60)
Glucose, Bld: 108 mg/dL — ABNORMAL HIGH (ref 70–99)
Potassium: 3.7 mmol/L (ref 3.5–5.1)
Sodium: 140 mmol/L (ref 135–145)

## 2023-06-22 MED ORDER — OXYCODONE HCL 5 MG PO TABS
5.0000 mg | ORAL_TABLET | Freq: Once | ORAL | Status: AC
  Administered 2023-06-22: 5 mg via ORAL
  Filled 2023-06-22: qty 1

## 2023-06-22 MED ORDER — HYDROCODONE-ACETAMINOPHEN 5-325 MG PO TABS: 1.0000 | ORAL_TABLET | ORAL | 0 refills | Status: AC | PRN

## 2023-06-22 MED ORDER — HYDROCODONE-ACETAMINOPHEN 5-325 MG PO TABS
2.0000 | ORAL_TABLET | Freq: Once | ORAL | Status: AC
  Administered 2023-06-22: 2 via ORAL
  Filled 2023-06-22: qty 2

## 2023-06-22 NOTE — ED Notes (Signed)
 Pt ambulated in the room with walker. Per pt ambulation felt like her baseline and she feels ready to go home.

## 2023-06-22 NOTE — ED Triage Notes (Signed)
 Report from [facility].  Pt from home.  Pt fell over her walker that folded up on her while walking.  Pt was able to get up on her own and was sitting on couch on EMS arrival.  Reports that she is usually dizzy every time she walks and stays in the bed most of the time.  She was attempting to take out her garbage.  C/o pain all over- bilateral hips, knees, back, chest, neck, arms.  R sided is worse than L.  No blood thinners.  Takes 1 daily ASA.  Didn't hit head.  Denies LOC.  EMS vitals- 120/80 80 96 RA

## 2023-06-22 NOTE — ED Provider Notes (Signed)
 [place] EMERGENCY DEPARTMENT AT [facility] Provider Note   CSN: [id number] Arrival date & time: 06/22/23  1303     History  Chief Complaint  Patient presents with   Terry Potter    ELYSABETH Potter is a 68 y.o. female.  Patient reports that she tripped while walking with her walker and fell.  Patient reports she hit her head.  Patient complains of pain in her head her chest and her right hip.  Patient reports she has arthritis in her hip and was supposed to have surgery but her insurance would not approve.  Patient states that she needs to see a different orthopedist.  Patient is unsure if she lost consciousness.  Patient reports that she did not pass out.  She has not had any chest pain she has not had any headache today.  Patient reports that she walks very little because of the arthritis in her hip   Fall       Home Medications Prior to Admission medications   Medication Sig Start Date End Date Taking? Authorizing Provider  docusate sodium  (COLACE) 100 MG capsule Take 1 capsule (100 mg total) by mouth daily as needed. 11/17/22 11/17/23  Jule Ronal CROME, PA-C  HYDROcodone -acetaminophen  (NORCO/VICODIN) 5-325 MG tablet Take 1 tablet by mouth every 4 (four) hours as needed for moderate pain (pain score 4-6). 06/22/23 06/21/24 Yes Majesty Stehlin K, PA-C  acetaminophen  (TYLENOL ) 500 MG tablet Take 1,000 mg by mouth in the morning, at noon, and at bedtime.    [provider]  aspirin  EC 325 MG EC tablet Take 1 tablet (325 mg total) by mouth 2 (two) times daily with a meal. Patient taking differently: Take 650 mg by mouth 2 (two) times daily with a meal. 01/03/17   [place], Hospital Doctor, PA-C  budesonide-formoterol  (SYMBICORT) 160-4.5 MCG/ACT inhaler Inhale 2 puffs into the lungs 2 (two) times daily as needed (respiratory issues).    [provider]  calcium carbonate (OSCAL) 1500 (600 Ca) MG TABS tablet Take 600 mg of elemental calcium by mouth 2 (two) times daily with a  meal.    [provider]  fluticasone  (FLONASE ) 50 MCG/ACT nasal spray Place 1 spray into both nostrils daily as needed for rhinitis.    [provider]  gabapentin  (NEURONTIN ) 300 MG capsule Take 1 capsule (300 mg total) by mouth 3 (three) times daily. 04/28/21   Harl Zane BRAVO, NP  hydrOXYzine  (ATARAX ) 25 MG tablet Take 25 mg by mouth 2 (two) times daily as needed (sleep/anxiety). 01/19/23   [provider]  Menthol , Topical Analgesic, (ICY HOT EX) Apply 1 Application topically 3 (three) times daily as needed (pain.).    [provider]  mirtazapine  (REMERON ) 15 MG tablet Take 1 tablet (15 mg total) by mouth at bedtime. 04/28/21   Harl Zane BRAVO, NP  naproxen  (NAPROSYN ) 500 MG tablet Take 500 mg by mouth in the morning and at bedtime. 07/20/22   [provider]  omeprazole (PRILOSEC) 20 MG capsule Take 20 mg by mouth daily before breakfast.    [provider]  ondansetron  (ZOFRAN ) 4 MG tablet Take 1 tablet (4 mg total) by mouth every 8 (eight) hours as needed for nausea or vomiting. 11/17/22   Jule Ronal CROME, PA-C  Oxcarbazepine  (TRILEPTAL ) 300 MG tablet Take 1 tablet (300 mg total) by mouth 3 (three) times daily. 04/28/21   Harl Zane BRAVO, NP  prazosin  (MINIPRESS ) 1 MG capsule Take 1 mg by mouth at bedtime.  [provider]  risperiDONE  (RISPERDAL ) 1 MG tablet Take 1 tablet (1 mg total) by mouth 2 (two) times daily. 04/28/21   Harl Zane BRAVO, NP      Allergies    Tetracyclines & related    Review of Systems   Review of Systems  All other systems reviewed and are negative.   Physical Exam Updated Vital Signs BP 121/80   Pulse 71   Temp 98.8 F (37.1 C) (Oral)   Resp 18   SpO2 99%  Physical Exam Vitals and nursing note reviewed.  Constitutional:      Appearance: She is well-developed.  HENT:     Head: Normocephalic and atraumatic.     Comments: Tender full scalp and forehead, tender cervical spine  diffusely    Nose: Nose normal.     Mouth/Throat:     Mouth: Mucous membranes are moist.  Cardiovascular:     Rate and Rhythm: Normal rate and regular rhythm.  Pulmonary:     Effort: Pulmonary effort is normal.  Abdominal:     General: Abdomen is flat. There is no distension.  Musculoskeletal:        General: Tenderness present.     Cervical back: Normal range of motion.     Comments: Tender right hip pain with range of motion  Skin:    General: Skin is warm.  Neurological:     General: No focal deficit present.     Mental Status: She is alert and oriented to person, place, and time.     ED Results / Procedures / Treatments   Labs (all labs ordered are listed, but only abnormal results are displayed) Labs Reviewed  BASIC METABOLIC PANEL - Abnormal; Notable for the following components:      Result Value   Glucose, Bld 108 (*)    Calcium 8.8 (*)    All other components within normal limits  CBC - Abnormal; Notable for the following components:   RBC 3.86 (*)    All other components within normal limits  URINALYSIS, ROUTINE W REFLEX MICROSCOPIC  CBG MONITORING, ED    EKG EKG Interpretation Date/Time:  Tuesday June 22 2023 13:25:29 EST Ventricular Rate:  72 PR Interval:  164 QRS Duration:  74 QT Interval:  372 QTC Calculation: 407 R Axis:   -3  Text Interpretation: Normal sinus rhythm Anterior infarct , age undetermined  no acute ST/T changes Confirmed by Freddi Hamilton [phone]) on 06/22/2023 8:18:44 PM  Radiology CT Head Wo Contrast Result Date: 06/22/2023 CLINICAL DATA:  Trauma, fall pain EXAM: CT HEAD WITHOUT CONTRAST CT CERVICAL SPINE WITHOUT CONTRAST TECHNIQUE: Multidetector CT imaging of the head and cervical spine was performed following the standard protocol without intravenous contrast. Multiplanar CT image reconstructions of the cervical spine were also generated. RADIATION DOSE REDUCTION: This exam was performed according to the departmental  dose-optimization program which includes automated exposure control, adjustment of the mA and/or kV according to patient size and/or use of iterative reconstruction technique. COMPARISON:  02/01/2023 FINDINGS: CT HEAD FINDINGS Brain: No evidence of acute infarction, hemorrhage, hydrocephalus, extra-axial collection or mass lesion/mass effect. Vascular: No hyperdense vessel or unexpected calcification. Skull: Normal. Negative for fracture or focal lesion. Sinuses/Orbits: No acute finding. Other: None. CT CERVICAL SPINE FINDINGS Alignment: Degenerative straightening and reversal of the normal cervical lordosis. Skull base and vertebrae: No acute fracture. No primary bone lesion or focal pathologic process. Soft tissues and spinal canal: No prevertebral fluid or swelling. No visible canal hematoma. Disc levels:  Severe multilevel disc degenerative disease osteophytosis throughout the cervical spine. Upper chest: Emphysema. Other: None. IMPRESSION: 1. No acute intracranial pathology. 2. No fracture or static subluxation of the cervical spine. 3. 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 4. Emphysema. Emphysema (ICD10-J43.9). Electronically Signed   By: Marolyn JONETTA Jaksch M.D.   On: 06/22/2023 15:15   CT Cervical Spine Wo Contrast Result Date: 06/22/2023 CLINICAL DATA:  Trauma, fall pain EXAM: CT HEAD WITHOUT CONTRAST CT CERVICAL SPINE WITHOUT CONTRAST TECHNIQUE: Multidetector CT imaging of the head and cervical spine was performed following the standard protocol without intravenous contrast. Multiplanar CT image reconstructions of the cervical spine were also generated. RADIATION DOSE REDUCTION: This exam was performed according to the departmental dose-optimization program which includes automated exposure control, adjustment of the mA and/or kV according to patient size and/or use of iterative reconstruction technique. COMPARISON:  02/01/2023 FINDINGS: CT HEAD FINDINGS  Brain: No evidence of acute infarction, hemorrhage, hydrocephalus, extra-axial collection or mass lesion/mass effect. Vascular: No hyperdense vessel or unexpected calcification. Skull: Normal. Negative for fracture or focal lesion. Sinuses/Orbits: No acute finding. Other: None. CT CERVICAL SPINE FINDINGS Alignment: Degenerative straightening and reversal of the normal cervical lordosis. Skull base and vertebrae: No acute fracture. No primary bone lesion or focal pathologic process. Soft tissues and spinal canal: No prevertebral fluid or swelling. No visible canal hematoma. Disc levels: Severe multilevel disc degenerative disease osteophytosis throughout the cervical spine. Upper chest: Emphysema. Other: None. IMPRESSION: 1. No acute intracranial pathology. 2. No fracture or static subluxation of the cervical spine. 3. 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 4. Emphysema. Emphysema (ICD10-J43.9). Electronically Signed   By: Marolyn JONETTA Jaksch M.D.   On: 06/22/2023 15:15   DG Pelvis 1-2 Views Result Date: 06/22/2023 CLINICAL DATA:  Fall. EXAM: PELVIS - 1-2 VIEW COMPARISON:  Pelvis x-ray 01/01/2017.  Right hip CT 02/01/2023. FINDINGS: The bones are diffusely osteopenic. No acute fracture or dislocation identified. Again seen are severe degenerative changes of the right hip with joint space narrowing, sclerosis and osteophyte formation. There is stable flattening/irregularity of the superior aspect of the right femoral head. Left hip arthroplasty appears uncomplicated. IMPRESSION: 1. No acute fracture or dislocation. 2. Severe degenerative changes of the right hip. Electronically Signed   By: Greig Pique M.D.   On: 06/22/2023 15:09   DG Chest 2 View Result Date: 06/22/2023 CLINICAL DATA:  Fall EXAM: CHEST - 2 VIEW COMPARISON:  Chest x-ray 12/05/2020 FINDINGS: The heart size and mediastinal contours are within normal limits. Both lungs are clear. The visualized  skeletal structures are unremarkable. IMPRESSION: No active cardiopulmonary disease. Electronically Signed   By: Greig Pique M.D.   On: 06/22/2023 15:06    Procedures Procedures    Medications Ordered in ED Medications  oxyCODONE  (Oxy IR/ROXICODONE ) immediate release tablet 5 mg (5 mg Oral Given 06/22/23 1348)  HYDROcodone -acetaminophen  (NORCO/VICODIN) 5-325 MG per tablet 2 tablet (2 tablets Oral Given 06/22/23 2020)    ED Course/ Medical Decision Making/ A&P                                 Medical Decision Making She reports she tripped and fell striking her head her chest and her right hip.  Amount and/or Complexity of Data Reviewed Independent Historian: EMS    Details: Patient was brought in by EMS after falling at home External Data Reviewed: notes.  Details: Orthopedist notes reviewed patient had surgery canceled by provider due to repeated positive UDS. Labs: ordered.    Details: Labs ordered reviewed and interpreted CBC and be met are unremarkable Radiology: ordered and independent interpretation performed. Decision-making details documented in ED Course.    Details: X-rays ordered reviewed and interpreted CT scan head and CT cervical spine show no acute findings chest x-ray no acute findings right hip shows degenerative joint disease.  Risk Prescription drug management. Risk Details: Patient given medication for pain.  She is able to stand and ambulate with her walker.  Patient is advised to schedule follow-up with her primary care physician.           Final Clinical Impression(s) / ED Diagnoses Final diagnoses:  Fall, initial encounter  Contusion of right hip, initial encounter  Contusion of head, unspecified part of head, initial encounter    Rx / DC Orders ED Discharge Orders          Ordered    HYDROcodone -acetaminophen  (NORCO/VICODIN) 5-325 MG tablet  Every 4 hours PRN        06/22/23 2100          An After Visit Summary was printed and given  to the patient.     Tadd Holtmeyer K, PA-C 06/22/23 2110    Freddi Hamilton, MD 06/22/23 2121

## 2023-07-02 ENCOUNTER — Ambulatory Visit (INDEPENDENT_AMBULATORY_CARE_PROVIDER_SITE_OTHER): Payer: 59 | Admitting: Orthopaedic Surgery

## 2023-07-02 DIAGNOSIS — M1611 Unilateral primary osteoarthritis, right hip: Secondary | ICD-10-CM | POA: Diagnosis not present

## 2023-07-02 NOTE — Progress Notes (Signed)
Office Visit Note   Patient: Terry Potter           Date of Birth: 04/12/1956           MRN: [id number] Visit Date: 07/02/2023              Requested by: Loura Back, NP [address] [place],  [place] [address] PCP: Loura Back, NP   Assessment & Plan: Visit Diagnoses:  1. Primary osteoarthritis of right hip     Plan: Venola has severe DJD of the right hip with a collapsed femoral head and cystic changes.  Recently had a fall because of it.  Fortunately the x-rays were negative for acute abnormalities..  At this point she would like to be placed back on the schedule for surgery.  Questions encouraged and answered.  Follow-Up Instructions: No follow-ups on file.   Orders:  No orders of the defined types were placed in this encounter.  No orders of the defined types were placed in this encounter.     Procedures: No procedures performed   Clinical Data: No additional findings.   Subjective: Chief Complaint  Patient presents with   Right Hip - Pain    HPI Caylei returns today for follow-up evaluation of advanced right hip DJD.  She states that she has made some changes in her life and has made a commitment to abstaining from drugs.  Briefly she has been canceled twice for surgery due to positive UDS for cocaine. Review of Systems  Constitutional: Negative.   HENT: Negative.    Eyes: Negative.   Respiratory: Negative.    Cardiovascular: Negative.   Endocrine: Negative.   Musculoskeletal:  Positive for arthralgias and gait problem.  Hematological: Negative.   Psychiatric/Behavioral: Negative.    All other systems reviewed and are negative.    Objective: Vital Signs: There were no vitals taken for this visit.  Physical Exam Vitals and nursing note reviewed.  Constitutional:      Appearance: She is well-developed.  HENT:     Head: Atraumatic.     Nose: Nose normal.  Eyes:     Extraocular Movements: Extraocular movements intact.  Cardiovascular:      Pulses: Normal pulses.  Pulmonary:     Effort: Pulmonary effort is normal.  Abdominal:     Palpations: Abdomen is soft.  Musculoskeletal:     Cervical back: Neck supple.  Skin:    General: Skin is warm.     Capillary Refill: Capillary refill takes less than 2 seconds.  Neurological:     Mental Status: She is alert. Mental status is at baseline.  Psychiatric:        Behavior: Behavior normal.        Thought Content: Thought content normal.        Judgment: Judgment normal.     Ortho Exam Emanation of right hip shows severe pain with range of motion.  Antalgic gait.  Uses a rollator. Specialty Comments:  No specialty comments available.  Imaging: No results found.   PMFS History: Patient Active Problem List   Diagnosis Date Noted   Primary osteoarthritis of right hip 08/19/2022   Grief 01/23/2021   PTSD (post-traumatic stress disorder) 08/13/2020   Cocaine use disorder, severe, in controlled environment (HCC) 07/23/2020   Cocaine abuse with cocaine-induced mood disorder (HCC) 07/23/2020   Bacterial vaginosis 02/01/2018   Cocaine abuse (HCC) 02/01/2018   Pulmonary nodules 02/01/2018   Syncope 02/01/2018   Schizoaffective disorder, bipolar type (HCC) 03/19/2015  Class: Chronic   Past Medical History:  Diagnosis Date   Abnormal EKG    Anxiety    Arthritis    Asthma    Bipolar disorder (HCC)    Depression    Ectopic pregnancy    GERD (gastroesophageal reflux disease)    Hip pain    Marijuana use    Osteoarthritis of right hip    Protein-calorie malnutrition, severe (HCC)    Shoulder pain    Tobacco dependence    cocaine ,Marijuana    Family History  Problem Relation Age of Onset   Cancer Mother     Past Surgical History:  Procedure Laterality Date   LAPAROSCOPY FOR ECTOPIC PREGNANCY     TONSILLECTOMY     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   Procedure: TOTAL HIP ARTHROPLASTY ANTERIOR APPROACH;  Surgeon: Samson Frederic, MD;  Location: [facility] OR;   Service: Orthopedics;  Laterality: Left;   TUMOR REMOVAL  1990   Uterine tumor removed   Social History   Occupational History   Not on file  Tobacco Use   Smoking status: Every Day    Current packs/day: 0.50    Average packs/day: 0.5 packs/day for 40.0 years (20.0 ttl pk-yrs)    Types: Cigars, Cigarettes   Smokeless tobacco: Never   Tobacco comments:    8 cigars a day (quit smoking cigarettes in 1998)  Vaping Use   Vaping status: Never Used  Substance and Sexual Activity   Alcohol use: Yes    Alcohol/week: 3.0 - 4.0 standard drinks of alcohol    Types: 3 - 4 Shots of liquor per week    Comment: weekly   Drug use: Not Currently    Types: Marijuana, Cocaine    Comment: has not smoked marijuana "in a long time" and has not used cocaine in "over 2 weeks" 03/17/23   Sexual activity: Not on file

## 2023-07-26 ENCOUNTER — Other Ambulatory Visit: Payer: Self-pay

## 2023-07-26 ENCOUNTER — Other Ambulatory Visit: Payer: Self-pay | Admitting: Physician Assistant

## 2023-07-26 MED ORDER — ASPIRIN 81 MG PO CHEW: 81.0000 mg | CHEWABLE_TABLET | Freq: Two times a day (BID) | ORAL | 0 refills | Status: AC

## 2023-07-26 MED ORDER — DOCUSATE SODIUM 100 MG PO CAPS: 100.0000 mg | ORAL_CAPSULE | Freq: Every day | ORAL | 2 refills | Status: AC | PRN

## 2023-07-26 MED ORDER — ONDANSETRON HCL 4 MG PO TABS: 4.0000 mg | ORAL_TABLET | Freq: Three times a day (TID) | ORAL | 0 refills | Status: AC | PRN

## 2023-07-26 NOTE — Pre-Procedure Instructions (Signed)
 Surgical Instructions   Your procedure is scheduled on August 02, 2023. Report to [facility] Main Entrance A at 10:15 A.M., then check in with the Admitting office. Any questions or running late day of surgery: call [phone]  Questions prior to your surgery date: call [phone], Monday-Friday, 8am-4pm. If you experience any cold or flu symptoms such as cough, fever, chills, shortness of breath, etc. between now and your scheduled surgery, please notify us  at the above number.     Remember:  Do not eat after midnight the night before your surgery  You may drink clear liquids until 9:45 AM the morning of your surgery.   Clear liquids allowed are: Water, Non-Citrus Juices (without pulp), Carbonated Beverages, Clear Tea (no milk, honey, etc.), Black Coffee Only (NO MILK, CREAM OR POWDERED CREAMER of any kind), and Gatorade.  Patient Instructions  The night before surgery:  No food after midnight. ONLY clear liquids after midnight  The day of surgery (if you do NOT have diabetes):  Drink ONE (1) Pre-Surgery Clear Ensure by 8:45 AM the morning of surgery. Drink in one sitting. Do not sip.  This drink was given to you during your hospital  pre-op appointment visit.  Nothing else to drink after completing the  Pre-Surgery Clear Ensure.         If you have questions, please contact your surgeons office.    Take these medicines the morning of surgery with A SIP OF WATER: acetaminophen  (TYLENOL )  fluticasone -salmeterol (ADVAIR) inhaler gabapentin  (NEURONTIN )  HYDROcodone -acetaminophen  (NORCO/VICODIN)  omeprazole (PRILOSEC)  Oxcarbazepine  (TRILEPTAL )  prazosin  (MINIPRESS )  risperiDONE  (RISPERDAL )    May take these medicines IF NEEDED: fluticasone  (FLONASE ) nasal spray  hydrOXYzine  (ATARAX )  ondansetron  (ZOFRAN )    Follow your surgeon's instructions on when to stop Aspirin .  If no instructions were given by your surgeon then you will need to call the office to get  those instructions.     One week prior to surgery, STOP taking any Aleve , Naproxen , Ibuprofen , Motrin , Advil , Goody's, BC's, all herbal medications, fish oil, and non-prescription vitamins.                     Do NOT Smoke (Tobacco/Vaping) for 24 hours prior to your procedure.  If you use a CPAP at night, you may bring your mask/headgear for your overnight stay.   You will be asked to remove any contacts, glasses, piercing's, hearing aid's, dentures/partials prior to surgery. Please bring cases for these items if needed.    Patients discharged the day of surgery will not be allowed to drive home, and someone needs to stay with them for 24 hours.  SURGICAL WAITING ROOM VISITATION Patients may have no more than 2 support people in the waiting area - these visitors may rotate.   Pre-op nurse will coordinate an appropriate time for 1 ADULT support person, who may not rotate, to accompany patient in pre-op.  Children under the age of 53 must have an adult with them who is not the patient and must remain in the main waiting area with an adult.  If the patient needs to stay at the hospital during part of their recovery, the visitor guidelines for inpatient rooms apply.  Please refer to the [facility] website for the visitor guidelines for any additional information.   If you received a COVID test during your pre-op visit  it is requested that you wear a mask when out in public, stay away from anyone that may not be  feeling well and notify your surgeon if you develop symptoms. If you have been in contact with anyone that has tested positive in the last 10 days please notify you surgeon.      Pre-operative 5 CHG Bathing Instructions   You can play a key role in reducing the risk of infection after surgery. Your skin needs to be as free of germs as possible. You can reduce the number of germs on your skin by washing with CHG (chlorhexidine  gluconate) soap before surgery. CHG is an antiseptic  soap that kills germs and continues to kill germs even after washing.   DO NOT use if you have an allergy to chlorhexidine /CHG or antibacterial soaps. If your skin becomes reddened or irritated, stop using the CHG and notify one of our RNs at [phone].   Please shower with the CHG soap starting 4 days before surgery using the following schedule:     Please keep in mind the following:  DO NOT shave, including legs and underarms, starting the day of your first shower.   You may shave your face at any point before/day of surgery.  Place clean sheets on your bed the day you start using CHG soap. Use a clean washcloth (not used since being washed) for each shower. DO NOT sleep with pets once you start using the CHG.   CHG Shower Instructions:  Wash your face and private area with normal soap. If you choose to wash your hair, wash first with your normal shampoo.  After you use shampoo/soap, rinse your hair and body thoroughly to remove shampoo/soap residue.  Turn the water OFF and apply about 3 tablespoons (45 ml) of CHG soap to a CLEAN washcloth.  Apply CHG soap ONLY FROM YOUR NECK DOWN TO YOUR TOES (washing for 3-5 minutes)  DO NOT use CHG soap on face, private areas, open wounds, or sores.  Pay special attention to the area where your surgery is being performed.  If you are having back surgery, having someone wash your back for you may be helpful. Wait 2 minutes after CHG soap is applied, then you may rinse off the CHG soap.  Pat dry with a clean towel  Put on clean clothes/pajamas   If you choose to wear lotion, please use ONLY the CHG-compatible lotions that are listed below.  Additional instructions for the day of surgery: DO NOT APPLY any lotions, deodorants, cologne, or perfumes.   Do not bring valuables to the hospital. [facility] is not responsible for any belongings/valuables. Do not wear nail polish, gel polish, artificial nails, or any other type of covering on natural  nails (fingers and toes) Do not wear jewelry or makeup Put on clean/comfortable clothes.  Please brush your teeth.  Ask your nurse before applying any prescription medications to the skin.     CHG Compatible Lotions   Aveeno Moisturizing lotion  Cetaphil Moisturizing Cream  Cetaphil Moisturizing Lotion  Clairol Herbal Essence Moisturizing Lotion, Dry Skin  Clairol Herbal Essence Moisturizing Lotion, Extra Dry Skin  Clairol Herbal Essence Moisturizing Lotion, Normal Skin  Curel Age Defying Therapeutic Moisturizing Lotion with Alpha Hydroxy  Curel Extreme Care Body Lotion  Curel Soothing Hands Moisturizing Hand Lotion  Curel Therapeutic Moisturizing Cream, Fragrance-Free  Curel Therapeutic Moisturizing Lotion, Fragrance-Free  Curel Therapeutic Moisturizing Lotion, Original Formula  Eucerin Daily Replenishing Lotion  Eucerin Dry Skin Therapy Plus Alpha Hydroxy Crme  Eucerin Dry Skin Therapy Plus Alpha Hydroxy Lotion  Eucerin Original Crme  Eucerin Original Lotion  Eucerin Plus Crme Eucerin Plus Lotion  Eucerin TriLipid Replenishing Lotion  Keri Anti-Bacterial Hand Lotion  Keri Deep Conditioning Original Lotion Dry Skin Formula Softly Scented  Keri Deep Conditioning Original Lotion, Fragrance Free Sensitive Skin Formula  Keri Lotion Fast Absorbing Fragrance Free Sensitive Skin Formula  Keri Lotion Fast Absorbing Softly Scented Dry Skin Formula  Keri Original Lotion  Keri Skin Renewal Lotion Keri Silky Smooth Lotion  Keri Silky Smooth Sensitive Skin Lotion  Nivea Body Creamy Conditioning Oil  Nivea Body Extra Enriched Lotion  Nivea Body Original Lotion  Nivea Body Sheer Moisturizing Lotion Nivea Crme  Nivea Skin Firming Lotion  NutraDerm 30 Skin Lotion  NutraDerm Skin Lotion  NutraDerm Therapeutic Skin Cream  NutraDerm Therapeutic Skin Lotion  ProShield Protective Hand Cream  Provon moisturizing lotion  Please read over the following fact sheets that you were  given.

## 2023-07-27 ENCOUNTER — Other Ambulatory Visit (HOSPITAL_COMMUNITY): Payer: 59

## 2023-07-28 ENCOUNTER — Encounter (HOSPITAL_COMMUNITY)
Admission: RE | Admit: 2023-07-28 | Discharge: 2023-07-28 | Disposition: A | Discharge status: Home / Self Care | Payer: 59 | Source: Ambulatory Visit | Attending: Orthopaedic Surgery | Admitting: Orthopaedic Surgery

## 2023-07-28 ENCOUNTER — Encounter (HOSPITAL_COMMUNITY): Payer: Self-pay

## 2023-07-28 ENCOUNTER — Other Ambulatory Visit: Payer: Self-pay

## 2023-07-28 VITALS — BP 122/66 | HR 79 | Temp 97.8°F | Resp 18 | Ht 64.0 in | Wt 167.0 lb

## 2023-07-28 DIAGNOSIS — F1410 Cocaine abuse, uncomplicated: Secondary | ICD-10-CM | POA: Diagnosis not present

## 2023-07-28 DIAGNOSIS — M1611 Unilateral primary osteoarthritis, right hip: Secondary | ICD-10-CM | POA: Diagnosis not present

## 2023-07-28 DIAGNOSIS — Z01812 Encounter for preprocedural laboratory examination: Secondary | ICD-10-CM | POA: Diagnosis present

## 2023-07-28 DIAGNOSIS — Z01818 Encounter for other preprocedural examination: Secondary | ICD-10-CM

## 2023-07-28 LAB — CBC
HCT: 39.9 % (ref 36.0–46.0)
Hemoglobin: 13 g/dL (ref 12.0–15.0)
MCH: 31.7 pg (ref 26.0–34.0)
MCHC: 32.6 g/dL (ref 30.0–36.0)
MCV: 97.3 fL (ref 80.0–100.0)
Platelets: 170 10*3/uL (ref 150–400)
RBC: 4.1 MIL/uL (ref 3.87–5.11)
RDW: 14.6 % (ref 11.5–15.5)
WBC: 6.3 10*3/uL (ref 4.0–10.5)
nRBC: 0 % (ref 0.0–0.2)

## 2023-07-28 LAB — TYPE AND SCREEN
ABO/RH(D): A POS
Antibody Screen: NEGATIVE

## 2023-07-28 LAB — BASIC METABOLIC PANEL
Anion gap: 13 (ref 5–15)
BUN: 22 mg/dL (ref 8–23)
CO2: 25 mmol/L (ref 22–32)
Calcium: 9.1 mg/dL (ref 8.9–10.3)
Chloride: 101 mmol/L (ref 98–111)
Creatinine, Ser: 0.76 mg/dL (ref 0.44–1.00)
GFR, Estimated: >60 mL/min (ref >60)
Glucose, Bld: 77 mg/dL (ref 70–99)
Potassium: 4 mmol/L (ref 3.5–5.1)
Sodium: 139 mmol/L (ref 135–145)

## 2023-07-28 LAB — SURGICAL PCR SCREEN
MRSA, PCR: NEGATIVE
Staphylococcus aureus: NEGATIVE

## 2023-07-28 LAB — RAPID URINE DRUG SCREEN, HOSP PERFORMED
Amphetamines: NOT DETECTED
Barbiturates: NOT DETECTED
Benzodiazepines: NOT DETECTED
Cocaine: POSITIVE — AB
Opiates: NOT DETECTED
Tetrahydrocannabinol: NOT DETECTED

## 2023-07-28 NOTE — Progress Notes (Signed)
Positive for cocaine again.  This is third time.  Please cancel surgery and discharge from practice.  Thank you.

## 2023-07-28 NOTE — Pre-Procedure Instructions (Signed)
Surgical Instructions   Your procedure is scheduled on August 02, 2023. Report to [facility] Main Entrance "A" at 10:15 A.M., then check in with the Admitting office. Any questions or running late day of surgery: call [phone]  Questions prior to your surgery date: call [phone], Monday-Friday, 8am-4pm. If you experience any cold or flu symptoms such as cough, fever, chills, shortness of breath, etc. between now and your scheduled surgery, please notify us at the above number.     Remember:  Do not eat after midnight the night before your surgery  You may drink clear liquids until 9:45 AM the morning of your surgery.   Clear liquids allowed are: Water, Non-Citrus Juices (without pulp), Carbonated Beverages, Clear Tea (no milk, honey, etc.), Black Coffee Only (NO MILK, CREAM OR POWDERED CREAMER of any kind), and Gatorade.  Patient Instructions  The night before surgery:  No food after midnight. ONLY clear liquids after midnight  The day of surgery (if you do NOT have diabetes):  Drink ONE (1) Pre-Surgery Clear Ensure by 9:45 AM the morning of surgery. Drink in one sitting. Do not sip.  This drink was given to you during your hospital  pre-op appointment visit.  Nothing else to drink after completing the  Pre-Surgery Clear Ensure.         If you have questions, please contact your surgeon's office.    Take these medicines the morning of surgery with A SIP OF WATER: acetaminophen (TYLENOL)  fluticasone-salmeterol (ADVAIR) inhaler gabapentin (NEURONTIN)  HYDROcodone-acetaminophen (NORCO/VICODIN)  omeprazole (PRILOSEC)  Oxcarbazepine (TRILEPTAL)  prazosin (MINIPRESS)  risperiDONE (RISPERDAL)    May take these medicines IF NEEDED: fluticasone (FLONASE) nasal spray  hydrOXYzine (ATARAX)  ondansetron (ZOFRAN)    Follow your surgeon's instructions on when to stop Aspirin.  If no instructions were given by your surgeon then you will need to call the office to get  those instructions.     One week prior to surgery, STOP taking any Aleve, Naproxen, Ibuprofen, Motrin, Advil, Goody's, BC's, all herbal medications, fish oil, and non-prescription vitamins.                     Do NOT Smoke (Tobacco/Vaping) for 24 hours prior to your procedure.  If you use a CPAP at night, you may bring your mask/headgear for your overnight stay.   You will be asked to remove any contacts, glasses, piercing's, hearing aid's, dentures/partials prior to surgery. Please bring cases for these items if needed.    Patients discharged the day of surgery will not be allowed to drive home, and someone needs to stay with them for 24 hours.  SURGICAL WAITING ROOM VISITATION Patients may have no more than 2 support people in the waiting area - these visitors may rotate.   Pre-op nurse will coordinate an appropriate time for 1 ADULT support person, who may not rotate, to accompany patient in pre-op.  Children under the age of 61 must have an adult with them who is not the patient and must remain in the main waiting area with an adult.  If the patient needs to stay at the hospital during part of their recovery, the visitor guidelines for inpatient rooms apply.  Please refer to the [facility] website for the visitor guidelines for any additional information.   If you received a COVID test during your pre-op visit  it is requested that you wear a mask when out in public, stay away from anyone that may not be  feeling well and notify your surgeon if you develop symptoms. If you have been in contact with anyone that has tested positive in the last 10 days please notify you surgeon.      Pre-operative 5 CHG Bathing Instructions   You can play a key role in reducing the risk of infection after surgery. Your skin needs to be as free of germs as possible. You can reduce the number of germs on your skin by washing with CHG (chlorhexidine gluconate) soap before surgery. CHG is an antiseptic  soap that kills germs and continues to kill germs even after washing.   DO NOT use if you have an allergy to chlorhexidine/CHG or antibacterial soaps. If your skin becomes reddened or irritated, stop using the CHG and notify one of our RNs at [phone].   Please shower with the CHG soap starting 4 days before surgery using the following schedule:     Please keep in mind the following:  DO NOT shave, including legs and underarms, starting the day of your first shower.   You may shave your face at any point before/day of surgery.  Place clean sheets on your bed the day you start using CHG soap. Use a clean washcloth (not used since being washed) for each shower. DO NOT sleep with pets once you start using the CHG.   CHG Shower Instructions:  Wash your face and private area with normal soap. If you choose to wash your hair, wash first with your normal shampoo.  After you use shampoo/soap, rinse your hair and body thoroughly to remove shampoo/soap residue.  Turn the water OFF and apply about 3 tablespoons (45 ml) of CHG soap to a CLEAN washcloth.  Apply CHG soap ONLY FROM YOUR NECK DOWN TO YOUR TOES (washing for 3-5 minutes)  DO NOT use CHG soap on face, private areas, open wounds, or sores.  Pay special attention to the area where your surgery is being performed.  If you are having back surgery, having someone wash your back for you may be helpful. Wait 2 minutes after CHG soap is applied, then you may rinse off the CHG soap.  Pat dry with a clean towel  Put on clean clothes/pajamas   If you choose to wear lotion, please use ONLY the CHG-compatible lotions that are listed below.  Additional instructions for the day of surgery: DO NOT APPLY any lotions, deodorants, cologne, or perfumes.   Do not bring valuables to the hospital. [facility] is not responsible for any belongings/valuables. Do not wear nail polish, gel polish, artificial nails, or any other type of covering on natural  nails (fingers and toes) Do not wear jewelry or makeup Put on clean/comfortable clothes.  Please brush your teeth.  Ask your nurse before applying any prescription medications to the skin.     CHG Compatible Lotions   Aveeno Moisturizing lotion  Cetaphil Moisturizing Cream  Cetaphil Moisturizing Lotion  Clairol Herbal Essence Moisturizing Lotion, Dry Skin  Clairol Herbal Essence Moisturizing Lotion, Extra Dry Skin  Clairol Herbal Essence Moisturizing Lotion, Normal Skin  Curel Age Defying Therapeutic Moisturizing Lotion with Alpha Hydroxy  Curel Extreme Care Body Lotion  Curel Soothing Hands Moisturizing Hand Lotion  Curel Therapeutic Moisturizing Cream, Fragrance-Free  Curel Therapeutic Moisturizing Lotion, Fragrance-Free  Curel Therapeutic Moisturizing Lotion, Original Formula  Eucerin Daily Replenishing Lotion  Eucerin Dry Skin Therapy Plus Alpha Hydroxy Crme  Eucerin Dry Skin Therapy Plus Alpha Hydroxy Lotion  Eucerin Original Crme  Eucerin Original Lotion  Eucerin Plus Crme Eucerin Plus Lotion  Eucerin TriLipid Replenishing Lotion  Keri Anti-Bacterial Hand Lotion  Keri Deep Conditioning Original Lotion Dry Skin Formula Softly Scented  Keri Deep Conditioning Original Lotion, Fragrance Free Sensitive Skin Formula  Keri Lotion Fast Absorbing Fragrance Free Sensitive Skin Formula  Keri Lotion Fast Absorbing Softly Scented Dry Skin Formula  Keri Original Lotion  Keri Skin Renewal Lotion Keri Silky Smooth Lotion  Keri Silky Smooth Sensitive Skin Lotion  Nivea Body Creamy Conditioning Oil  Nivea Body Extra Enriched Lotion  Nivea Body Original Lotion  Nivea Body Sheer Moisturizing Lotion Nivea Crme  Nivea Skin Firming Lotion  NutraDerm 30 Skin Lotion  NutraDerm Skin Lotion  NutraDerm Therapeutic Skin Cream  NutraDerm Therapeutic Skin Lotion  ProShield Protective Hand Cream  Provon moisturizing lotion  Please read over the following fact sheets that you were  given.

## 2023-07-28 NOTE — Progress Notes (Signed)
PCP - Loura Back, NP Cardiologist - Pt was evaluated by Dr. Anne Fu in 2023 (pre-op) and just directed to f/u as needed.  PPM/ICD - denies  Chest x-ray - 06/22/23 EKG - 06/22/23 Stress Test - 05/28/22 ECHO - 05/28/22 Cardiac Cath - denies  Sleep Study - denies   DM- denies  Last dose of GLP1 agonist-  n/a   Blood Thinner Instructions: n/a Aspirin Instructions: f/u with surgeon  ERAS Protcol - yes, clears until 0945 PRE-SURGERY Ensure given at PAT  COVID TEST- n/a   Anesthesia review: yes, previously evaluated by cardiology in 2023. Fayrene Fearing, [place], wrote previous note prior to surgery being cancelled due to another positive UDS.  Patient denies shortness of breath, fever, cough and chest pain at PAT appointment   All instructions explained to the patient, with a verbal understanding of the material. Patient agrees to go over the instructions while at home for a better understanding.  The opportunity to ask questions was provided.

## 2023-07-30 ENCOUNTER — Encounter: Payer: Self-pay | Admitting: Orthopaedic Surgery

## 2023-07-30 ENCOUNTER — Telehealth: Payer: Self-pay | Admitting: Orthopaedic Surgery

## 2023-07-30 NOTE — Telephone Encounter (Signed)
Left message on patient's voicemail letting her know the hip surgery with Dr Roda Shutters on 08-02-23 (Monday) has been cancelled due to the results of her test.  Left my name and direct number should the patient need to speak with me.

## 2023-08-02 ENCOUNTER — Ambulatory Visit (HOSPITAL_COMMUNITY): Admission: RE | Admit: 2023-08-02 | Payer: 59 | Source: Home / Self Care | Admitting: Orthopaedic Surgery

## 2023-08-02 ENCOUNTER — Encounter (HOSPITAL_COMMUNITY): Admission: RE | Payer: Self-pay | Source: Home / Self Care

## 2023-08-02 DIAGNOSIS — F1410 Cocaine abuse, uncomplicated: Secondary | ICD-10-CM

## 2023-08-02 DIAGNOSIS — F1414 Cocaine abuse with cocaine-induced mood disorder: Secondary | ICD-10-CM

## 2023-08-02 DIAGNOSIS — M1611 Unilateral primary osteoarthritis, right hip: Secondary | ICD-10-CM

## 2023-08-02 SURGERY — ARTHROPLASTY, HIP, TOTAL, ANTERIOR APPROACH
Anesthesia: Spinal | Site: Hip | Laterality: Right

## 2023-08-17 ENCOUNTER — Encounter: Payer: 59 | Admitting: Physician Assistant

## 2023-08-27 ENCOUNTER — Other Ambulatory Visit: Payer: Self-pay | Admitting: Registered Nurse

## 2023-08-27 DIAGNOSIS — E2839 Other primary ovarian failure: Secondary | ICD-10-CM

## 2024-04-26 ENCOUNTER — Ambulatory Visit (HOSPITAL_COMMUNITY): Admitting: Licensed Clinical Social Worker

## 2024-04-26 DIAGNOSIS — F1021 Alcohol dependence, in remission: Secondary | ICD-10-CM | POA: Insufficient documentation

## 2024-04-26 DIAGNOSIS — F1414 Cocaine abuse with cocaine-induced mood disorder: Secondary | ICD-10-CM

## 2024-04-26 DIAGNOSIS — F250 Schizoaffective disorder, bipolar type: Secondary | ICD-10-CM
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
## 2024-04-27 ENCOUNTER — Telehealth (HOSPITAL_COMMUNITY): Payer: Self-pay

## 2024-04-27 NOTE — Telephone Encounter (Signed)
 Juliene Cong referred this pt to [name].  Therapist calls to attempt to get her scheduled to come in for an evaluation to see if she meets criteria.  Therapist reaches VM and leaves a HIPAA compliant message requesting a return call.  Darice Simpler, MS, LMFT, LCAS

## 2024-05-09 ENCOUNTER — Other Ambulatory Visit

## 2024-06-01 ENCOUNTER — Ambulatory Visit (HOSPITAL_COMMUNITY)

## 2024-06-14 ENCOUNTER — Other Ambulatory Visit (HOSPITAL_BASED_OUTPATIENT_CLINIC_OR_DEPARTMENT_OTHER)
